# Patient Record
Sex: Female | Born: 1950 | Race: White | Hispanic: No | Marital: Married | Smoking: Never smoker
Health system: Southern US, Community
[De-identification: ages and names within clinical notes are randomized; demographics above are authoritative.]

## PROBLEM LIST (undated history)

## (undated) DIAGNOSIS — M1990 Unspecified osteoarthritis, unspecified site: Secondary | ICD-10-CM

## (undated) DIAGNOSIS — K219 Gastro-esophageal reflux disease without esophagitis: Secondary | ICD-10-CM

## (undated) DIAGNOSIS — I2510 Atherosclerotic heart disease of native coronary artery without angina pectoris: Secondary | ICD-10-CM

## (undated) DIAGNOSIS — E039 Hypothyroidism, unspecified: Secondary | ICD-10-CM

## (undated) DIAGNOSIS — T7840XA Allergy, unspecified, initial encounter: Secondary | ICD-10-CM

## (undated) DIAGNOSIS — Z923 Personal history of irradiation: Secondary | ICD-10-CM

## (undated) DIAGNOSIS — I219 Acute myocardial infarction, unspecified: Secondary | ICD-10-CM

## (undated) DIAGNOSIS — C50919 Malignant neoplasm of unspecified site of unspecified female breast: Secondary | ICD-10-CM

## (undated) DIAGNOSIS — E785 Hyperlipidemia, unspecified: Secondary | ICD-10-CM

## (undated) DIAGNOSIS — I10 Essential (primary) hypertension: Secondary | ICD-10-CM

## (undated) HISTORY — DX: Gastro-esophageal reflux disease without esophagitis: K21.9

## (undated) HISTORY — PX: CARDIAC CATHETERIZATION: SHX172

## (undated) HISTORY — DX: Acute myocardial infarction, unspecified: I21.9

## (undated) HISTORY — DX: Hyperlipidemia, unspecified: E78.5

## (undated) HISTORY — DX: Morbid (severe) obesity due to excess calories: E66.01

## (undated) HISTORY — DX: Atherosclerotic heart disease of native coronary artery without angina pectoris: I25.10

## (undated) HISTORY — PX: HAND SURGERY: SHX662

## (undated) HISTORY — DX: Allergy, unspecified, initial encounter: T78.40XA

## (undated) HISTORY — DX: Essential (primary) hypertension: I10

---

## 1998-07-11 DIAGNOSIS — I219 Acute myocardial infarction, unspecified: Secondary | ICD-10-CM

## 1998-07-11 HISTORY — DX: Acute myocardial infarction, unspecified: I21.9

## 2004-06-10 HISTORY — PX: COLONOSCOPY: SHX174

## 2004-08-31 ENCOUNTER — Ambulatory Visit: Payer: Self-pay | Admitting: Internal Medicine

## 2005-06-11 ENCOUNTER — Other Ambulatory Visit: Payer: Self-pay

## 2005-06-13 ENCOUNTER — Ambulatory Visit: Payer: Self-pay | Admitting: Specialist

## 2006-10-24 ENCOUNTER — Ambulatory Visit: Payer: Self-pay | Admitting: Gastroenterology

## 2008-06-15 ENCOUNTER — Ambulatory Visit: Payer: Self-pay | Admitting: Specialist

## 2008-07-18 ENCOUNTER — Encounter: Payer: Self-pay | Admitting: Specialist

## 2010-02-05 ENCOUNTER — Ambulatory Visit: Payer: Self-pay | Admitting: Family Medicine

## 2011-02-19 ENCOUNTER — Ambulatory Visit: Payer: Self-pay | Admitting: Family Medicine

## 2012-02-21 ENCOUNTER — Ambulatory Visit: Payer: Self-pay | Admitting: Family Medicine

## 2012-06-10 HISTORY — PX: BREAST LUMPECTOMY WITH MAMMOSITE CAVITY EVALUATION DEVICE: SHX5596

## 2013-02-24 ENCOUNTER — Ambulatory Visit: Payer: Self-pay | Admitting: Family Medicine

## 2013-03-16 ENCOUNTER — Ambulatory Visit: Payer: Self-pay | Admitting: Family Medicine

## 2013-03-22 ENCOUNTER — Ambulatory Visit: Payer: Self-pay | Admitting: Family Medicine

## 2013-03-22 HISTORY — PX: BREAST BIOPSY: SHX20

## 2013-03-29 ENCOUNTER — Ambulatory Visit (INDEPENDENT_AMBULATORY_CARE_PROVIDER_SITE_OTHER): Payer: 59 | Admitting: General Surgery

## 2013-03-29 ENCOUNTER — Other Ambulatory Visit: Payer: 59

## 2013-03-29 ENCOUNTER — Encounter: Payer: Self-pay | Admitting: General Surgery

## 2013-03-29 VITALS — BP 132/78 | HR 72 | Resp 14 | Ht 62.0 in | Wt 228.0 lb

## 2013-03-29 DIAGNOSIS — D0512 Intraductal carcinoma in situ of left breast: Secondary | ICD-10-CM

## 2013-03-29 DIAGNOSIS — D0590 Unspecified type of carcinoma in situ of unspecified breast: Secondary | ICD-10-CM

## 2013-03-29 NOTE — Progress Notes (Signed)
Patient ID: Annette Porter, female   DOB: May 22, 1951, 62 y.o.   MRN: [id number]  Chief Complaint  Patient presents with  . Follow-up    mammogram    HPI Annette Porter is a 62 y.o. female who presents for a breast evaluation. The most recent mammogram was done on 02/20/13. 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 Patient does perform regular self breast checks and gets regular mammograms done. Patient reports no prior breast problems. She denies feeling any lumps or tenderness. No family history of breast cancer. The patient had been notified of the pathology results by the radiologist.  The patient is accompanied today by her sister, Holly Bodily who was present for the interview and exam.  HPI   Past Medical History  Diagnosis Date  . Myocardial infarction 07/1998    Past Surgical History  Procedure Laterality Date  . Breast biopsy Left 03/22/13     stereo    No family history on file.  Social History History  Substance Use Topics  . Smoking status: Never Smoker   . Smokeless tobacco: Never Used  . Alcohol Use: No    No Known Allergies  Current Outpatient Prescriptions  Medication Sig Dispense Refill  . aspirin 81 MG tablet Take 81 mg by mouth daily.      . Cholecalciferol (VITAMIN D3) 1000 UNITS CAPS Take 1 capsule by mouth daily.      Marland Kitchen docusate sodium (COLACE) 100 MG capsule Take 100 mg by mouth daily.      Marland Kitchen ibuprofen (ADVIL,MOTRIN) 800 MG tablet Take 800 mg by mouth every 8 (eight) hours as needed for pain.      . isosorbide mononitrate (IMDUR) 60 MG 24 hr tablet Take 1 tablet by mouth daily.      . metoprolol succinate (TOPROL-XL) 50 MG 24 hr tablet Take 1 tablet by mouth daily.      . niacin (NIASPAN) 1000 MG CR tablet Take 1,000 mg by mouth at bedtime.      . nitroGLYCERIN (NITROSTAT) 0.4 MG SL tablet Place 0.4 mg under the tongue every 5 (five) minutes as needed for chest pain.      . ranitidine (ZANTAC) 75 MG tablet  Take 75 mg by mouth at bedtime.      . senna-docusate (STOOL SOFTENER & LAXATIVE) 8.6-50 MG per tablet Take 1 tablet by mouth daily.      Marland Kitchen SYNTHROID 125 MCG tablet Take 1 tablet by mouth daily.      . vitamin B-12 (CYANOCOBALAMIN) 250 MCG tablet Take 250 mcg by mouth daily.      Marland Kitchen ZETIA 10 MG tablet Take 1 tablet by mouth daily.      . CRESTOR 40 MG tablet Take 1 tablet by mouth daily.       No current facility-administered medications for this visit.    Review of Systems Review of Systems  Constitutional: Negative.   Respiratory: Negative.   Cardiovascular: Negative.     Blood pressure 132/78, pulse 72, resp. rate 14, height 5\' 2"  (1.575 m), weight 228 lb (103.42 kg).  Physical Exam Physical Exam  Constitutional: She is oriented to person, place, and time. She appears well-developed and well-nourished.  Neck: Neck supple.  Cardiovascular: Normal rate, regular rhythm and normal heart sounds.   Pulmonary/Chest: Effort normal and breath sounds normal. Right breast exhibits no inverted nipple, no mass, no nipple discharge, no skin change and no tenderness. Left breast exhibits no inverted nipple, no mass,  no nipple discharge, no skin change and no tenderness.  Left breast larger than right. Biopsy site at 3o'clock left breast, and a tape burn at 12o'clock left breast.   Lymphadenopathy:    She has no cervical adenopathy.  Neurological: She is alert and oriented to person, place, and time.    Data Reviewed Screening mammogram dated 02/24/2013 showed a small round nodule as well as a new area of calcifications in the left breast which additional views were requested.  Focal spot compression views dated 03/16/2013 confirmed suspicious microcalcifications. BI-RAD-4. Ultrasound identified a small 8 mm simple cyst in the 10:00 position of the breast.  Stereotactic biopsy was completed 03/22/2013 confirming DCIS. Postbiopsy mammograms were reported by the radiologist are not available for  review at this time.  Pathology showed high-grade DCIS with comedonecrosis as well as fibrocystic changes. No evidence of invasive carcinoma.  ER: Less than 1%. PR: 0%.  Ultrasound examination of the left breast was completed to determine if preoperative needle localization will be required. A 1.0 x 2.4 x 4.0 biopsy cavities are evident in the 3:00 position of the left breast 7 cm from the nipple. This is approximately 2.3 cm depth.  Assessment    High-grade DCIS of the left breast.    Plan    The indications for wide local excision followed by radiation therapy were reviewed. No indication for antiestrogen therapy.  The patient may be a candidate for partial breast radiation based on the final pathology.    Patient's surgery has been scheduled for 04-29-13 at [facility]. It is okay for patient to continue 81 mg aspirin.   [name] 03/30/2013, 9:59 AM

## 2013-03-29 NOTE — Patient Instructions (Signed)
Patient's surgery has been scheduled for 04-29-13 at [facility]. It is okay for patient to continue 81 mg aspirin.

## 2013-03-30 ENCOUNTER — Telehealth: Payer: Self-pay | Admitting: *Deleted

## 2013-03-30 ENCOUNTER — Other Ambulatory Visit: Payer: Self-pay | Admitting: General Surgery

## 2013-03-30 ENCOUNTER — Encounter: Payer: Self-pay | Admitting: General Surgery

## 2013-03-30 DIAGNOSIS — D0512 Intraductal carcinoma in situ of left breast: Secondary | ICD-10-CM

## 2013-03-30 DIAGNOSIS — D0510 Intraductal carcinoma in situ of unspecified breast: Secondary | ICD-10-CM | POA: Insufficient documentation

## 2013-03-30 NOTE — Telephone Encounter (Signed)
Message copied by Nicholes Mango on Tue Mar 30, 2013  3:49 PM ------      Message from: [place], Merrily Pew      Created: Tue Mar 30, 2013 10:12 AM       Patient will need brief OV for u/s (no charge) prior to Nov 20 to confirm biopsy cavity is still visible.  ------

## 2013-03-30 NOTE — Telephone Encounter (Signed)
Patient notified as instructed. She will come in for a pre-op visit on 04-26-13 at 9:15 am.

## 2013-04-21 ENCOUNTER — Ambulatory Visit: Payer: Self-pay | Admitting: General Surgery

## 2013-04-21 LAB — CBC WITH DIFFERENTIAL/PLATELET
Basophil #: 0 10*3/uL (ref 0.0–0.1)
Eosinophil #: 0.2 10*3/uL (ref 0.0–0.7)
Eosinophil %: 1.8 %
MCH: 30.1 pg (ref 26.0–34.0)
MCV: 89 fL (ref 80–100)
Monocyte #: 0.8 x10 3/mm (ref 0.2–0.9)
Monocyte %: 9.4 %
Neutrophil %: 67.5 %
Platelet: 208 10*3/uL (ref 150–440)

## 2013-04-21 LAB — BASIC METABOLIC PANEL
Anion Gap: 3 — ABNORMAL LOW (ref 7–16)
BUN: 13 mg/dL (ref 7–18)
Calcium, Total: 8.9 mg/dL (ref 8.5–10.1)
Creatinine: 0.81 mg/dL (ref 0.60–1.30)
EGFR (African American): >60
Glucose: 105 mg/dL — ABNORMAL HIGH (ref 65–99)
Potassium: 3.7 mmol/L (ref 3.5–5.1)
Sodium: 141 mmol/L (ref 136–145)

## 2013-04-26 ENCOUNTER — Ambulatory Visit (INDEPENDENT_AMBULATORY_CARE_PROVIDER_SITE_OTHER): Payer: 59 | Admitting: General Surgery

## 2013-04-26 ENCOUNTER — Ambulatory Visit: Payer: Self-pay

## 2013-04-26 ENCOUNTER — Encounter: Payer: Self-pay | Admitting: General Surgery

## 2013-04-26 VITALS — BP 178/76 | HR 70 | Resp 16 | Ht 62.0 in | Wt 226.0 lb

## 2013-04-26 DIAGNOSIS — D0512 Intraductal carcinoma in situ of left breast: Secondary | ICD-10-CM

## 2013-04-26 DIAGNOSIS — D0590 Unspecified type of carcinoma in situ of unspecified breast: Secondary | ICD-10-CM

## 2013-04-26 NOTE — Progress Notes (Signed)
Patient ID: Annette Porter, female   DOB: 1951-06-10, 62 y.o.   MRN: [id number]  Chief Complaint  Patient presents with  . Other    breast ultrasound left breast wide excision sheduled for 04/29/13.    HPI Annette Porter is a 62 y.o. female who presents for a left breast ultrasound. The patient has a left breast wide excision scheduled for 04/29/13. She denies any new complaints at this time.   Marland KitchenHPI  Past Medical History  Diagnosis Date  . Myocardial infarction 07/1998    Past Surgical History  Procedure Laterality Date  . Breast biopsy Left 03/22/13     stereo    History reviewed. No pertinent family history.  Social History History  Substance Use Topics  . Smoking status: Never Smoker   . Smokeless tobacco: Never Used  . Alcohol Use: No    No Known Allergies  Current Outpatient Prescriptions  Medication Sig Dispense Refill  . aspirin 81 MG tablet Take 81 mg by mouth daily.      Marland Kitchen atorvastatin (LIPITOR) 40 MG tablet Take 1 tablet by mouth daily.      . Cholecalciferol (VITAMIN D3) 1000 UNITS CAPS Take 1 capsule by mouth daily.      . CRESTOR 40 MG tablet Take 1 tablet by mouth daily.      Marland Kitchen docusate sodium (COLACE) 100 MG capsule Take 100 mg by mouth daily.      . isosorbide mononitrate (IMDUR) 60 MG 24 hr tablet Take 1 tablet by mouth daily.      . metoprolol succinate (TOPROL-XL) 50 MG 24 hr tablet Take 1 tablet by mouth daily.      . nitroGLYCERIN (NITROSTAT) 0.4 MG SL tablet Place 0.4 mg under the tongue every 5 (five) minutes as needed for chest pain.      . ranitidine (ZANTAC) 75 MG tablet Take 75 mg by mouth at bedtime.      . senna-docusate (STOOL SOFTENER & LAXATIVE) 8.6-50 MG per tablet Take 1 tablet by mouth daily.      Marland Kitchen SYNTHROID 125 MCG tablet Take 1 tablet by mouth daily.      . vitamin B-12 (CYANOCOBALAMIN) 250 MCG tablet Take 250 mcg by mouth daily.      Marland Kitchen ZETIA 10 MG tablet Take 1 tablet by mouth daily.       No current facility-administered  medications for this visit.    Review of Systems Review of Systems  Constitutional: Negative.   Respiratory: Negative.   Cardiovascular: Negative.     Blood pressure 178/76, pulse 70, resp. rate 16, height 5\' 2"  (1.575 m), weight 226 lb (102.513 kg).  Physical Exam Physical Exam  Constitutional: She is oriented to person, place, and time. She appears well-developed and well-nourished.  Neck: Neck supple. No thyromegaly present.  Cardiovascular: Normal rate, regular rhythm and normal heart sounds.   No murmur heard. Pulmonary/Chest: Effort normal and breath sounds normal.  Lymphadenopathy:    She has no cervical adenopathy.  Neurological: She is alert and oriented to person, place, and time.  Skin: Skin is warm and dry.    Data Reviewed  Ultrasound completed this morning (no images, no charge) show the previously noted cavity to be completely resolved. No clear ultrasound evidence of the previous biopsy site.  Assessment    Left breast DCIS.    Plan    Arrangements will made for needle localization prior to the procedure. The needle localization procedure was reviewed with the patient. We'll plan for  wide excision, possible mastoplasty. This be completed as an outpatient.       [name] 04/26/2013, 9:25 AM

## 2013-04-26 NOTE — Addendum Note (Signed)
Addended by: [name] on: 04/26/2013 09:36 AM   Modules accepted: Orders

## 2013-04-26 NOTE — Patient Instructions (Signed)
Patient has surgery scheduled for 04/29/13 for left breast.

## 2013-04-29 ENCOUNTER — Ambulatory Visit: Payer: Self-pay | Admitting: General Surgery

## 2013-04-29 DIAGNOSIS — C50419 Malignant neoplasm of upper-outer quadrant of unspecified female breast: Secondary | ICD-10-CM

## 2013-04-29 HISTORY — PX: BREAST SURGERY: SHX581

## 2013-04-29 HISTORY — PX: BREAST LUMPECTOMY: SHX2

## 2013-04-30 ENCOUNTER — Encounter: Payer: Self-pay | Admitting: General Surgery

## 2013-04-30 LAB — PATHOLOGY REPORT

## 2013-05-03 ENCOUNTER — Encounter: Payer: Self-pay | Admitting: General Surgery

## 2013-05-05 ENCOUNTER — Ambulatory Visit (INDEPENDENT_AMBULATORY_CARE_PROVIDER_SITE_OTHER): Payer: 59 | Admitting: *Deleted

## 2013-05-05 DIAGNOSIS — D0590 Unspecified type of carcinoma in situ of unspecified breast: Secondary | ICD-10-CM

## 2013-05-05 DIAGNOSIS — D0512 Intraductal carcinoma in situ of left breast: Secondary | ICD-10-CM

## 2013-05-05 NOTE — Progress Notes (Addendum)
Patient here today for follow up post left breast wide excision of Left breast lesion with wire localization on 04-29-13.   Steristrip in place and aware it may come off in one week.  Minimal bruising noted.  Still a little sore.  Follow up as scheduled.

## 2013-05-05 NOTE — Patient Instructions (Signed)
The patient is aware that a heating pad may be used for comfort as needed. 

## 2013-05-12 ENCOUNTER — Ambulatory Visit (INDEPENDENT_AMBULATORY_CARE_PROVIDER_SITE_OTHER): Payer: 59 | Admitting: General Surgery

## 2013-05-12 ENCOUNTER — Other Ambulatory Visit: Payer: 59

## 2013-05-12 ENCOUNTER — Encounter: Payer: Self-pay | Admitting: General Surgery

## 2013-05-12 VITALS — BP 140/78 | HR 78 | Resp 16 | Ht 61.0 in | Wt 229.0 lb

## 2013-05-12 DIAGNOSIS — N63 Unspecified lump in unspecified breast: Secondary | ICD-10-CM

## 2013-05-12 NOTE — Patient Instructions (Addendum)
Follow up appointment to be announced.  Patient has been scheduled for an appointment to see Dr. Carmina Miller at the [facility] for tomorrow, 05-13-13 at 3 pm. She is aware of date, time, and instructions.

## 2013-05-12 NOTE — Progress Notes (Signed)
Patient ID: Annette Porter, female   DOB: 1950/10/04, 62 y.o.   MRN: [id number]  Chief Complaint  Patient presents with  . Routine Post Op    left breast wide excision    HPI Annette Porter is a 62 y.o. female here today for her post op left breast wide excision done on 04/29/13./ patient states she is doing well with no problems. HPI  Past Medical History  Diagnosis Date  . Myocardial infarction 07/1998  . 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 Negative margins on resection.    Past Surgical History  Procedure Laterality Date  . Breast biopsy Left 03/22/13     stereo  . Breast surgery Left 04/29/13    wide excision    No family history on file.  Social History History  Substance Use Topics  . Smoking status: Never Smoker   . Smokeless tobacco: Never Used  . Alcohol Use: No    Allergies  Allergen Reactions  . Hydrocodone Itching    Current Outpatient Prescriptions  Medication Sig Dispense Refill  . aspirin 81 MG tablet Take 81 mg by mouth daily.      Marland Kitchen atorvastatin (LIPITOR) 40 MG tablet Take 1 tablet by mouth daily.      . Cholecalciferol (VITAMIN D3) 1000 UNITS CAPS Take 1 capsule by mouth daily.      . CRESTOR 40 MG tablet Take 1 tablet by mouth daily.      Marland Kitchen docusate sodium (COLACE) 100 MG capsule Take 100 mg by mouth daily.      . isosorbide mononitrate (IMDUR) 60 MG 24 hr tablet Take 1 tablet by mouth daily.      . metoprolol succinate (TOPROL-XL) 50 MG 24 hr tablet Take 1 tablet by mouth daily.      . nitroGLYCERIN (NITROSTAT) 0.4 MG SL tablet Place 0.4 mg under the tongue every 5 (five) minutes as needed for chest pain.      . ranitidine (ZANTAC) 75 MG tablet Take 75 mg by mouth at bedtime.      . senna-docusate (STOOL SOFTENER & LAXATIVE) 8.6-50 MG per tablet Take 1 tablet by mouth daily.      Marland Kitchen SYNTHROID 125 MCG tablet Take 1 tablet by  mouth daily.      . vitamin B-12 (CYANOCOBALAMIN) 250 MCG tablet Take 250 mcg by mouth daily.      Marland Kitchen ZETIA 10 MG tablet Take 1 tablet by mouth daily.       No current facility-administered medications for this visit.    Review of Systems Review of Systems  Constitutional: Negative.   Respiratory: Negative.   Cardiovascular: Negative.     Blood pressure 140/78, pulse 78, resp. rate 16, height 5\' 1"  (1.549 m), weight 229 lb (103.874 kg).  Physical Exam Physical Exam  Constitutional: She is oriented to person, place, and time. She appears well-developed and well-nourished.  Eyes: No scleral icterus.  Lymphadenopathy:    She has no cervical adenopathy.  Neurological: She is alert and oriented to person, place, and time.  Skin: Skin is warm and dry.  Examination of the breasts shows good healing well. No evidence of induration or thickening.  Data Reviewed Pathology showed negative margins, ER/p.r.n. Negative tumor.  Ultrasound examination shows a small cavity measuring less than 2 cm approximately 2 cm below the skin in the upper or quadrant of the left breast. She  may well be a MammoSite candidate.  Assessment    Doing well status post wide excision of DCIS.     Plan    Radiation oncology assessment will be obtained in the near future.       Patient has been scheduled for an appointment to see Dr. Carmina Miller at the [facility] for tomorrow, 05-13-13 at 3 pm. She is aware of date, time, and instructions.   Annette Porter 05/14/2013, 8:30 PM

## 2013-05-13 ENCOUNTER — Ambulatory Visit: Payer: Self-pay | Admitting: Radiation Oncology

## 2013-05-14 ENCOUNTER — Encounter: Payer: Self-pay | Admitting: General Surgery

## 2013-05-18 ENCOUNTER — Telehealth: Payer: Self-pay | Admitting: *Deleted

## 2013-05-18 MED ORDER — CEFADROXIL 500 MG PO CAPS: 500.0000 mg | ORAL_CAPSULE | Freq: Two times a day (BID) | ORAL | Status: DC

## 2013-05-18 NOTE — Telephone Encounter (Signed)
Mammosite schedule reviewed with the patient Placement   05-20-13 at 830am  at [facility] Scan 05-21-13 Treat 05-24-13 to 05-28-13  Aware Toniann Fail will be calling her for more details Aware of ATB and directions reviewed. Aware no showers and to wear her bra while mammosite in place. Pt agrees.

## 2013-05-20 ENCOUNTER — Other Ambulatory Visit: Payer: 59

## 2013-05-20 ENCOUNTER — Ambulatory Visit (INDEPENDENT_AMBULATORY_CARE_PROVIDER_SITE_OTHER): Payer: 59 | Admitting: General Surgery

## 2013-05-20 VITALS — BP 166/74 | HR 70 | Resp 16 | Ht 61.0 in | Wt 231.0 lb

## 2013-05-20 DIAGNOSIS — D0592 Unspecified type of carcinoma in situ of left breast: Secondary | ICD-10-CM

## 2013-05-20 DIAGNOSIS — D0590 Unspecified type of carcinoma in situ of unspecified breast: Secondary | ICD-10-CM

## 2013-05-20 HISTORY — PX: BREAST MAMMOSITE: SHX5264

## 2013-05-20 NOTE — Patient Instructions (Signed)
Patient care kit given to patient.  Instructed no showers, sponge bath while mammosite in place, take antibiotic. [facility] with [facility] as arranged. Discussed wearing your bra for support at all times. 

## 2013-05-20 NOTE — Progress Notes (Signed)
This is a 62 year old female here today for her left breast mammosite placement.  The patient has been evaluated by radiation oncology and felt to be a candidate for partial breast radiation.  The procedure was reviewed with the patient. The risks and benefits were discussed.  She did make use of Duricef 500 mg prior to the procedure as requested and we'll continue this until the balloon was removed.  Ultrasound examination of the 3:00 position of left breast in the far lateral position was completed. A small cavity was identified approximately 2 cm deep to the skin. 10 cc of 0.5% Xylocaine with 0.25% Marcaine was instilled. Chlor prep was applied to the skin. Under ultrasound guidance an 8-gauge trocar was advanced in the area. A test balloon was inflated to 40 cm with an adequate skin buffer of 2.16 cm. The trial balloon was removed and placed with the 4-5 cm spherical balloon inflated to 50 cm with a mixture of saline and Omnipaque. The balloon was symmetrical on inflation and the skin distance was measured at 2.3 cm. Bacitracin was applied to the balloon exit site followed by a gauze dressing. Instructions were provided regarding dressing care.  The patient is scheduled for stimulation tomorrow. We'll plan for followup examination her in 2 weeks.

## 2013-06-01 ENCOUNTER — Ambulatory Visit (INDEPENDENT_AMBULATORY_CARE_PROVIDER_SITE_OTHER): Payer: 59 | Admitting: General Surgery

## 2013-06-01 ENCOUNTER — Encounter: Payer: Self-pay | Admitting: General Surgery

## 2013-06-01 VITALS — BP 140/80 | HR 72 | Resp 14 | Ht 61.0 in | Wt 221.0 lb

## 2013-06-01 DIAGNOSIS — D0590 Unspecified type of carcinoma in situ of unspecified breast: Secondary | ICD-10-CM

## 2013-06-01 DIAGNOSIS — D0592 Unspecified type of carcinoma in situ of left breast: Secondary | ICD-10-CM

## 2013-06-01 NOTE — Patient Instructions (Signed)
Patient to return in one month. 

## 2013-06-01 NOTE — Progress Notes (Signed)
Patient ID: Annette Porter, female   DOB: [birth year], 62 y.o.   MRN: [id number]  Chief Complaint  Patient presents with  . Follow-up    mammosite    HPI Annette Porter is a 62 y.o. female here today following up from her mammosite placement which was done on 05/20/13. Patient had the mammosite removed on 05/28/13. She states she is doing well. Minimal pain with balloon removal. HPI  Past Medical History  Diagnosis Date  . Myocardial infarction 07/1998  . 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 Negative margins on resection.    Past Surgical History  Procedure Laterality Date  . Breast biopsy Left 03/22/13     stereo  . Breast surgery Left 04/29/13    wide excision  . Breast mammosite  05/20/13    No family history on file.  Social History History  Substance Use Topics  . Smoking status: Never Smoker   . Smokeless tobacco: Never Used  . Alcohol Use: No    Allergies  Allergen Reactions  . Hydrocodone Itching    Current Outpatient Prescriptions  Medication Sig Dispense Refill  . aspirin 81 MG tablet Take 81 mg by mouth daily.      Marland Kitchen atorvastatin (LIPITOR) 40 MG tablet Take 1 tablet by mouth daily.      . cefadroxil (DURICEF) 500 MG capsule Take 1 capsule (500 mg total) by mouth 2 (two) times daily.  20 capsule  0  . Cholecalciferol (VITAMIN D3) 1000 UNITS CAPS Take 1 capsule by mouth daily.      . CRESTOR 40 MG tablet Take 1 tablet by mouth daily.      Marland Kitchen docusate sodium (COLACE) 100 MG capsule Take 100 mg by mouth daily.      Marland Kitchen ibuprofen (ADVIL,MOTRIN) 800 MG tablet Take 1 tablet by mouth as needed.      . isosorbide mononitrate (IMDUR) 60 MG 24 hr tablet Take 1 tablet by mouth daily.      . metoprolol succinate (TOPROL-XL) 50 MG 24 hr tablet Take 1 tablet by mouth daily.      . niacin (NIASPAN) 1000 MG CR tablet Take 1 tablet by mouth daily.      .  nitroGLYCERIN (NITROSTAT) 0.4 MG SL tablet Place 0.4 mg under the tongue every 5 (five) minutes as needed for chest pain.      . ranitidine (ZANTAC) 75 MG tablet Take 75 mg by mouth at bedtime.      . senna-docusate (STOOL SOFTENER & LAXATIVE) 8.6-50 MG per tablet Take 1 tablet by mouth daily.      Marland Kitchen SYNTHROID 125 MCG tablet Take 1 tablet by mouth daily.      . vitamin B-12 (CYANOCOBALAMIN) 250 MCG tablet Take 250 mcg by mouth daily.      Marland Kitchen ZETIA 10 MG tablet Take 1 tablet by mouth daily.       No current facility-administered medications for this visit.    Review of Systems Review of Systems  Blood pressure 140/80, pulse 72, resp. rate 14, height 5\' 1"  (1.549 m), weight 221 lb (100.245 kg).  Physical Exam Physical Exam  Constitutional: She is oriented to person, place, and time. She appears well-developed and well-nourished.  Eyes: No scleral icterus.  Pulmonary/Chest:  Left breast wide incision look clean and healing well.   Lymphadenopathy:    She has no cervical adenopathy.  Neurological: She  is alert and oriented to person, place, and time.  Skin: Skin is warm.    Data Reviewed DCIS, ER/PR negative.  Assessment    Doing well status post MammoSite radiation treatment.     Plan    We'll plan for a followup examination one month. Adjuvant tamoxifen is not indicated based on her ER status.        Annette Porter 06/01/2013, 9:01 PM

## 2013-06-10 ENCOUNTER — Ambulatory Visit: Payer: Self-pay | Admitting: Radiation Oncology

## 2013-06-10 HISTORY — PX: BREAST LUMPECTOMY WITH MAMMOSITE CAVITY EVALUATION DEVICE: SHX5596

## 2013-06-30 ENCOUNTER — Encounter: Payer: Self-pay | Admitting: General Surgery

## 2013-06-30 ENCOUNTER — Ambulatory Visit (INDEPENDENT_AMBULATORY_CARE_PROVIDER_SITE_OTHER): Payer: 59 | Admitting: General Surgery

## 2013-06-30 VITALS — BP 138/78 | HR 76 | Resp 12 | Ht 61.0 in | Wt 228.0 lb

## 2013-06-30 DIAGNOSIS — C50919 Malignant neoplasm of unspecified site of unspecified female breast: Secondary | ICD-10-CM

## 2013-06-30 NOTE — Progress Notes (Signed)
Patient ID: Annette Porter, female   DOB: [birth year], 63 y.o.   MRN: [id number]  Chief Complaint  Patient presents with  . Follow-up    breast cancer    HPI Annette Porter is a 63 y.o. female here today following up for her one month breast cancer. Patient states she is doing well. No new problems at this time. HPI  Past Medical History  Diagnosis Date  . Myocardial infarction 07/1998  . 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 Negative margins on resection.    Past Surgical History  Procedure Laterality Date  . Breast biopsy Left 03/22/13     stereo  . Breast surgery Left 04/29/13    wide excision  . Breast mammosite  05/20/13    No family history on file.  Social History History  Substance Use Topics  . Smoking status: Never Smoker   . Smokeless tobacco: Never Used  . Alcohol Use: No    Allergies  Allergen Reactions  . Hydrocodone Itching    Current Outpatient Prescriptions  Medication Sig Dispense Refill  . aspirin 81 MG tablet Take 81 mg by mouth daily.      Marland Kitchen atorvastatin (LIPITOR) 40 MG tablet Take 1 tablet by mouth daily.      . cefadroxil (DURICEF) 500 MG capsule Take 1 capsule (500 mg total) by mouth 2 (two) times daily.  20 capsule  0  . Cholecalciferol (VITAMIN D3) 1000 UNITS CAPS Take 1 capsule by mouth daily.      . CRESTOR 40 MG tablet Take 1 tablet by mouth daily.      Marland Kitchen docusate sodium (COLACE) 100 MG capsule Take 100 mg by mouth daily.      Marland Kitchen ibuprofen (ADVIL,MOTRIN) 800 MG tablet Take 1 tablet by mouth as needed.      . isosorbide mononitrate (IMDUR) 60 MG 24 hr tablet Take 1 tablet by mouth daily.      . metoprolol succinate (TOPROL-XL) 50 MG 24 hr tablet Take 1 tablet by mouth daily.      . niacin (NIASPAN) 1000 MG CR tablet Take 1 tablet by mouth daily.      . nitroGLYCERIN (NITROSTAT) 0.4 MG SL tablet Place 0.4 mg under the  tongue every 5 (five) minutes as needed for chest pain.      . ranitidine (ZANTAC) 75 MG tablet Take 75 mg by mouth at bedtime.      . senna-docusate (STOOL SOFTENER & LAXATIVE) 8.6-50 MG per tablet Take 1 tablet by mouth daily.      Marland Kitchen SYNTHROID 125 MCG tablet Take 1 tablet by mouth daily.      . vitamin B-12 (CYANOCOBALAMIN) 250 MCG tablet Take 250 mcg by mouth daily.      Marland Kitchen ZETIA 10 MG tablet Take 1 tablet by mouth daily.       No current facility-administered medications for this visit.    Review of Systems Review of Systems  Constitutional: Negative.   Respiratory: Negative.   Cardiovascular: Negative.     Blood pressure 138/78, pulse 76, resp. rate 12, height 5\' 1"  (1.549 m), weight 228 lb (103.42 kg).  Physical Exam Physical Exam  Constitutional: She is oriented to person, place, and time. She appears well-developed and well-nourished.  Eyes: Conjunctivae are normal.  Neck: Neck supple.  Cardiovascular: Normal rate, regular rhythm and normal heart sounds.   Pulmonary/Chest: Breath sounds normal.  Well healed wounds at 3 o'clock.  Lymphadenopathy:    She has no cervical adenopathy.    She has no axillary adenopathy.  Neurological: She is alert and oriented to person, place, and time.  Skin: Skin is warm and dry.      Assessment    Doing well status post wide excision and partial breast radiation for DCIS of the right breast.    Plan    We'll plan for a followup examination in the right breast diagnostic mammogram in 4 months. The patient's tumor was ER/PR negative, therefore antiestrogen therapy is not indicated.       Robert Bellow 07/01/2013, 4:24 PM

## 2013-06-30 NOTE — Patient Instructions (Signed)
Patient to return in May 2015 Left breast diagnotic mammogram.

## 2013-07-01 ENCOUNTER — Encounter: Payer: Self-pay | Admitting: General Surgery

## 2013-10-13 ENCOUNTER — Ambulatory Visit: Payer: Self-pay | Admitting: Radiation Oncology

## 2013-10-14 ENCOUNTER — Encounter: Payer: Self-pay | Admitting: General Surgery

## 2013-10-15 ENCOUNTER — Telehealth: Payer: Self-pay | Admitting: *Deleted

## 2013-10-15 NOTE — Telephone Encounter (Signed)
Message copied by Chrystie Nose on Fri Oct 15, 2013  8:11 AM ------      Message from: [place], [place] W      Created: Thu Oct 14, 2013  9:17 PM       Please notify the patient I reviewed the radiology report. I suspect these are changes related to the surgery and radiation. No cause for alarm. We will check and ultrasound of the breast at her planned follow up early next next month. If she is anxious, see if we can arrange an earlier evaluation. Thanks.       ----- Message -----         From: Darrin Nipper, CMA         Sent: 10/14/2013   3:12 PM           To: Robert Bellow, MD                   ------

## 2013-10-15 NOTE — Telephone Encounter (Signed)
Notified patient as instructed, patient pleased. Discussed follow-up appointments, patient agrees  

## 2013-11-03 ENCOUNTER — Ambulatory Visit: Payer: 59 | Admitting: General Surgery

## 2013-11-09 ENCOUNTER — Encounter: Payer: Self-pay | Admitting: General Surgery

## 2013-11-09 ENCOUNTER — Ambulatory Visit (INDEPENDENT_AMBULATORY_CARE_PROVIDER_SITE_OTHER): Payer: 59 | Admitting: General Surgery

## 2013-11-09 ENCOUNTER — Other Ambulatory Visit: Payer: 59

## 2013-11-09 VITALS — BP 176/86 | HR 74 | Resp 16 | Ht 63.0 in | Wt 231.0 lb

## 2013-11-09 DIAGNOSIS — Z853 Personal history of malignant neoplasm of breast: Secondary | ICD-10-CM | POA: Insufficient documentation

## 2013-11-09 DIAGNOSIS — C50919 Malignant neoplasm of unspecified site of unspecified female breast: Secondary | ICD-10-CM

## 2013-11-09 DIAGNOSIS — N63 Unspecified lump in unspecified breast: Secondary | ICD-10-CM

## 2013-11-09 NOTE — Patient Instructions (Signed)
Continue self breast exams. Call office for any new breast issues or concerns. 

## 2013-11-09 NOTE — Progress Notes (Addendum)
Patient ID: Annette Porter, female   DOB: [birth year], 63 y.o.   MRN: [id number]  Chief Complaint  Patient presents with  . Follow-up    mammogram    HPI Annette Porter is a 62 y.o. female.  who presents for her follow up mammogram and breast evaluation. The most recent mammogram was done on 10-14-13.  Patient does perform regular self breast checks and gets regular mammograms done.   Denies any new breast issues. Follow up with Dr Baruch Gouty was May 2015.   HPI  Past Medical History  Diagnosis Date  . Myocardial infarction 07/1998  . GERD (gastroesophageal reflux disease)   . 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 Negative margins on resection.    Past Surgical History  Procedure Laterality Date  . Breast biopsy Left 03/22/13     stereo  . Breast surgery Left 04/29/13    wide excision  . Breast mammosite  05/20/13    No family history on file.  Social History History  Substance Use Topics  . Smoking status: Never Smoker   . Smokeless tobacco: Never Used  . Alcohol Use: No    Allergies  Allergen Reactions  . Hydrocodone Itching    Current Outpatient Prescriptions  Medication Sig Dispense Refill  . aspirin 81 MG tablet Take 81 mg by mouth daily.      Marland Kitchen atorvastatin (LIPITOR) 40 MG tablet Take 1 tablet by mouth daily.      . Cholecalciferol (VITAMIN D3) 1000 UNITS CAPS Take 1 capsule by mouth daily.      . CRESTOR 40 MG tablet Take 1 tablet by mouth daily.      Marland Kitchen docusate sodium (COLACE) 100 MG capsule Take 100 mg by mouth daily.      Marland Kitchen ibuprofen (ADVIL,MOTRIN) 800 MG tablet Take 1 tablet by mouth as needed.      . isosorbide mononitrate (IMDUR) 60 MG 24 hr tablet Take 1 tablet by mouth daily.      . metoprolol succinate (TOPROL-XL) 50 MG 24 hr tablet Take 1 tablet by mouth daily.      . niacin (NIASPAN) 1000 MG CR tablet Take 1 tablet by mouth daily.       . nitroGLYCERIN (NITROSTAT) 0.4 MG SL tablet Place 0.4 mg under the tongue every 5 (five) minutes as needed for chest pain.      . ranitidine (ZANTAC) 75 MG tablet Take 75 mg by mouth at bedtime.      . senna-docusate (STOOL SOFTENER & LAXATIVE) 8.6-50 MG per tablet Take 1 tablet by mouth daily.      Marland Kitchen SYNTHROID 125 MCG tablet Take 1 tablet by mouth daily.      . vitamin B-12 (CYANOCOBALAMIN) 250 MCG tablet Take 250 mcg by mouth daily.      Marland Kitchen ZETIA 10 MG tablet Take 1 tablet by mouth daily.       No current facility-administered medications for this visit.    Review of Systems Review of Systems  Constitutional: Negative.   Respiratory: Negative.   Cardiovascular: Negative.     Blood pressure 176/86, pulse 74, resp. rate 16, height 5\' 3"  (1.6 m), weight 231 lb (104.781 kg).  Physical Exam Physical Exam  Constitutional: She is oriented to person, place, and time. She appears well-developed and well-nourished.  Neck: Neck supple.  Cardiovascular: Normal rate, regular rhythm and normal heart sounds.  Pulmonary/Chest: Effort normal and breath sounds normal. Right breast exhibits no inverted nipple, no mass, no nipple discharge, no skin change and no tenderness. Left breast exhibits no inverted nipple, no mass, no nipple discharge, no skin change and no tenderness.  Well healed incision at 3 o'clock left breast  Lymphadenopathy:    She has no cervical adenopathy.    She has no axillary adenopathy.  Neurological: She is alert and oriented to person, place, and time.  Skin: Skin is warm and dry.    Data Reviewed Bilateral mammograms completed at [facility] dated Oct 14, 2013 were reviewed. An 8 mm partially circumscribed mass in the lower inner quadrant left breast was identified. Changes in the upper outer quadrant of the breast are consistent with previous surgery and radiation. BI-RAD-0. This is the patient's first posttreatment exam.  Ultrasound examination the left breast in  the 8 o'clock position 10 cm from the nipple showed a well-circumscribed oval mass measuring 0.34 x 0.6 x 0.75 cm corresponding to the mammographic abnormality. Faint acoustic enhancement was noted. The patient was amenable to aspiration. 1 cc of 1% plain Xylocaine was utilized and a 22-gauge needle was passed through the lesion. There was modest decreased in volume but not complete resolution. Slides x2 were prepared for cytology. BI-RAD-3.  Assessment    Benign breast exam. Complex cystic nodule in the lower quadrant left breast.     Plan    The patient will be contacted when cytologies available.         Follow up in 6 months, pending cytology report.   PCP: Golden Pop Dr Brent Bulla Dawsyn Ramsaran 11/09/2013, 8:04 PM

## 2013-11-10 LAB — FINE-NEEDLE ASPIRATION

## 2013-11-11 ENCOUNTER — Other Ambulatory Visit: Payer: 59

## 2013-11-11 ENCOUNTER — Other Ambulatory Visit: Payer: Self-pay | Admitting: General Surgery

## 2013-11-11 ENCOUNTER — Encounter: Payer: Self-pay | Admitting: General Surgery

## 2013-11-11 ENCOUNTER — Telehealth: Payer: Self-pay | Admitting: General Surgery

## 2013-11-11 ENCOUNTER — Ambulatory Visit (INDEPENDENT_AMBULATORY_CARE_PROVIDER_SITE_OTHER): Payer: 59 | Admitting: General Surgery

## 2013-11-11 VITALS — BP 186/86 | HR 82 | Resp 14 | Ht 63.0 in | Wt 230.0 lb

## 2013-11-11 DIAGNOSIS — R928 Other abnormal and inconclusive findings on diagnostic imaging of breast: Secondary | ICD-10-CM

## 2013-11-11 DIAGNOSIS — N63 Unspecified lump in unspecified breast: Secondary | ICD-10-CM

## 2013-11-11 HISTORY — PX: BREAST BIOPSY: SHX20

## 2013-11-11 NOTE — Progress Notes (Signed)
Patient ID: Annette Porter, female   DOB: 06-23-1950, 63 y.o.   MRN: [id number]  Chief Complaint  Patient presents with  . Procedure    left breast biopsy    HPI Annette Porter is a 63 y.o. female.  Here today for left breast biopsy. HPI  Past Medical History  Diagnosis Date  . Myocardial infarction 07/1998  . GERD (gastroesophageal reflux disease)   . 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 Negative margins on resection.    Past Surgical History  Procedure Laterality Date  . Breast biopsy Left 03/22/13     stereo  . Breast surgery Left 04/29/13    wide excision  . Breast mammosite  05/20/13    No family history on file.  Social History History  Substance Use Topics  . Smoking status: Never Smoker   . Smokeless tobacco: Never Used  . Alcohol Use: No    Allergies  Allergen Reactions  . Hydrocodone Itching    Current Outpatient Prescriptions  Medication Sig Dispense Refill  . aspirin 81 MG tablet Take 81 mg by mouth daily.      Marland Kitchen atorvastatin (LIPITOR) 40 MG tablet Take 1 tablet by mouth daily.      . Cholecalciferol (VITAMIN D3) 1000 UNITS CAPS Take 1 capsule by mouth daily.      . CRESTOR 40 MG tablet Take 1 tablet by mouth daily.      Marland Kitchen docusate sodium (COLACE) 100 MG capsule Take 100 mg by mouth daily.      Marland Kitchen ibuprofen (ADVIL,MOTRIN) 800 MG tablet Take 1 tablet by mouth as needed.      . isosorbide mononitrate (IMDUR) 60 MG 24 hr tablet Take 1 tablet by mouth daily.      . metoprolol succinate (TOPROL-XL) 50 MG 24 hr tablet Take 1 tablet by mouth daily.      . niacin (NIASPAN) 1000 MG CR tablet Take 1 tablet by mouth daily.      . nitroGLYCERIN (NITROSTAT) 0.4 MG SL tablet Place 0.4 mg under the tongue every 5 (five) minutes as needed for chest pain.      . ranitidine (ZANTAC) 75 MG tablet Take 75 mg by mouth at bedtime.      . senna-docusate  (STOOL SOFTENER & LAXATIVE) 8.6-50 MG per tablet Take 1 tablet by mouth daily.      Marland Kitchen SYNTHROID 125 MCG tablet Take 1 tablet by mouth daily.      . vitamin B-12 (CYANOCOBALAMIN) 250 MCG tablet Take 250 mcg by mouth daily.      Marland Kitchen ZETIA 10 MG tablet Take 1 tablet by mouth daily.       No current facility-administered medications for this visit.    Review of Systems Review of Systems  Constitutional: Negative.   Respiratory: Negative.   Cardiovascular: Negative.     Blood pressure 186/86, pulse 82, resp. rate 14, height 5\' 3"  (1.6 m), weight 230 lb (104.327 kg).  Physical Exam Physical Exam  Data Reviewed Left breast mammogram dated Oct 14, 2013 completed at [facility] described an 8 mm partially circumscribed area in the lower inner quadrant of the left breast which was felt to be new compared to prior examinations of 2013. Additional imaging was suggested. Postlumpectomy changes were identified in the upper outer quadrant. BI-RAD-0.  Ultrasound examination of the left breast in the lower inner quadrant was completed. A well-circumscribed  slightly lobulated hypoechoic/anechoic area with a detrusor shadowing measuring 0.3 x 0.64 x 0.67 cm was identified at the 8 o'clock position 10 cm from the nipple. The patient was amenable to biopsy. BI-RAD-4.  10 cc of 0.5% Xylocaine with 0.25% Marcaine with 1-200,000 units of epinephrine was utilized to well-tolerated. Lower prep was applied to the skin. A 10-gauge Encore device was passed through the lesion and multiple core samples were obtained removing the area in its entirety. Scant bleeding was noted. A postbiopsy clip was placed. The skin defect was closed with benzoin and Steri-Strips followed by Telfa and Tegaderm dressing.  Written instructions were provided for postbiopsy wound care.  Assessment    New nodular density of the lower inner quadrant of the left breast. Normal post lumpectomy/radiation therapy changes in the upper outer  quadrant of the left breast.     Plan    The patient will be contacted when her pathology results are available.      PCP: Seth Bake Byrnett 11/12/2013, 9:18 PM

## 2013-11-11 NOTE — Telephone Encounter (Signed)
Notified results showed atypical cells, will come in at noon for vacuum biopsy.

## 2013-11-11 NOTE — Patient Instructions (Signed)

## 2013-11-12 ENCOUNTER — Telehealth: Payer: Self-pay | Admitting: General Surgery

## 2013-11-12 DIAGNOSIS — R928 Other abnormal and inconclusive findings on diagnostic imaging of breast: Secondary | ICD-10-CM | POA: Insufficient documentation

## 2013-11-12 NOTE — Telephone Encounter (Signed)
The patient was notified that the biopsy of the lower inner quadrant of the left breast completed yesterday did show invasive mammary carcinoma. She tolerated the biopsy procedure well. Arrangements are in place to get together the morning of 11/15/2013 and 8:15 to review options for management.

## 2013-11-15 ENCOUNTER — Ambulatory Visit (INDEPENDENT_AMBULATORY_CARE_PROVIDER_SITE_OTHER): Payer: 59 | Admitting: General Surgery

## 2013-11-15 ENCOUNTER — Encounter: Payer: Self-pay | Admitting: General Surgery

## 2013-11-15 VITALS — BP 120/90 | HR 76 | Resp 12 | Ht 63.0 in | Wt 219.0 lb

## 2013-11-15 DIAGNOSIS — C50312 Malignant neoplasm of lower-inner quadrant of left female breast: Secondary | ICD-10-CM | POA: Insufficient documentation

## 2013-11-15 DIAGNOSIS — C50319 Malignant neoplasm of lower-inner quadrant of unspecified female breast: Secondary | ICD-10-CM

## 2013-11-15 NOTE — Patient Instructions (Addendum)
Patient is scheduled for surgery at [facility] on 12/16/13, she is to arrive by 7:30 am and report to Radiology. She will pre admit at the hospital on 12/09/13 at 8:15 am. Patient is aware of dates, times, and instructions.

## 2013-11-15 NOTE — Progress Notes (Signed)
Patient ID: Annette Porter, female   DOB: September 17, 1950, 63 y.o.   MRN: [id number]  Chief Complaint  Patient presents with  . Other    Breast Discussion    HPI Annette Porter is a 63 y.o. female who presents for a breast discussion.   Patient here today for follow up post breast biopsy.  Dressing removed, steristrip in place and aware it may come off in one week.  Significant bruising noted.  The patient is aware that a heating pad may be used for comfort as needed.  Aware of pathology. Follow up as scheduled.   HPI  Past Medical History  Diagnosis Date  . Myocardial infarction 07/1998  . GERD (gastroesophageal reflux disease)   . 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 Negative margins on resection.    Past Surgical History  Procedure Laterality Date  . Breast biopsy Left 03/22/13     stereo  . Breast surgery Left 04/29/13    wide excision  . Breast mammosite  05/20/13  . Breast biopsy Left 11/11/13    History reviewed. No pertinent family history.  Social History History  Substance Use Topics  . Smoking status: Never Smoker   . Smokeless tobacco: Never Used  . Alcohol Use: No    Allergies  Allergen Reactions  . Hydrocodone Itching    Current Outpatient Prescriptions  Medication Sig Dispense Refill  . aspirin 81 MG tablet Take 81 mg by mouth daily.      Marland Kitchen atorvastatin (LIPITOR) 40 MG tablet Take 1 tablet by mouth daily.      . Cholecalciferol (VITAMIN D3) 1000 UNITS CAPS Take 1 capsule by mouth daily.      . CRESTOR 40 MG tablet Take 1 tablet by mouth daily.      Marland Kitchen docusate sodium (COLACE) 100 MG capsule Take 100 mg by mouth daily.      Marland Kitchen ibuprofen (ADVIL,MOTRIN) 800 MG tablet Take 1 tablet by mouth as needed.      . isosorbide mononitrate (IMDUR) 60 MG 24 hr tablet Take 1 tablet by mouth daily.      . metoprolol succinate (TOPROL-XL) 50 MG 24 hr  tablet Take 1 tablet by mouth daily.      . niacin (NIASPAN) 1000 MG CR tablet Take 1 tablet by mouth daily.      . nitroGLYCERIN (NITROSTAT) 0.4 MG SL tablet Place 0.4 mg under the tongue every 5 (five) minutes as needed for chest pain.      . ranitidine (ZANTAC) 75 MG tablet Take 75 mg by mouth at bedtime.      . senna-docusate (STOOL SOFTENER & LAXATIVE) 8.6-50 MG per tablet Take 1 tablet by mouth daily.      Marland Kitchen SYNTHROID 125 MCG tablet Take 1 tablet by mouth daily.      . vitamin B-12 (CYANOCOBALAMIN) 250 MCG tablet Take 250 mcg by mouth daily.      Marland Kitchen ZETIA 10 MG tablet Take 1 tablet by mouth daily.       No current facility-administered medications for this visit.    Review of Systems Review of Systems  Constitutional: Negative.   Respiratory: Negative.   Cardiovascular: Negative.     Blood pressure 120/90, pulse 76, resp. rate 12, height 5\' 3"  (1.6 m), weight 219 lb (99.338 kg).  Physical Exam Physical Exam  Constitutional: She appears well-developed and well-nourished.  HENT:  Head: Normocephalic.  Neck: Normal range of motion.  Cardiovascular: Normal rate and regular rhythm.   Pulmonary/Chest: Effort normal.    Lymphadenopathy:    She has no cervical adenopathy.    Data Reviewed Diagnosis: LEFT BREAST MASS AT 8:00 ENCORE BIOPSY *STAT*: - INVASIVE MAMMARY CARCINOMA, NO SPECIAL TYPE. Comment: Invasive carcinoma measures 0.8 cm in this material. Although the final grade should be assigned on the excision specimen, the findings correspond to a Nottingham grade of 2 (tubule formation score 3, nuclear pleomorphism score 2, mitotic count score 1, total score 6/9). ER / PR / HER 2 will be assessed by immunohistochemistry and reported in an addendum. Intradepartmental consultation was obtained.   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    Plan    Pathology is entirely different from the high-grade DCIS resected from the upper outer  quadrant of the left breast in 2014. As she received partial breast radiation options for management include 1) mastectomy versus 2) wide excision and postoperative radiation therapy. He will be up to the radiation oncologist to determine whether a second MammoSite treatment versus whole breast radiation would be appropriate. This will obviously be dependent the surgical anatomy postoperatively.  Indication for several node biopsy was discussed. Considering the previous dissection radiation the upper outer quadrant left breast there may be poor movement of the radiotracer and blue dye. Considering the low-grade, 8 mm tumor identified (resected size corresponds with ultrasound size) I would not proceed to axillary dissection if a sentinel node cannot be identified.  The opportunity for second surgical opinion, medical oncology radiation oncology assessment prior surgical intervention was discussed and at this time declined.     Patient is scheduled for surgery at [facility] on 12/16/13, she is to arrive by 7:30 am and report to Radiology. She will pre admit at the hospital on 12/09/13 at 8:15 am. Patient is aware of dates, times, and instructions.  PCP: Seth Bake Loa Idler 11/15/2013, 9:26 PM

## 2013-11-16 ENCOUNTER — Other Ambulatory Visit: Payer: Self-pay | Admitting: General Surgery

## 2013-11-16 DIAGNOSIS — C50919 Malignant neoplasm of unspecified site of unspecified female breast: Secondary | ICD-10-CM

## 2013-11-18 ENCOUNTER — Ambulatory Visit: Payer: 59

## 2013-11-19 LAB — PATHOLOGY

## 2013-12-09 ENCOUNTER — Ambulatory Visit: Payer: Self-pay | Admitting: General Surgery

## 2013-12-09 LAB — IMMUNOHISTOCHEMICAL STAIN(S)

## 2013-12-09 LAB — HER-2 / NEU, FISH
AVG NUM HER-2 SIGNALS/NUCLEUS: 2.1
Avg Num CEP17 probes/nucleus:: 1.7
HER-2/CEP17 Ratio: 1.22
NUMBER OF OBSERVERS: 2
Number of Tumor Cells Counted:: 40

## 2013-12-16 ENCOUNTER — Encounter: Payer: Self-pay | Admitting: General Surgery

## 2013-12-16 ENCOUNTER — Ambulatory Visit: Payer: Self-pay | Admitting: General Surgery

## 2013-12-16 DIAGNOSIS — C50319 Malignant neoplasm of lower-inner quadrant of unspecified female breast: Secondary | ICD-10-CM

## 2013-12-16 HISTORY — PX: BREAST LUMPECTOMY: SHX2

## 2013-12-16 HISTORY — PX: BREAST SURGERY: SHX581

## 2013-12-17 ENCOUNTER — Encounter: Payer: Self-pay | Admitting: General Surgery

## 2013-12-17 LAB — PATHOLOGY REPORT

## 2013-12-20 ENCOUNTER — Telehealth: Payer: Self-pay | Admitting: General Surgery

## 2013-12-20 NOTE — Telephone Encounter (Signed)
Notified path results good.  Will review w/ patient at time of f/u on July 15th.

## 2013-12-22 ENCOUNTER — Ambulatory Visit: Payer: 59 | Admitting: General Surgery

## 2013-12-22 ENCOUNTER — Other Ambulatory Visit (INDEPENDENT_AMBULATORY_CARE_PROVIDER_SITE_OTHER): Payer: 59

## 2013-12-22 ENCOUNTER — Encounter: Payer: Self-pay | Admitting: General Surgery

## 2013-12-22 VITALS — BP 140/76 | Ht 63.0 in | Wt 229.0 lb

## 2013-12-22 DIAGNOSIS — C50412 Malignant neoplasm of upper-outer quadrant of left female breast: Secondary | ICD-10-CM

## 2013-12-22 DIAGNOSIS — C50312 Malignant neoplasm of lower-inner quadrant of left female breast: Secondary | ICD-10-CM

## 2013-12-22 DIAGNOSIS — C50319 Malignant neoplasm of lower-inner quadrant of unspecified female breast: Secondary | ICD-10-CM

## 2013-12-22 NOTE — Patient Instructions (Signed)
Continue self breast exams. Call office for any new breast issues or concerns. 

## 2013-12-22 NOTE — Progress Notes (Signed)
Patient ID: Annette Porter, female   DOB: 10/10/1950, 63 y.o.   MRN: [id number]  Chief Complaint  Patient presents with  . Routine Post Op    left breast wide excision    HPI Annette Porter is a 63 y.o. female here today for her post op left breast wide excision and SN biopsy done on 12/16/13. Patient states she is doing well with minimal pain.   HPI  Past Medical History  Diagnosis Date  . Myocardial infarction 07/1998  . GERD (gastroesophageal reflux disease)   . 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 Negative margins on resection.  . 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      Past Surgical History  Procedure Laterality Date  . Breast mammosite  05/20/13  . Breast biopsy Left 03/22/13     stereo  . Breast surgery Left 04/29/13    wide excision  . Breast biopsy Left 11/11/13    INVASIVE MAMMARY CARCINOMA  . Breast surgery Left 12-16-13    Wide excision with SN biopsy, INVASIVE MAMMARY CARCINOMA    No family history on file.  Social History History  Substance Use Topics  . Smoking status: Never Smoker   . Smokeless tobacco: Never Used  . Alcohol Use: No    Allergies  Allergen Reactions  . Hydrocodone Itching    Current Outpatient Prescriptions  Medication Sig Dispense Refill  . aspirin 81 MG tablet Take 81 mg by mouth daily.      . atorvastatin (LIPITOR) 40 MG tablet Take 1 tablet by mouth daily.      . Cholecalciferol (VITAMIN D3) 1000 UNITS CAPS Take 1 capsule by mouth daily.      . CRESTOR 40 MG tablet Take 1 tablet by mouth daily.      . docusate sodium (COLACE) 100 MG capsule Take 100 mg by mouth daily.      . HYDROcodone-acetaminophen (NORCO/VICODIN) 5-325 MG per tablet       . ibuprofen (ADVIL,MOTRIN) 800 MG tablet Take 1  tablet by mouth as needed.      . isosorbide mononitrate (IMDUR) 60 MG 24 hr tablet Take 1 tablet by mouth daily.      . metoprolol succinate (TOPROL-XL) 50 MG 24 hr tablet Take 1 tablet by mouth daily.      . niacin (NIASPAN) 1000 MG CR tablet Take 1 tablet by mouth daily.      . nitroGLYCERIN (NITROSTAT) 0.4 MG SL tablet Place 0.4 mg under the tongue every 5 (five) minutes as needed for chest pain.      . ranitidine (ZANTAC) 75 MG tablet Take 75 mg by mouth at bedtime.      . senna-docusate (STOOL SOFTENER & LAXATIVE) 8.6-50 MG per tablet Take 1 tablet by mouth daily.      . SYNTHROID 125 MCG tablet Take 1 tablet by mouth daily.      . vitamin B-12 (CYANOCOBALAMIN) 250 MCG tablet Take 250 mcg by mouth daily.      . ZETIA 10 MG tablet Take 1 tablet by mouth daily.       No current facility-administered medications for this visit.    Review of Systems Review of Systems  Constitutional: Negative.   Respiratory: Negative.   Cardiovascular: Negative.       Blood pressure 140/76, height 5' 3" (1.6 m), weight 229 lb (103.874 kg).  Physical Exam Physical Exam  Constitutional: She is oriented to person, place, and time. She appears well-developed and well-nourished.  Neck: Neck supple.  Cardiovascular: Normal rate, regular rhythm and normal heart sounds.   Pulmonary/Chest: Effort normal and breath sounds normal.    Incisions healing well left breast  Lymphadenopathy:    She has no cervical adenopathy.  Neurological: She is alert and oriented to person, place, and time.  Skin: Skin is warm and dry.    Data Reviewed Pathology showed a T1 B. N0 carcinoma, ER/PR positive.  Ultrasound examination of the left breast was completed to allow consideration of partial breast radiation. The medial aspect of the wide excision site in the lower inner quadrant of the left breast shows a minimal skin distance of 2.3 cm. There is a small seroma cavity measuring up to 1.3 cm at this location. At the  area near the edge of the areola where the original excision was completed the minimal skin buffered 1.8 cm and a seroma cavity measuring up to 3.4 cm is noted. In total length the combined seroma cavity between the medial and lateral aspect of the wide excision measures approximate 7 cm in diameter.  Assessment    Doing well status post wide excision of the left breast.    Plan    Will review options for radiation management with Dr Baruch Gouty and arrange for assessment with his office in the near future.     PCP: Kirkland Hun 12/23/2013, 9:32 AM

## 2013-12-23 ENCOUNTER — Telehealth: Payer: Self-pay | Admitting: *Deleted

## 2013-12-23 ENCOUNTER — Encounter: Payer: Self-pay | Admitting: General Surgery

## 2013-12-23 DIAGNOSIS — C50412 Malignant neoplasm of upper-outer quadrant of left female breast: Secondary | ICD-10-CM | POA: Insufficient documentation

## 2013-12-23 MED ORDER — CEFADROXIL 500 MG PO CAPS: 500.0000 mg | ORAL_CAPSULE | Freq: Two times a day (BID) | ORAL | Status: DC

## 2013-12-23 NOTE — Telephone Encounter (Signed)
Mammosite schedule reviewed with the patient Placement  01-12-14 9:15   at [facility] Scan 01-14-14 Treat 8-10 thru [facility] will be calling her for more details Aware of ATB and directions reviewed. Aware no showers and to wear her bra while mammosite in place. Pt agrees.

## 2014-01-11 ENCOUNTER — Ambulatory Visit: Payer: Self-pay | Admitting: Radiation Oncology

## 2014-01-12 ENCOUNTER — Ambulatory Visit: Payer: 59

## 2014-01-12 ENCOUNTER — Encounter: Payer: Self-pay | Admitting: General Surgery

## 2014-01-12 ENCOUNTER — Ambulatory Visit (INDEPENDENT_AMBULATORY_CARE_PROVIDER_SITE_OTHER): Payer: 59 | Admitting: General Surgery

## 2014-01-12 VITALS — BP 172/84 | HR 68 | Resp 12 | Ht 63.0 in | Wt 247.0 lb

## 2014-01-12 DIAGNOSIS — C50919 Malignant neoplasm of unspecified site of unspecified female breast: Secondary | ICD-10-CM

## 2014-01-12 DIAGNOSIS — D0592 Unspecified type of carcinoma in situ of left breast: Secondary | ICD-10-CM

## 2014-01-12 DIAGNOSIS — D0590 Unspecified type of carcinoma in situ of unspecified breast: Secondary | ICD-10-CM

## 2014-01-12 DIAGNOSIS — C50912 Malignant neoplasm of unspecified site of left female breast: Secondary | ICD-10-CM

## 2014-01-12 NOTE — Patient Instructions (Signed)
Patient care kit given to patient.  Instructed no showers, sponge bath while mammosite in place, take antibiotic. [facility] with [facility] as arranged. Discussed wearing your bra for support at all times. 

## 2014-01-12 NOTE — Progress Notes (Signed)
Patient ID: Annette Porter, female   DOB: 12-25-1950, 63 y.o.   MRN: [id number]  Chief Complaint  Patient presents with  . Procedure    left mammosite placement    HPI Annette Porter is a 63 y.o. female.  Here today for left breast mammosite placement. No new complaints.   HPI  Past Medical History  Diagnosis Date  . Myocardial infarction 07/1998  . GERD (gastroesophageal reflux disease)   . 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 Negative margins on resection.  . 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      Past Surgical History  Procedure Laterality Date  . Breast mammosite  05/20/13  . Breast biopsy Left 03/22/13     stereo  . Breast surgery Left 04/29/13    wide excision  . Breast biopsy Left 11/11/13    INVASIVE MAMMARY CARCINOMA  . Breast surgery Left 12-16-13    Wide excision with SN biopsy, INVASIVE MAMMARY CARCINOMA    No family history on file.  Social History History  Substance Use Topics  . Smoking status: Never Smoker   . Smokeless tobacco: Never Used  . Alcohol Use: No    Allergies  Allergen Reactions  . Hydrocodone Itching    Current Outpatient Prescriptions  Medication Sig Dispense Refill  . aspirin 81 MG tablet Take 81 mg by mouth daily.      Marland Kitchen atorvastatin (LIPITOR) 40 MG tablet Take 1 tablet by mouth daily.      . cefadroxil (DURICEF) 500 MG capsule Take 1 capsule (500 mg total) by mouth 2 (two) times daily. Start one hour before office procedure on 01-12-14  20 capsule  0  . Cholecalciferol (VITAMIN D3) 1000 UNITS CAPS Take 1 capsule by mouth daily.      . CRESTOR 40 MG tablet Take 1 tablet by mouth daily.      Marland Kitchen docusate sodium (COLACE) 100 MG capsule Take 100 mg by mouth daily.      Marland Kitchen ibuprofen (ADVIL,MOTRIN) 800  MG tablet Take 1 tablet by mouth as needed.      . isosorbide mononitrate (IMDUR) 60 MG 24 hr tablet Take 1 tablet by mouth daily.      . metoprolol succinate (TOPROL-XL) 50 MG 24 hr tablet Take 1 tablet by mouth daily.      . niacin (NIASPAN) 1000 MG CR tablet Take 1 tablet by mouth daily.      . nitroGLYCERIN (NITROSTAT) 0.4 MG SL tablet Place 0.4 mg under the tongue every 5 (five) minutes as needed for chest pain.      . ranitidine (ZANTAC) 75 MG tablet Take 75 mg by mouth at bedtime.      . senna-docusate (STOOL SOFTENER & LAXATIVE) 8.6-50 MG per tablet Take 1 tablet by mouth daily.      Marland Kitchen SYNTHROID 125 MCG tablet Take 1 tablet by mouth daily.      . vitamin B-12 (CYANOCOBALAMIN) 250 MCG tablet Take 250 mcg by mouth daily.      Marland Kitchen ZETIA 10 MG tablet Take 1 tablet by mouth daily.       No current facility-administered medications for this visit.    Review of Systems Review of Systems  Constitutional: Negative.   Respiratory: Negative.   Cardiovascular: Negative.  Blood pressure 172/84, pulse 68, resp. rate 12, height _0  (1.6 m), weight 247 lb (112.038 kg).  Physical Exam Physical Exam  Pulmonary/Chest:      Data Reviewed Ultrasound examination confirmed a suitable cavity in the lower inner quadrant of the left breast. Prior to seroma drainage the spacing was 1.8 cm to the skin.  The area was prepped with ChloraPrep and 10 cc of 0.5% Xylocaine with 0.25% Marcaine with 1-200,000 of epinephrine was utilized well tolerated. ChloraPrep was again applied to the skin and the area draped. A 1 cm incision was made at the 6:00 position and an 8 mm trocar placed into the seroma cavity. Odorless fluid, 60-90 cc in volume was drained. Culture was obtained. The cavity evaluation device was then advanced and inflated to 50 cc. This showed a minimal spacing of 0.95 cm. There was a small amount of residual fluid laterally, well away from the primary tumor site which was at the periphery of  the breast. The cavity evaluation device was removed and the treatment room placed. This was inflated to 40 cc with a minimal skin spacing of 0.9 cm. Due to the size of the cavity (extra volume removed laterally at the time of wide excision) the catheter could slide laterally and for that reason was anchored into position with multiple Steri-Strips after the application of benzoin. The catheter was inserted to the 11-12 cm mark. Gauze and tape was applied. The procedure was well tolerated.  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    Plan    The patient will continue the [place] started prior to the procedure. She is scheduled for CT simulation on Friday, 01/14/2014. The catheter will be removed by the radiation oncologist at the end of treatment.  The plan for followup examination here in 2 weeks.        Robert Bellow 01/12/2014, 10:09 AM

## 2014-01-17 LAB — ANAEROBIC AND AEROBIC CULTURE

## 2014-01-25 ENCOUNTER — Encounter: Payer: Self-pay | Admitting: General Surgery

## 2014-01-25 ENCOUNTER — Ambulatory Visit (INDEPENDENT_AMBULATORY_CARE_PROVIDER_SITE_OTHER): Payer: Self-pay | Admitting: General Surgery

## 2014-01-25 VITALS — BP 168/64 | HR 74 | Resp 12 | Ht 63.0 in | Wt 247.0 lb

## 2014-01-25 DIAGNOSIS — C50919 Malignant neoplasm of unspecified site of unspecified female breast: Secondary | ICD-10-CM

## 2014-01-25 DIAGNOSIS — D0590 Unspecified type of carcinoma in situ of unspecified breast: Secondary | ICD-10-CM

## 2014-01-25 DIAGNOSIS — Z170 Estrogen receptor positive status [ER+]: Secondary | ICD-10-CM

## 2014-01-25 DIAGNOSIS — D0592 Unspecified type of carcinoma in situ of left breast: Secondary | ICD-10-CM

## 2014-01-25 DIAGNOSIS — C50911 Malignant neoplasm of unspecified site of right female breast: Secondary | ICD-10-CM

## 2014-01-25 NOTE — Progress Notes (Signed)
Patient ID: Annette Porter, female   DOB: 12/19/1950, 63 y.o.   MRN: [id number]  Chief Complaint  Patient presents with  . Follow-up    mammoosite     HPI Annette Porter is a 63 y.o. female here today follwing up from her mammositeMammosite schedule reviewed with the patient placement 01-12-14 9:15 at [facility]. Scan 01-14-14,Treat 8-10 and mammosite removed 01/21/14. Patient states she is doing well. a  HPI  Past Medical History  Diagnosis Date  . Myocardial infarction 07/1998  . GERD (gastroesophageal reflux disease)   . 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 Negative margins on resection.  . 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      Past Surgical History  Procedure Laterality Date  . Breast mammosite  05/20/13  . Breast biopsy Left 03/22/13     stereo  . Breast surgery Left 04/29/13    wide excision  . Breast biopsy Left 11/11/13    INVASIVE MAMMARY CARCINOMA  . Breast surgery Left 12-16-13    Wide excision with SN biopsy, INVASIVE MAMMARY CARCINOMA    No family history on file.  Social History History  Substance Use Topics  . Smoking status: Never Smoker   . Smokeless tobacco: Never Used  . Alcohol Use: No    Allergies  Allergen Reactions  . Hydrocodone Itching    Current Outpatient Prescriptions  Medication Sig Dispense Refill  . aspirin 81 MG tablet Take 81 mg by mouth daily.      Marland Kitchen atorvastatin (LIPITOR) 40 MG tablet Take 1 tablet by mouth daily.      . Cholecalciferol (VITAMIN D3) 1000 UNITS CAPS Take 1 capsule by mouth daily.      . CRESTOR 40 MG tablet Take 1 tablet by mouth daily.      Marland Kitchen docusate sodium (COLACE) 100 MG capsule Take 100 mg by mouth daily.      Marland Kitchen ibuprofen (ADVIL,MOTRIN) 800 MG tablet Take 1 tablet by mouth as  needed.      . isosorbide mononitrate (IMDUR) 60 MG 24 hr tablet Take 1 tablet by mouth daily.      . metoprolol succinate (TOPROL-XL) 50 MG 24 hr tablet Take 1 tablet by mouth daily.      . niacin (NIASPAN) 1000 MG CR tablet Take 1 tablet by mouth daily.      . nitroGLYCERIN (NITROSTAT) 0.4 MG SL tablet Place 0.4 mg under the tongue every 5 (five) minutes as needed for chest pain.      . ranitidine (ZANTAC) 75 MG tablet Take 75 mg by mouth at bedtime.      . senna-docusate (STOOL SOFTENER & LAXATIVE) 8.6-50 MG per tablet Take 1 tablet by mouth daily.      Marland Kitchen SYNTHROID 125 MCG tablet Take 1 tablet by mouth daily.      . vitamin B-12 (CYANOCOBALAMIN) 250 MCG tablet Take 250 mcg by mouth daily.      Marland Kitchen ZETIA 10 MG tablet Take 1 tablet by mouth daily.       No current facility-administered medications for this visit.    Review of Systems Review of Systems  Constitutional: Negative.   Respiratory: Negative.   Cardiovascular: Negative.     Blood pressure 168/64, pulse 74, resp. rate 12, height '5\' 3"'  (1.6  m), weight 247 lb (112.038 kg).  Physical Exam Physical Exam  Constitutional: She is oriented to person, place, and time. She appears well-developed and well-nourished.  Pulmonary/Chest:  Mammosite incision is clean and well healed.   Neurological: She is alert and oriented to person, place, and time.  Skin: Skin is warm and dry.    Data Reviewed T1c, N0, M0 carcinoma left breast, lower border. GR/PR positive. HER-2/neu data were expressed.  Assessment    Doing well status post wide excision, sentinel node biopsy and partial breast radiation.      Plan    The patient's regional DCIS was hormone receptor negative. She now has a stage I carcinoma in the lower inner quadrant. She likely requires nothing outside of antiestrogen therapy, but Oncotype DX testing can help her sort out if additional treatment would be of benefit. Will arrange for medical oncology opinion after additional  testing is completed.     PCP: Kirkland Hun 01/26/2014, 6:12 AM

## 2014-01-25 NOTE — Patient Instructions (Addendum)
Patient to talk with the oncologist after path comes back.Patient to return in one month. Right breast diagnotic mammogram in October 2015.

## 2014-01-26 ENCOUNTER — Encounter: Payer: Self-pay | Admitting: General Surgery

## 2014-02-08 ENCOUNTER — Ambulatory Visit: Payer: Self-pay | Admitting: Radiation Oncology

## 2014-02-08 ENCOUNTER — Telehealth: Payer: Self-pay | Admitting: *Deleted

## 2014-02-08 NOTE — Telephone Encounter (Signed)
I talked with the patient, pt pleased. She will be going out of town, Appointment with Dr. Oliva Bustard Wednesday 03-09-14 at 10:00. Pt agrees. Her appt with Dr Baruch Gouty is for the 23 ([place] out of town). Notes faxed.

## 2014-02-08 NOTE — Telephone Encounter (Signed)
Message copied by Carson Myrtle on Tue Feb 08, 2014  8:37 AM ------      Message from: Robert Bellow      Created: Tue Feb 08, 2014  8:28 AM       Notify the patient that the special testing suggests hormone treatment alone is adequate.  Would like her to consider getting formal opinion from medical oncology ([facility] or [facility]).   ------

## 2014-02-10 ENCOUNTER — Encounter: Payer: Self-pay | Admitting: General Surgery

## 2014-03-02 ENCOUNTER — Encounter: Payer: Self-pay | Admitting: General Surgery

## 2014-03-02 ENCOUNTER — Ambulatory Visit (INDEPENDENT_AMBULATORY_CARE_PROVIDER_SITE_OTHER): Payer: Self-pay | Admitting: General Surgery

## 2014-03-02 VITALS — BP 160/100 | HR 86 | Resp 12 | Ht 63.0 in | Wt 228.0 lb

## 2014-03-02 DIAGNOSIS — D0590 Unspecified type of carcinoma in situ of unspecified breast: Secondary | ICD-10-CM

## 2014-03-02 DIAGNOSIS — D0592 Unspecified type of carcinoma in situ of left breast: Secondary | ICD-10-CM

## 2014-03-02 MED ORDER — LETROZOLE 2.5 MG PO TABS: 2.5000 mg | ORAL_TABLET | Freq: Every day | ORAL | Status: DC

## 2014-03-02 NOTE — Patient Instructions (Signed)
Patient to return in one month after mammogram.

## 2014-03-02 NOTE — Progress Notes (Signed)
Patient ID: Annette Porter, female   DOB: Jun 06, 1951, 63 y.o.   MRN: [id number]  Chief Complaint  Patient presents with  . Follow-up    mammosite    HPI Annette Porter is a 63 y.o. female here today following up for her mammosite  placement 01-12-14 9:15 at [facility]. Scan 01-14-14,Treat 8-10 and mammosite removed 01/21/14. Patient is scheduled for a mammogram on 03/10/14 at [facility] of the right breast.   HPI  Past Medical History  Diagnosis Date  . Myocardial infarction 07/1998  . GERD (gastroesophageal reflux disease)   . 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 Negative margins on resection. MammoSite  . 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 Oncotype DX recurrent score: 7% with antiestrogen alone. MammoSite.     Past Surgical History  Procedure Laterality Date  . Breast mammosite  05/20/13  . Breast biopsy Left 03/22/13     stereo  . Breast surgery Left 04/29/13    wide excision  . Breast biopsy Left 11/11/13    INVASIVE MAMMARY CARCINOMA  . Breast surgery Left 12-16-13    Wide excision with SN biopsy, INVASIVE MAMMARY CARCINOMA    No family history on file.  Social History History  Substance Use Topics  . Smoking status: Never Smoker   . Smokeless tobacco: Never Used  . Alcohol Use: No    Allergies  Allergen Reactions  . Hydrocodone Itching    Current Outpatient Prescriptions  Medication Sig Dispense Refill  . aspirin 81 MG tablet Take 81 mg by mouth daily.      Marland Kitchen atorvastatin (LIPITOR) 40 MG tablet Take 1 tablet by mouth daily.      . Cholecalciferol (VITAMIN D3) 1000 UNITS CAPS Take 1 capsule by mouth daily.      . CRESTOR 40 MG tablet Take 1 tablet by mouth daily.      Marland Kitchen docusate sodium (COLACE) 100 MG capsule Take 100 mg by mouth daily.      Marland Kitchen  ibuprofen (ADVIL,MOTRIN) 800 MG tablet Take 1 tablet by mouth as needed.      . isosorbide mononitrate (IMDUR) 60 MG 24 hr tablet Take 1 tablet by mouth daily.      . metoprolol succinate (TOPROL-XL) 50 MG 24 hr tablet Take 1 tablet by mouth daily.      . niacin (NIASPAN) 1000 MG CR tablet Take 1 tablet by mouth daily.      . nitroGLYCERIN (NITROSTAT) 0.4 MG SL tablet Place 0.4 mg under the tongue every 5 (five) minutes as needed for chest pain.      . ranitidine (ZANTAC) 75 MG tablet Take 75 mg by mouth at bedtime.      . senna-docusate (STOOL SOFTENER & LAXATIVE) 8.6-50 MG per tablet Take 1 tablet by mouth daily.      Marland Kitchen SYNTHROID 125 MCG tablet Take 1 tablet by mouth daily.      . vitamin B-12 (CYANOCOBALAMIN) 250 MCG tablet Take 250 mcg by mouth daily.      Marland Kitchen ZETIA 10 MG tablet Take 1 tablet by mouth daily.      Marland Kitchen letrozole (FEMARA) 2.5 MG tablet Take 1 tablet (2.5 mg total) by mouth daily.  30 tablet  12   No current facility-administered medications for this visit.  Review of Systems Review of Systems  Constitutional: Negative.   Respiratory: Negative.   Cardiovascular: Negative.     Blood pressure 160/100, pulse 86, resp. rate 12, height '5\' 3"'  (1.6 m), weight 228 lb (103.42 kg).  Physical Exam Physical Exam  Constitutional: She is oriented to person, place, and time. She appears well-developed and well-nourished.  Pulmonary/Chest: Left breast exhibits no inverted nipple, no mass, no nipple discharge, no skin change and no tenderness.    Left breast mild skin thickening   Lymphadenopathy:    She has no axillary adenopathy.  Neurological: She is alert and oriented to person, place, and time.  Skin: Skin is warm and dry.     Assessment    Doing well status post second MammoSite treatment.    Plan    Arrangements have been made for a formal medical oncology assessment. She has a very low recurrent score on Oncotype DX testing and had a highly sensitive question  positive tumor. I suspect she will do well with letrozole, 2.5 mg per day. A prescription for the same as been sent per pharmacy, but she's been instructed not to initiate this until after her upcoming appointment with Dr. Oliva Bustard.     PCP: Kirkland Hun 03/03/2014, 4:05 PM

## 2014-03-03 ENCOUNTER — Encounter: Payer: Self-pay | Admitting: General Surgery

## 2014-03-03 DIAGNOSIS — D0590 Unspecified type of carcinoma in situ of unspecified breast: Secondary | ICD-10-CM | POA: Insufficient documentation

## 2014-03-10 ENCOUNTER — Encounter: Payer: Self-pay | Admitting: General Surgery

## 2014-03-23 DIAGNOSIS — I2510 Atherosclerotic heart disease of native coronary artery without angina pectoris: Secondary | ICD-10-CM | POA: Insufficient documentation

## 2014-03-23 DIAGNOSIS — E785 Hyperlipidemia, unspecified: Secondary | ICD-10-CM | POA: Insufficient documentation

## 2014-04-04 ENCOUNTER — Ambulatory Visit (INDEPENDENT_AMBULATORY_CARE_PROVIDER_SITE_OTHER): Payer: Self-pay | Admitting: General Surgery

## 2014-04-04 ENCOUNTER — Encounter: Payer: Self-pay | Admitting: General Surgery

## 2014-04-04 VITALS — BP 150/82 | HR 80 | Resp 14 | Ht 63.0 in | Wt 227.0 lb

## 2014-04-04 DIAGNOSIS — D0592 Unspecified type of carcinoma in situ of left breast: Secondary | ICD-10-CM

## 2014-04-04 DIAGNOSIS — C50919 Malignant neoplasm of unspecified site of unspecified female breast: Secondary | ICD-10-CM | POA: Insufficient documentation

## 2014-04-04 DIAGNOSIS — C50911 Malignant neoplasm of unspecified site of right female breast: Secondary | ICD-10-CM

## 2014-04-04 DIAGNOSIS — Z170 Estrogen receptor positive status [ER+]: Secondary | ICD-10-CM

## 2014-04-04 NOTE — Progress Notes (Signed)
Patient ID: Annette Porter, female   DOB: Sep 22, 1950, 63 y.o.   MRN: [id number]  Chief Complaint  Patient presents with  . Follow-up    mammogram    HPI Annette Porter is a 63 y.o. female who presents for a breast evaluation. The most recent mammogram was done on 03/10/14 . Patient does perform regular self breast checks and gets regular mammograms done.  The patient denies any new problems with her breasts at this time.   Since her last visit she did meet with Blain Pais, M.D. for medical oncology. Written report from that meeting his yet to be received, but the patient reports that he agrees that antiestrogen therapy alone as needed. He did counsel her to be sure that bone density studies are obtained.  HPI  Past Medical History  Diagnosis Date  . Myocardial infarction 07/1998  . GERD (gastroesophageal reflux disease)   . 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 Negative margins on resection. MammoSite  . 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 Oncotype DX recurrent score: 7% with antiestrogen alone. MammoSite.     Past Surgical History  Procedure Laterality Date  . Breast mammosite  05/20/13  . Breast biopsy Left 03/22/13     stereo  . Breast surgery Left 04/29/13    wide excision  . Breast biopsy Left 11/11/13    INVASIVE MAMMARY CARCINOMA  . Breast surgery Left 12-16-13    Wide excision with SN biopsy, INVASIVE MAMMARY CARCINOMA  . Colonoscopy  2006    History reviewed. No pertinent family history.  Social History History  Substance Use Topics  . Smoking status: Never Smoker   . Smokeless tobacco: Never Used  . Alcohol Use: No    Allergies  Allergen Reactions  . Hydrocodone Itching    Current Outpatient Prescriptions  Medication Sig  Dispense Refill  . aspirin 81 MG tablet Take 81 mg by mouth daily.      Marland Kitchen atorvastatin (LIPITOR) 40 MG tablet Take 1 tablet by mouth daily.      . Calcium Carbonate (CALCIUM 600 PO) Take 1 tablet by mouth 2 (two) times daily.      . Cholecalciferol (VITAMIN D3) 1000 UNITS CAPS Take 1 capsule by mouth daily.      . CRESTOR 40 MG tablet Take 1 tablet by mouth daily.      Marland Kitchen docusate sodium (COLACE) 100 MG capsule Take 100 mg by mouth daily.      Marland Kitchen ibuprofen (ADVIL,MOTRIN) 800 MG tablet Take 1 tablet by mouth as needed.      . isosorbide mononitrate (IMDUR) 60 MG 24 hr tablet Take 1 tablet by mouth daily.      Marland Kitchen letrozole (FEMARA) 2.5 MG tablet Take 1 tablet (2.5 mg total) by mouth daily.  30 tablet  12  . metoprolol succinate (TOPROL-XL) 50 MG 24 hr tablet Take 1 tablet by mouth daily.      . niacin (NIASPAN) 1000 MG CR tablet Take 1 tablet by mouth daily.      . nitroGLYCERIN (NITROSTAT) 0.4 MG SL tablet Place 0.4 mg under the tongue every 5 (five) minutes as needed for chest pain.      . ranitidine (ZANTAC) 75 MG tablet Take 75 mg by mouth at bedtime.      Marland Kitchen  senna-docusate (STOOL SOFTENER & LAXATIVE) 8.6-50 MG per tablet Take 1 tablet by mouth daily.      Marland Kitchen SYNTHROID 125 MCG tablet Take 1 tablet by mouth daily.      . vitamin B-12 (CYANOCOBALAMIN) 250 MCG tablet Take 250 mcg by mouth daily.      Marland Kitchen ZETIA 10 MG tablet Take 1 tablet by mouth daily.       No current facility-administered medications for this visit.    Review of Systems Review of Systems  Constitutional: Negative.   Respiratory: Negative.   Cardiovascular: Negative.     Blood pressure 150/82, pulse 80, resp. rate 14, height '5\' 3"'  (1.6 m), weight 227 lb (102.967 kg).  Physical Exam Physical Exam  Constitutional: She is oriented to person, place, and time. She appears well-developed and well-nourished.  Neck: Neck supple. No thyromegaly present.  Cardiovascular: Normal rate, regular rhythm and normal heart sounds.   No  murmur heard. Pulmonary/Chest: Effort normal and breath sounds normal. Right breast exhibits no inverted nipple, no mass, no nipple discharge, no skin change and no tenderness. Left breast exhibits no inverted nipple, no mass, no nipple discharge, no skin change and no tenderness.    Lower inner quadrant of left breast incision well healed. As well a 3 o'clock left breast incision that is well healed.    Lymphadenopathy:    She has no cervical adenopathy.    She has no axillary adenopathy.  Neurological: She is alert and oriented to person, place, and time.  Skin: Skin is warm and dry.    Data Reviewed No new studies.  Assessment    Doing well status post wide excision and partial breast radiation for a stage I carcinoma of the lower inner quadrant of the left breast.    Plan    We'll plan for a left breast diagnostic mammogram in April 2016 with an office visit to follow.  The patient will continue on Femara 2.5 mg daily.  She will discuss with Dr. Jeananne Rama at her upcoming appointment regarding her last or first bone density test.  Last colonoscopy was in 2006 with Dr. Nicolasa Ducking. A repeat study would be appropriate in 2016. She'll discuss with Dr. Janae Bridgeman if he would like that to be completed through this office or with another physician.    PCP/Ref MD: Kirkland Hun 04/04/2014, 1:40 PM

## 2014-04-04 NOTE — Patient Instructions (Addendum)
Patient to return in April 2016 with left breast mammogram. Continue self breast exams. Call office for any new breast issues or concerns.

## 2014-04-19 ENCOUNTER — Ambulatory Visit: Payer: Self-pay | Admitting: Radiation Oncology

## 2014-06-08 ENCOUNTER — Ambulatory Visit: Payer: Self-pay | Admitting: Family Medicine

## 2014-08-02 ENCOUNTER — Ambulatory Visit: Payer: Self-pay | Admitting: Radiation Oncology

## 2014-09-19 ENCOUNTER — Encounter: Payer: Self-pay | Admitting: General Surgery

## 2014-09-21 ENCOUNTER — Encounter: Payer: Self-pay | Admitting: General Surgery

## 2014-09-21 ENCOUNTER — Ambulatory Visit (INDEPENDENT_AMBULATORY_CARE_PROVIDER_SITE_OTHER): Payer: 59 | Admitting: General Surgery

## 2014-09-21 VITALS — BP 156/84 | HR 62 | Resp 14 | Ht 63.0 in | Wt 232.0 lb

## 2014-09-21 DIAGNOSIS — C50912 Malignant neoplasm of unspecified site of left female breast: Secondary | ICD-10-CM | POA: Diagnosis not present

## 2014-09-21 DIAGNOSIS — D0592 Unspecified type of carcinoma in situ of left breast: Secondary | ICD-10-CM | POA: Diagnosis not present

## 2014-09-21 DIAGNOSIS — C50919 Malignant neoplasm of unspecified site of unspecified female breast: Secondary | ICD-10-CM | POA: Insufficient documentation

## 2014-09-21 DIAGNOSIS — R21 Rash and other nonspecific skin eruption: Secondary | ICD-10-CM

## 2014-09-21 MED ORDER — CLINDAMYCIN PHOSPHATE 1 % EX SOLN: CUTANEOUS | Status: DC

## 2014-09-21 NOTE — Progress Notes (Signed)
Patient ID: Annette Porter, female   DOB: 13-Mar-1951, 64 y.o.   MRN: [id number]  Chief Complaint  Patient presents with  . Follow-up    mammogram    HPI Annette Porter is a 64 y.o. female.  who presents for follow up left mammogram and breast evaluation. The most recent mammogram was done on 09-16-14.  Patient does perform regular self breast checks and gets regular mammograms done.  She has noticed a rash on her left breast for at least a couple of months with no itching. She is tolerating the letrozole. Her bone density was in December 2015 and was normal.  HPI  Past Medical History  Diagnosis Date  . Myocardial infarction 07/1998  . GERD (gastroesophageal reflux disease)   . 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 Negative margins on resection. MammoSite  . 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 Oncotype DX recurrent score: 7% with antiestrogen alone. MammoSite.     Past Surgical History  Procedure Laterality Date  . Breast mammosite  05/20/13  . Breast biopsy Left 03/22/13     stereo  . Breast surgery Left 04/29/13    wide excision  . Breast biopsy Left 11/11/13    INVASIVE MAMMARY CARCINOMA  . Breast surgery Left 12-16-13    Wide excision with SN biopsy, INVASIVE MAMMARY CARCINOMA  . Colonoscopy  2006    No family history on file.  Social History History  Substance Use Topics  . Smoking status: Never Smoker   . Smokeless tobacco: Never Used  . Alcohol Use: No    Allergies  Allergen Reactions  . Hydrocodone Itching  . Tape Other (See Comments)    irratation    Current Outpatient Prescriptions  Medication Sig Dispense Refill  . aspirin 81 MG tablet Take 81 mg by mouth daily.    Marland Kitchen atorvastatin (LIPITOR) 40 MG tablet Take 1  tablet by mouth daily.    . Calcium Carbonate (CALCIUM 600 PO) Take 1 tablet by mouth 2 (two) times daily.    . Cholecalciferol (VITAMIN D3) 1000 UNITS CAPS Take 1 capsule by mouth daily.    . CRESTOR 40 MG tablet Take 1 tablet by mouth daily.    Marland Kitchen docusate sodium (COLACE) 100 MG capsule Take 100 mg by mouth daily.    Marland Kitchen ibuprofen (ADVIL,MOTRIN) 800 MG tablet Take 1 tablet by mouth as needed.    . isosorbide mononitrate (IMDUR) 60 MG 24 hr tablet Take 1 tablet by mouth daily.    Marland Kitchen letrozole (FEMARA) 2.5 MG tablet Take 1 tablet (2.5 mg total) by mouth daily. 30 tablet 12  . metoprolol succinate (TOPROL-XL) 50 MG 24 hr tablet Take 1 tablet by mouth daily.    . niacin (NIASPAN) 1000 MG CR tablet Take 1 tablet by mouth daily.    . nitroGLYCERIN (NITROSTAT) 0.4 MG SL tablet Place 0.4 mg under the tongue every 5 (five) minutes as needed for chest pain.    . ranitidine (ZANTAC) 75 MG tablet Take 75 mg by mouth at bedtime.    . senna-docusate (STOOL SOFTENER & LAXATIVE) 8.6-50 MG per tablet Take 1 tablet by mouth daily.    Marland Kitchen SYNTHROID 125 MCG tablet Take 1 tablet by mouth daily.    . vitamin B-12 (CYANOCOBALAMIN) 250 MCG tablet Take  250 mcg by mouth daily.    Marland Kitchen ZETIA 10 MG tablet Take 1 tablet by mouth daily.    . clindamycin (CLEOCIN-T) 1 % external solution Apply to affected area 2 times daily 60 mL 0   No current facility-administered medications for this visit.    Review of Systems Review of Systems  Constitutional: Negative.   Respiratory: Negative.   Cardiovascular: Negative.     Blood pressure 156/84, pulse 62, resp. rate 14, height '5\' 3"'  (1.6 m), weight 232 lb (105.235 kg).  Physical Exam Physical Exam  Constitutional: She is oriented to person, place, and time. She appears well-developed and well-nourished.  Neck: Neck supple.  Cardiovascular: Normal rate, regular rhythm and normal heart sounds.   Pulmonary/Chest: Effort normal and breath sounds normal. Right breast exhibits no  inverted nipple, no mass, no nipple discharge, no skin change and no tenderness. Left breast exhibits no inverted nipple, no mass, no nipple discharge, no skin change and no tenderness.    Left breast < right breast. Well healed incision at 7 o'clock upper outer quadrant there is a 3 cm area of fullness. Pustula rash 6 cm area upper inner quadrant eft breast.  Lymphadenopathy:    She has no cervical adenopathy.    She has no axillary adenopathy.  Neurological: She is alert and oriented to person, place, and time.  Skin: Skin is warm and dry.    Data Reviewed Left breast diagnostic mammogram dated 09/16/2014 was reviewed. Architectural distortion consistent with previous surgery is noted. BI-RADS-2.  Assessment    Doing well status post breast resection and partial breast radiation 2. Cutaneous rash, slightly papular/pustule.    Plan    The patient will make use of Cleocin lotion twice a day for 2 weeks and give a progress report at that time. If there is not complete resolution dermatology consultation will be obtained.  She reported some discomfort in the left knee, I long-standing site of difficulty over the years. This is unlikely related to the recently initiated Femara therapy. She was encouraged to follow-up with her orthopedist, Christophe Louis, M.D.  She'll review indications for follow-up colonoscopy with her primary care physician at the time of her yearly exam in July.     Follow up in 6 months with bilateral mammogram and office visit. Call with up status report of rash after using the cream.   PCP:  Kennieth Francois 09/21/2014, 9:40 AM

## 2014-09-21 NOTE — Patient Instructions (Addendum)
Continue self breast exams. Call office for any new breast issues or concerns. Follow up in 6 months with bilateral mammogram and office visit.

## 2014-09-30 NOTE — Op Note (Signed)
PATIENT NAME:  Annette Porter, Annette Porter A MR#:  [id number] DATE OF BIRTH:  04-25-1951  DATE OF PROCEDURE:  04/29/2013  PREOPERATIVE DIAGNOSIS:  High-grade ductal carcinoma in situ of the left breast.  POSTOPERATIVE DIAGNOSIS:  High-grade ductal carcinoma in situ of the left breast.  OPERATIVE PROCEDURE:  Wide local excision with needle localization.  SURGEON:  Robert Bellow, MD  ANESTHESIA:  General by LMA, Marcaine 0.5% with 1:200,000 units of epinephrine.  ESTIMATED BLOOD LOSS:  Minimal.  CLINICAL NOTE:  This 64 year old woman underwent a stereotactic biopsy with finding of high-grade DCIS. No invasive component. She desired breast conservation. Preoperative ultrasound failed to show a biopsy cavity and for that reason, she underwent needle localization the morning of the procedure.  OPERATIVE NOTE:  With the patient under adequate general anesthesia, the breast was carefully prepped after distracting it medially to provide better exposure of the lateral needle entry site. Marcaine was infiltrated for postoperative analgesia. The skin was incised sharply, and the localizing needle tracked down to the area of the previous biopsy. A 4 x 4 x 5 cm block of tissue was extracted and oriented. Specimen radiograph confirmed the previously placed biopsy marker, intact wire, and the few remaining calcifications. The biopsy cavity showed good hemostasis. The breast tissue was approximated in layers with interrupted 2-0 Vicryl figure-of-eight sutures. The skin was closed with a running 4-0 Vicryl subcuticular suture. Benzoin, Steri-Strips, Telfa and Tegaderm dressing was then applied.   The patient tolerated the procedure well and was taken to the recovery room in stable condition.   ____________________________ Robert Bellow, MD jwb:jcm D: 04/30/2013 14:34:27 ET T: 04/30/2013 15:17:24 ET JOB#: [id number]  cc: Robert Bellow, MD, <Dictator> Guadalupe Maple, MD Keeana Pieratt Amedeo Kinsman MD ELECTRONICALLY  SIGNED 05/12/2013 8:15

## 2014-09-30 NOTE — Consult Note (Signed)
Reason for Visit: This 64 year old Female patient presents to the clinic for initial evaluation of  breast cancer .   Referred by Dr. Hervey Ard.  Diagnosis:  Chief Complaint/Diagnosis   64 year old female status post wide local excision for grade 3 ductal carcinoma in situ ER/PR negative margins clear but close at less than 0.5 mm for possible accelerated partial breast irradiation. Pathologic stage0 (Tis N0 M0)  Pathology Report pathology report reviewed   Imaging Report mammograms reviewed   Referral Report clinical notes reviewed   HPI   ppatient is a 64 year old female who presented with an abnormal mammogram of her left breast showing suspicious calcifications in the upper-outer quadrant of the left breast. She underwent needlelocalization showing ER/PR negative grade 3 ductal carcinoma in situ with comedonecrosis. She underwent a wide local excision for 2.5 cm lesion again grade 3. Margins were clear but close at less than 0.5 mm. Comedonecrosis was present. She's tolerated her surgery well. She specifically denies breast tenderness cough or bone pain. He is occasionally having some shooting pains in her incision site consistent with scar healing.her past medical history significant for prior MI.  Past, Family and Social History:  Past Medical History positive   Cardiovascular hyperlipidemia; myocardial infarction   Past Medical History Comments obesity   Family History noncontributory   Social History noncontributory   Additional Past Medical and Surgical History seen by herself today   Allergies:   Dilaudid: GI Distress, N/V/Diarrhea  Tape: Other  Home Meds:  Home Medications: Medication Instructions Status  acetaminophen-HYDROcodone 325 mg-5 mg oral tablet 1 tab(s) orally every 4 to 6 hours, As needed, pain Active  Vitamin D3 1000 intl units oral tablet 1 tab(s) orally once a day (in the morning) Active  docusate sodium sodium 100 mg oral capsule 2 cap(s)  orally once a day (in the morning) Active  Synthroid 125 mcg (0.125 mg) oral tablet 1 tab(s) orally once a day (in the morning) Active  Metoprolol Succinate ER 50 mg oral tablet, extended release 1 tab(s) orally once a day (in the morning) Active  nitroglycerin 0.4 mg sublingual tablet 1 tab(s) sublingual every 5 minutes X 3 then call EMT Active  isosorbide mononitrate 30 mg oral tablet, extended release 1 tab(s) orally once a day (in the morning). Takes with a 60 mg tab to total 90mg  Active  isosorbide mononitrate 60 mg oral tablet, extended release 1 tab(s) orally once a day (in the morning), takes with 30mg  tab to total 90mg  Active  ibuprofen 800 mg oral tablet 1 tab(s) orally 3 times a day, As Needed Active  atorvastatin 40 mg oral tablet 1 tab(s) orally once a day (at bedtime) Active  niacin extended release 1000 mg oral tablet, extended release 1 tab(s) orally once a day (at bedtime), As Needed Active  Zetia 10 mg oral tablet 1 tab(s) orally once a day (at bedtime) Active  ranitidine 150 mg oral tablet 1 tab(s) orally once a day (at bedtime) Active  aspirin 81 mg oral tablet 1 tab(s) orally once a day (in the morning) Active  Tylenol 325 mg oral tablet 2 tab(s) orally every 4 hours, As Needed - for Pain Active   Review of Systems:  General negative   Performance Status (ECOG) 0   Skin negative   Breast see HPI   Ophthalmologic negative   ENMT negative   Respiratory and Thorax negative   Cardiovascular see HPI   Gastrointestinal negative   Genitourinary negative   Musculoskeletal negative  Neurological negative   Psychiatric negative   Hematology/Lymphatics negative   Endocrine negative   Allergic/Immunologic negative   Review of Systems   review of systems obtainedfrom nurses notes  Physical Exam:  General/Skin/HEENT:  General normal   Skin normal   Eyes normal   ENMT normal   Head and Neck normal   Additional PE well-developed obese female in  NAD. She status post wide local excision of left breast which is healing well. No dominant mass or nodularity is noted in either breast into position examined. Lungs are clear to A&P cardiac examination shows regular rate and rhythm. No cervical or supraclavicular adenopathy is appreciated.her breasts are large.   Breasts/Resp/CV/GI/GU:  Respiratory and Thorax normal   Cardiovascular normal   Gastrointestinal normal   Genitourinary normal   MS/Neuro/Psych/Lymph:  Musculoskeletal normal   Neurological normal   Lymphatics normal   Other Results:  Radiology Results: LabUnknown:    07-Oct-14 11:18, Digital Additional Views Lt Breast [facility])  PACS Image   [facility]:  Digital Additional Views Lt Breast (SCR)   REASON FOR EXAM:    av lt mas and calcs  COMMENTS:       PROCEDURE: MAM - MAM DGTL ADD VW LT  SCR  - Mar 16 2013 11:18AM     *RADIOLOGY REPORT*    Clinical Data:  The patient returns for evaluation of a possible  mass in the medial aspect of the leftbreast and of calcifications  in the outer portion of the left breast.    DIGITAL DIAGNOSTIC LEFT LIMITED MAMMOGRAM  AND LEFT BREAST  ULTRASOUND:    Comparison:  02/21/2012, 02/19/2011, 02/05/2010, 11/14/2008  Findings:    ACR Breast Density Categorya:  The breast tissue is almost  entirely fatty.    Additional views demonstrate an oval group of pleomorphic  microcalcifications in the two to three o'clock position of the  left breast posteriorly.  The appearance is suspicious and biopsy  is suggested.    Additional views of the medial aspect of the left breast  demonstrate a low density oval nodule medially and posteriorly.    Mammographic images were processed with CAD.  On physical exam, no mass is palpated in the left breast.    Ultrasound is performed, showing a 6 mm cyst at 8 o'clock 10 cm  from the left nipple.     IMPRESSION:    1.  Suspicious calcifications in the two to three o'clock position  of  the left breast posteriorly.  2.  Left breast cyst.    RECOMMENDATION:  Stereotactic core needle biopsy of the calcifications in the outer  portion of the left breast is suggested.  Report was telephoned to  Dr. Rance Muir office.  This has been scheduled for 03/22/2012.  I have discussed the findings and recommendations with the patient.  Results were also provided in writing at the conclusion of the  visit.  If applicable, a reminder letter will be sent to the  patient regarding the next appointment.    BI-RADS CATEGORY 4:  Suspicious abnormality - biopsy should be  considered.      Original Report Authenticated By: Ulyess Blossom, M.D.         Verified By: Chaney Born, M.D.,   Relevent Results:   Relevant Scans and Labs mammograms ultrasound reviewed   Assessment and Plan: Impression:   stage 0 ductal carcinoma in situ ER/PR negative status post wide local excision in 64 year old female Plan:   at this time  I have made a requisitions as far as whole breast radiation  in the adjuvant setting for ductal carcinoma in situ. Believe based on her large breasts she also be a good candidate for accelerated partial breast irradiation to deliver 3400 cGy in 10 fractions at 340 cGy twice a day through the MammoSite balloon catheter. Patient is interested in the accelerated treatment regimen and I will her to Dr. Dr. Hervey Ard for catheter replacement and then followup with BrachyVision treatment planning and high-dose rate remote afterloading. Risks and benefits of that procedure including possible skin reaction, possible fibrosis of the lumpectomy site, fatigue or were explained in detail to the patient. Should her catheter not be able to placed for close proximity chest wall or skin or other technical reasons would go ahead with whole breast radiation. We'll coordinate catheter placement with Dr. Tollie Pizza office.patient also was ER/PR negative will not be a candidate for  tamoxifen therapy after completion of treatment. I would like to take this opportunity to thank you for allowing me to continue to participate in this patient's care.  CC Referral:  cc: Dr. Hervey Ard, Dr. Jeananne Rama   Electronic Signatures: Baruch Gouty, Roda Shutters (MD)  (Signed 04-Dec-14 14:52)  Authored: HPI, Diagnosis, PFSH, Allergies, Home Meds, ROS, Physical Exam, Other Results, Relevent Results, Encounter Assessment and Plan, CC Referring Physician   Last Updated: 04-Dec-14 14:52 by Armstead Peaks (MD)

## 2014-10-01 NOTE — Op Note (Signed)
PATIENT NAME:  Annette Porter, Annette Porter MR#:  [id number] DATE OF BIRTH:  [birth year]  DATE OF PROCEDURE:  12/16/2013  PREOPERATIVE DIAGNOSIS: Invasive mammary carcinoma of the left breast.   POSTOPERATIVE DIAGNOSIS: Invasive mammary carcinoma of the left breast.   OPERATIVE PROCEDURE: Wide local excision with a sentinel node biopsy.   OPERATING SURGEON: Hervey Ard, MD   ANESTHESIA: General by LMA under Dr. Marcello Moores, Marcaine 0.5% with 1:200,000 units epinephrine, 30 mL local infiltration.   ESTIMATED BLOOD LOSS: Minimal.  CLINICAL NOTE: This 64 year old woman recently had a mammogram showing a new nodular density in the inferior medial aspect of the left breast. Core biopsy showed evidence of 8 mm invasive mammary carcinoma. She was felt to be a candidate for breast conservation. The patient was injected with technetium sulfur colloid prior to the procedure.   OPERATIVE NOTE: With the patient under adequate general anesthesia, 4 mL of a dilution of 1:2 methylene blue to normal saline was injected in the subareolar plexus. The breast was then prepped with ChloraPrep and draped. The area of the eight o'clock position of the breast, where the area had been previously identified, was scanned with ultrasound without a clear identification of the previously placed clip. A radial incision was made and carried out through the skin and subcutaneous tissue and a block of tissue excised down to the fascia resected. Specimen radiograph did not confirm the previous clip. Re-examination with ultrasound showed the lesion of the periphery of the breast near the costochondral junction and this was excised, orientated, and specimen radiograph confirmed the previously placed clip. The breast tissue was mobilized off the underlying pectoralis fascia and the defect was closed with interrupted 2-0 Vicryl figure-of-eight sutures in multiple layers. The skin was closed with a running 4-0 Vicryl subcuticular suture.   While the  biopsy results are pending margin assessment, attention was turned to the axilla. The Gamma finder showed increased uptake in the lower aspect of the axilla and a transverse incision was made and carried down through the skin and generous layer of adipose tissue. A single hot blue node was identified and sent for frozen section. This was reported negative for macro metastatic disease. Scanning through the axilla showed no additional lesions. The wound was closed in layers with 2-0 Vicryl suture and then a 4-0 Vicryl running subcuticular suture for the skin. Benzoin and Steri-Strips followed by Telfa pad, fluff gauze, Kerlix and an ace wrap were applied to both wounds.   The patient tolerated the procedure well and was taken to the recovery room in stable condition.    ____________________________ Robert Bellow, MD jwb:dd D: 12/16/2013 21:07:24 ET T: 12/17/2013 02:40:53 ET JOB#: [id number]  cc: Robert Bellow, MD, <Dictator> Guadalupe Maple, MD Korde Jeppsen Amedeo Kinsman MD ELECTRONICALLY SIGNED 12/17/2013 13:06

## 2014-10-29 IMAGING — MG MAM BREAST SPECIMEN
1 series · 1 of 1 positions shown · non-contrast
Comparison: none

[L CC]
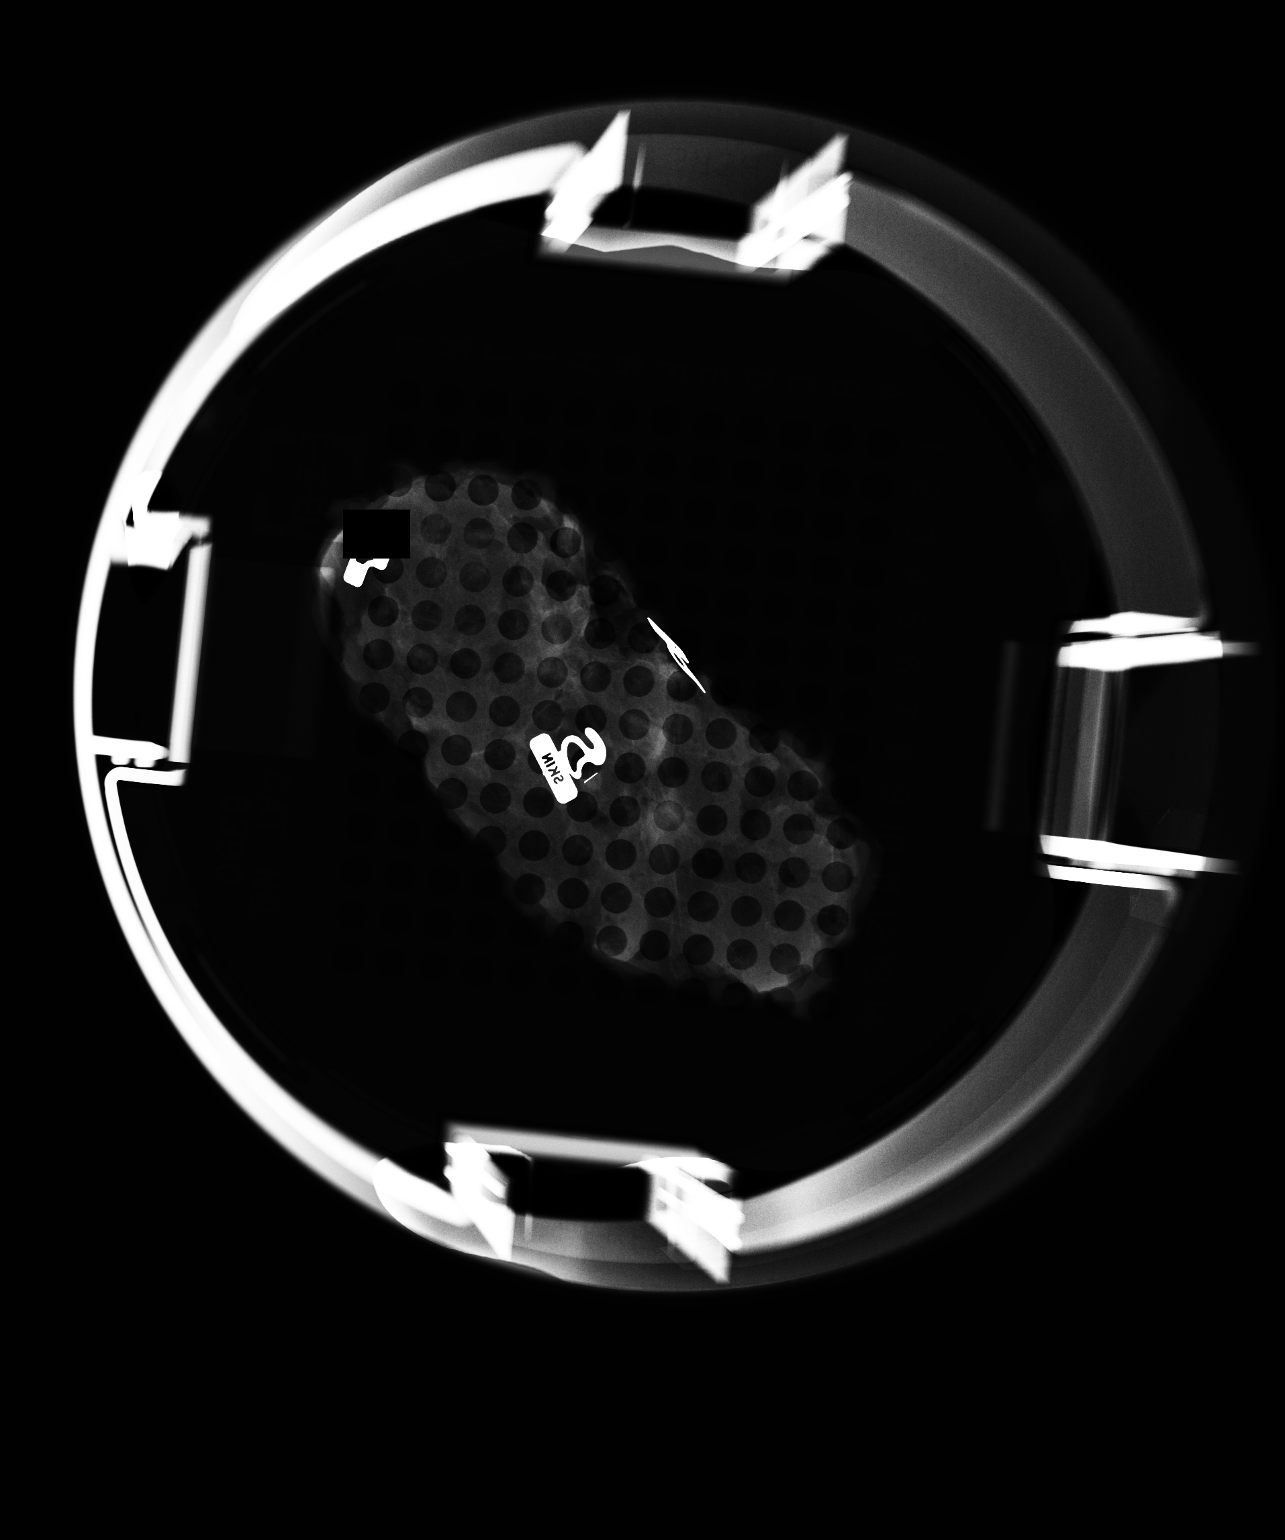

[1 of 1 positions shown; findings below may reference images not displayed]

Canned report from images found in remote index.

Refer to host system for actual result text.

## 2014-11-18 DIAGNOSIS — M1712 Unilateral primary osteoarthritis, left knee: Secondary | ICD-10-CM | POA: Diagnosis not present

## 2014-11-20 ENCOUNTER — Emergency Department: Payer: Medicare Other

## 2014-11-20 ENCOUNTER — Emergency Department
Admission: EM | Admit: 2014-11-20 | Discharge: 2014-11-20 | Disposition: A | Discharge status: Home / Self Care | Payer: Medicare Other | Attending: Emergency Medicine | Admitting: Emergency Medicine

## 2014-11-20 ENCOUNTER — Encounter: Payer: Self-pay | Admitting: General Practice

## 2014-11-20 DIAGNOSIS — Y9389 Activity, other specified: Secondary | ICD-10-CM | POA: Insufficient documentation

## 2014-11-20 DIAGNOSIS — Z792 Long term (current) use of antibiotics: Secondary | ICD-10-CM | POA: Insufficient documentation

## 2014-11-20 DIAGNOSIS — Y9289 Other specified places as the place of occurrence of the external cause: Secondary | ICD-10-CM | POA: Insufficient documentation

## 2014-11-20 DIAGNOSIS — S8992XA Unspecified injury of left lower leg, initial encounter: Secondary | ICD-10-CM | POA: Insufficient documentation

## 2014-11-20 DIAGNOSIS — Z79899 Other long term (current) drug therapy: Secondary | ICD-10-CM | POA: Insufficient documentation

## 2014-11-20 DIAGNOSIS — X58XXXA Exposure to other specified factors, initial encounter: Secondary | ICD-10-CM | POA: Diagnosis not present

## 2014-11-20 DIAGNOSIS — M25562 Pain in left knee: Secondary | ICD-10-CM | POA: Diagnosis not present

## 2014-11-20 DIAGNOSIS — Z7982 Long term (current) use of aspirin: Secondary | ICD-10-CM | POA: Insufficient documentation

## 2014-11-20 DIAGNOSIS — Y998 Other external cause status: Secondary | ICD-10-CM | POA: Diagnosis not present

## 2014-11-20 HISTORY — DX: Unspecified osteoarthritis, unspecified site: M19.90

## 2014-11-20 MED ORDER — OXYCODONE-ACETAMINOPHEN 5-325 MG PO TABS
2.0000 | ORAL_TABLET | Freq: Once | ORAL | Status: AC
  Administered 2014-11-20: 2 via ORAL

## 2014-11-20 MED ORDER — OXYCODONE-ACETAMINOPHEN 5-325 MG PO TABS
ORAL_TABLET | ORAL | Status: AC
  Filled 2014-11-20: qty 2

## 2014-11-20 NOTE — ED Notes (Addendum)
NAD noted at time of D/C. Pt denies questions or concerns. Pt taken to the lobby at this time via wheelchair.   

## 2014-11-20 NOTE — ED Notes (Signed)
Pt. Arrived to ed from home with reports of left knee pain. Pt reprots that she was seen on Friday by Orthopedic and was diagnosis with "mild arthritis" . Pt reports she was attempting to get in her truck this AM and heard a "pop" and has experienced increase pain to left knee since. Alert and oriented. Reports increase pain when attempting to ambulate.

## 2014-11-20 NOTE — Discharge Instructions (Signed)
Take home medication as prescribed. Apply ice and elevate. Use knee immobilizer and walker at home for stability and support as long as pain continues.   Follow up with your orthopedic this week for continued pain as discussed.   Return to ER for new or worsening concerns.   Knee Pain The knee is the complex joint between your thigh and your lower leg. It is made up of bones, tendons, ligaments, and cartilage. The bones that make up the knee are:  The femur in the thigh.  The tibia and fibula in the lower leg.  The patella or kneecap riding in the groove on the lower femur. CAUSES  Knee pain is a common complaint with many causes. A few of these causes are:  Injury, such as:  A ruptured ligament or tendon injury.  Torn cartilage.  Medical conditions, such as:  Gout  Arthritis  Infections  Overuse, over training, or overdoing a physical activity. Knee pain can be minor or severe. Knee pain can accompany debilitating injury. Minor knee problems often respond well to self-care measures or get well on their own. More serious injuries may need medical intervention or even surgery. SYMPTOMS The knee is complex. Symptoms of knee problems can vary widely. Some of the problems are:  Pain with movement and weight bearing.  Swelling and tenderness.  Buckling of the knee.  Inability to straighten or extend your knee.  Your knee locks and you cannot straighten it.  Warmth and redness with pain and fever.  Deformity or dislocation of the kneecap. DIAGNOSIS  Determining what is wrong may be very straight forward such as when there is an injury. It can also be challenging because of the complexity of the knee. Tests to make a diagnosis may include:  Your caregiver taking a history and doing a physical exam.  Routine X-rays can be used to rule out other problems. X-rays will not reveal a cartilage tear. Some injuries of the knee can be diagnosed by:  Arthroscopy a surgical  technique by which a small video camera is inserted through tiny incisions on the sides of the knee. This procedure is used to examine and repair internal knee joint problems. Tiny instruments can be used during arthroscopy to repair the torn knee cartilage (meniscus).  Arthrography is a radiology technique. A contrast liquid is directly injected into the knee joint. Internal structures of the knee joint then become visible on X-ray film.  An MRI scan is a non X-ray radiology procedure in which magnetic fields and a computer produce two- or three-dimensional images of the inside of the knee. Cartilage tears are often visible using an MRI scanner. MRI scans have largely replaced arthrography in diagnosing cartilage tears of the knee.  Blood work.  Examination of the fluid that helps to lubricate the knee joint (synovial fluid). This is done by taking a sample out using a needle and a syringe. TREATMENT The treatment of knee problems depends on the cause. Some of these treatments are:  Depending on the injury, proper casting, splinting, surgery, or physical therapy care will be needed.  Give yourself adequate recovery time. Do not overuse your joints. If you begin to get sore during workout routines, back off. Slow down or do fewer repetitions.  For repetitive activities such as cycling or running, maintain your strength and nutrition.  Alternate muscle groups. For example, if you are a weight lifter, work the upper body on one day and the lower body the next.  Either tight  or weak muscles do not give the proper support for your knee. Tight or weak muscles do not absorb the stress placed on the knee joint. Keep the muscles surrounding the knee strong.  Take care of mechanical problems.  If you have flat feet, orthotics or special shoes may help. See your caregiver if you need help.  Arch supports, sometimes with wedges on the inner or outer aspect of the heel, can help. These can shift  pressure away from the side of the knee most bothered by osteoarthritis.  A brace called an "unloader" brace also may be used to help ease the pressure on the most arthritic side of the knee.  If your caregiver has prescribed crutches, braces, wraps or ice, use as directed. The acronym for this is PRICE. This means protection, rest, ice, compression, and elevation.  Nonsteroidal anti-inflammatory drugs (NSAIDs), can help relieve pain. But if taken immediately after an injury, they may actually increase swelling. Take NSAIDs with food in your stomach. Stop them if you develop stomach problems. Do not take these if you have a history of ulcers, stomach pain, or bleeding from the bowel. Do not take without your caregiver's approval if you have problems with fluid retention, heart failure, or kidney problems.  For ongoing knee problems, physical therapy may be helpful.  Glucosamine and chondroitin are over-the-counter dietary supplements. Both may help relieve the pain of osteoarthritis in the knee. These medicines are different from the usual anti-inflammatory drugs. Glucosamine may decrease the rate of cartilage destruction.  Injections of a corticosteroid drug into your knee joint may help reduce the symptoms of an arthritis flare-up. They may provide pain relief that lasts a few months. You may have to wait a few months between injections. The injections do have a small increased risk of infection, water retention, and elevated blood sugar levels.  Hyaluronic acid injected into damaged joints may ease pain and provide lubrication. These injections may work by reducing inflammation. A series of shots may give relief for as long as 6 months.  Topical painkillers. Applying certain ointments to your skin may help relieve the pain and stiffness of osteoarthritis. Ask your pharmacist for suggestions. Many over the-counter products are approved for temporary relief of arthritis pain.  In some countries,  doctors often prescribe topical NSAIDs for relief of chronic conditions such as arthritis and tendinitis. A review of treatment with NSAID creams found that they worked as well as oral medications but without the serious side effects. PREVENTION  Maintain a healthy weight. Extra pounds put more strain on your joints.  Get strong, stay limber. Weak muscles are a common cause of knee injuries. Stretching is important. Include flexibility exercises in your workouts.  Be smart about exercise. If you have osteoarthritis, chronic knee pain or recurring injuries, you may need to change the way you exercise. This does not mean you have to stop being active. If your knees ache after jogging or playing basketball, consider switching to swimming, water aerobics, or other low-impact activities, at least for a few days a week. Sometimes limiting high-impact activities will provide relief.  Make sure your shoes fit well. Choose footwear that is right for your sport.  Protect your knees. Use the proper gear for knee-sensitive activities. Use kneepads when playing volleyball or laying carpet. Buckle your seat belt every time you drive. Most shattered kneecaps occur in car accidents.  Rest when you are tired. SEEK MEDICAL CARE IF:  You have knee pain that is continual and  does not seem to be getting better.  SEEK IMMEDIATE MEDICAL CARE IF:  Your knee joint feels hot to the touch and you have a high fever. MAKE SURE YOU:   Understand these instructions.  Will watch your condition.  Will get help right away if you are not doing well or get worse. Document Released: 03/24/2007 Document Revised: 08/19/2011 Document Reviewed: 03/24/2007 [facility] Patient Information 2015 [place], [place]. This information is not intended to replace advice given to you by your health care provider. Make sure you discuss any questions you have with your health care provider.

## 2014-11-20 NOTE — ED Provider Notes (Signed)
[facility] Emergency Department Provider Note  ____________________________________________  Time seen: Approximately 2:39 PM  I have reviewed the triage vital signs and the nursing notes.   HISTORY  Chief Complaint Knee Pain   HPI Annette Hartner is a 64 y.o. female presents to ER with spouse at bedside for the complaint of left lateral knee pain. Patient states that she has been having some issues with her left knee and been followed by Dr. Tamala Julian orthopedist. However states that this morning she was going to get in the truck and she lifted and twisted her left leg to get in the truck and heard a pop and had sudden increase in pain. Patient states that she has had persistent pain since. Patient states that she did continue to go ahead to church however she still had pain afterwards which what prompted her to the ER to be seen.  Patient denies fall or other injury. States pain is 7 out of 10 to the left lateral knee. Denies pain radiation.States pain is only with movement.  Denies calf pain. Denies lower leg swelling. Denies chest pain, shortness breath, abdominal pain, fever or weakness. States felt like her knee is going to give out if she puts weight on it.  States that she was seen by Dr. Margaretmary Eddy orthopedic this past Friday and told that she had arthritis in her left knee. Patient states that she is currently taking 15 mg daily mode take for left knee pain as well as she was given a prescription for as needed Norco.    Past Medical History  Diagnosis Date  . Myocardial infarction 07/1998  . GERD (gastroesophageal reflux disease)   . 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 Negative margins on resection. MammoSite  . 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 Oncotype DX recurrent score: 7% with antiestrogen alone. MammoSite.   . Arthritis     Patient Active Problem List   Diagnosis Date Noted  . 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 Breast cancer, stage 1, estrogen receptor positive 04/04/2014  . Carcinoma in situ of breast 03/03/2014  . Breast cancer of upper-outer quadrant of left female breast 12/23/2013  . Cancer of lower-inner quadrant of left female breast 11/15/2013  . DCIS (ductal carcinoma in situ) 03/30/2013    Past Surgical History  Procedure Laterality Date  . Breast mammosite  05/20/13  . Breast biopsy Left 03/22/13     stereo  . Breast surgery Left 04/29/13    wide excision  . Breast biopsy Left 11/11/13    INVASIVE MAMMARY CARCINOMA  . Breast surgery Left 12-16-13    Wide excision with SN biopsy, INVASIVE MAMMARY CARCINOMA  . Colonoscopy  2006  . Hand surgery      Current Outpatient Rx  Name  Route  Sig  Dispense  Refill  . aspirin 81 MG tablet   Oral   Take 81 mg by mouth daily.         Marland Kitchen atorvastatin (LIPITOR) 40 MG tablet   Oral   Take 1 tablet by mouth daily.         . Calcium Carbonate (CALCIUM 600 PO)   Oral   Take 1 tablet by mouth 2 (two) times daily.         Marland Kitchen  Cholecalciferol (VITAMIN D3) 1000 UNITS CAPS   Oral   Take 1 capsule by mouth daily.         . clindamycin (CLEOCIN-T) 1 % external solution      Apply to affected area 2 times daily   60 mL   0   . CRESTOR 40 MG tablet   Oral   Take 1 tablet by mouth daily.         Marland Kitchen docusate sodium (COLACE) 100 MG capsule   Oral   Take 100 mg by mouth daily.         Marland Kitchen ibuprofen (ADVIL,MOTRIN) 800 MG tablet   Oral   Take 1 tablet by mouth as needed.         . isosorbide mononitrate (IMDUR) 60 MG 24 hr tablet   Oral   Take 1 tablet by mouth daily.         Marland Kitchen letrozole (FEMARA) 2.5 MG tablet   Oral   Take 1 tablet (2.5 mg total) by mouth daily.   30 tablet   12   .  metoprolol succinate (TOPROL-XL) 50 MG 24 hr tablet   Oral   Take 1 tablet by mouth daily.         . niacin (NIASPAN) 1000 MG CR tablet   Oral   Take 1 tablet by mouth daily.         . nitroGLYCERIN (NITROSTAT) 0.4 MG SL tablet   Sublingual   Place 0.4 mg under the tongue every 5 (five) minutes as needed for chest pain.         . ranitidine (ZANTAC) 75 MG tablet   Oral   Take 75 mg by mouth at bedtime.         . senna-docusate (STOOL SOFTENER & LAXATIVE) 8.6-50 MG per tablet   Oral   Take 1 tablet by mouth daily.         Marland Kitchen SYNTHROID 125 MCG tablet   Oral   Take 1 tablet by mouth daily.         . vitamin B-12 (CYANOCOBALAMIN) 250 MCG tablet   Oral   Take 250 mcg by mouth daily.         Marland Kitchen ZETIA 10 MG tablet   Oral   Take 1 tablet by mouth daily.           Allergies Dilaudid and Tape  No family history on file.  Social History History  Substance Use Topics  . Smoking status: Never Smoker   . Smokeless tobacco: Never Used  . Alcohol Use: Yes     Comment: causal    Review of Systems Constitutional: No fever/chills Eyes: No visual changes. ENT: No sore throat. Cardiovascular: Denies chest pain. Respiratory: Denies shortness of breath. Gastrointestinal: No abdominal pain.  No nausea, no vomiting.  No diarrhea.  No constipation. Genitourinary: Negative for dysuria. Musculoskeletal: Negative for back pain. Left knee pain. States knee feels like it'll give out with weight. Skin: Negative for rash. Neurological: Negative for headaches, focal weakness or numbness.  10-point ROS otherwise negative.  ____________________________________________   PHYSICAL EXAM:  VITAL SIGNS: ED Triage Vitals  Enc Vitals Group     BP 11/20/14 1249 140/97 mmHg     Pulse Rate 11/20/14 1249 66     Resp 11/20/14 1249 18     Temp 11/20/14 1249 98 F (36.7 C)     Temp src --      SpO2 11/20/14 1249 97 %  Weight 11/20/14 1249 225 lb (102.059 kg)     Height  11/20/14 1249 '5\' 3"'  (1.6 m)     Head Cir --      Peak Flow --      Pain Score 11/20/14 1250 10     Pain Loc --      Pain Edu? --      Excl. in [place]? --     Constitutional: Alert and oriented. Well appearing and in no acute distress. Eyes: Conjunctivae are normal. PERRL. EOMI. Head: Atraumatic. Nose: No congestion/rhinnorhea. Mouth/Throat: Mucous membranes are moist.  Oropharynx non-erythematous. Neck: No stridor.  No cervical spine tenderness to palpation. Hematological/Lymphatic/Immunilogical: No cervical lymphadenopathy. Cardiovascular: Normal rate, regular rhythm. Grossly normal heart sounds.  Good peripheral circulation. Respiratory: Normal respiratory effort.  No retractions. Lungs CTAB. Gastrointestinal: Soft and nontender. No distention.  Musculoskeletal: No lower extremity tenderness nor edema.   Except: left lateral and anterior knee mod TTP, with minimal swelling. Pain with flexion. No pain with anterior or posterior drawer test. Mod pain with lateral and medial stress test. No calf tenderness. No tenderness below or above knee. Pedal pulses equal bilateral and easily found.  Neurologic:  Normal speech and language. No gross focal neurologic deficits are appreciated. Speech is normal. No gait instability. Skin:  Skin is warm, dry and intact. No rash noted. Psychiatric: Mood and affect are normal. Speech and behavior are normal.  ____________________________________________  RADIOLOGY  LEFT KNEE - COMPLETE 4+ VIEW  COMPARISON: Radiographs 04/26/2013.  FINDINGS: The mineralization and alignment are normal. There is no evidence of acute fracture or dislocation. There is stable mild medial compartment joint space loss. No joint effusion or focal soft tissue abnormality identified. Calcifications medially in the distal thigh are probably vascular.  IMPRESSION: No acute osseous findings. Stable mild medial compartment joint space loss.   Electronically Signed By:  Richardean Sale M.D. On: 11/20/2014 15:17 ____________________________________________   PROCEDURES  Procedure(s) performed: SPLINT APPLICATION Date/Time: 1:63 PM Authorized by: Marylene Land Consent: Verbal consent obtained. Risks and benefits: risks, benefits and alternatives were discussed Consent given by: patient Splint applied by: ed technician Location details: left knee immobilizer Post-procedure: The splinted body part was neurovascularly unchanged following the procedure. Patient tolerance: Patient tolerated the procedure well with no immediate complications.  Reports has pronged walker at home.   ____________________________________________   INITIAL IMPRESSION / ASSESSMENT AND PLAN / ED COURSE  Pertinent labs & imaging results that were available during my care of the patient were reviewed by me and considered in my medical decision making (see chart for details).  No acute distress. Very well-appearing patient. Patient spouse presents to the ER for complaints of left lateral and anterior knee pain. Patient states acute onset of pain while lifting left knee to get into truck and had to lift and twist. States felt a pop and has had increased pain since. Denies fall or other injury. Patient with palpable left lateral and anterior knee pain. Concern for internal injury. X-ray negative for acute changes. Discussed will immobilize as well as use walker for stabilization. Follow-up with primary care physician or orthopedic this week. Pt agreed to plan. ____________________________________________   FINAL CLINICAL IMPRESSION(S) / ED DIAGNOSES  Final diagnoses:  Knee pain, acute, left      Marylene Land, NP 11/20/14 1627  Lavonia Drafts, MD 11/21/14 1744

## 2014-11-25 ENCOUNTER — Other Ambulatory Visit: Payer: Self-pay | Admitting: Specialist

## 2014-11-25 DIAGNOSIS — M25562 Pain in left knee: Secondary | ICD-10-CM

## 2014-12-01 ENCOUNTER — Ambulatory Visit
Admission: RE | Admit: 2014-12-01 | Discharge: 2014-12-01 | Disposition: A | Discharge status: Home / Self Care | Payer: Medicare Other | Source: Ambulatory Visit | Attending: Specialist | Admitting: Specialist

## 2014-12-01 DIAGNOSIS — S83232A Complex tear of medial meniscus, current injury, left knee, initial encounter: Secondary | ICD-10-CM | POA: Insufficient documentation

## 2014-12-01 DIAGNOSIS — M25562 Pain in left knee: Secondary | ICD-10-CM

## 2014-12-01 DIAGNOSIS — S83242A Other tear of medial meniscus, current injury, left knee, initial encounter: Secondary | ICD-10-CM | POA: Diagnosis not present

## 2014-12-01 DIAGNOSIS — W19XXXA Unspecified fall, initial encounter: Secondary | ICD-10-CM | POA: Insufficient documentation

## 2014-12-04 ENCOUNTER — Other Ambulatory Visit: Payer: Self-pay | Admitting: Family Medicine

## 2014-12-06 DIAGNOSIS — S83232D Complex tear of medial meniscus, current injury, left knee, subsequent encounter: Secondary | ICD-10-CM | POA: Diagnosis not present

## 2014-12-08 ENCOUNTER — Telehealth: Payer: Self-pay

## 2014-12-08 MED ORDER — LEVOTHYROXINE SODIUM 125 MCG PO TABS: 125.0000 ug | ORAL_TABLET | Freq: Every day | ORAL | Status: DC

## 2014-12-08 NOTE — Telephone Encounter (Signed)
Patient need Rx for 1 month of Synthroid sent to her local pharmacy, [facility] in [place] until her rescheduled appt. With Dr. Jeananne Rama.

## 2014-12-08 NOTE — Telephone Encounter (Signed)
Patient has new insurance, not set up yet. She needs 30 day Rx for Synthroid 11mcg sent to [facility]

## 2014-12-21 ENCOUNTER — Other Ambulatory Visit: Payer: Self-pay | Admitting: Family Medicine

## 2014-12-21 ENCOUNTER — Ambulatory Visit (INDEPENDENT_AMBULATORY_CARE_PROVIDER_SITE_OTHER): Payer: Medicare Other | Admitting: Family Medicine

## 2014-12-21 ENCOUNTER — Encounter: Payer: Self-pay | Admitting: Family Medicine

## 2014-12-21 VITALS — BP 134/84 | HR 73 | Temp 97.9°F | Ht 60.7 in | Wt 232.0 lb

## 2014-12-21 DIAGNOSIS — E034 Atrophy of thyroid (acquired): Secondary | ICD-10-CM | POA: Diagnosis not present

## 2014-12-21 DIAGNOSIS — N952 Postmenopausal atrophic vaginitis: Secondary | ICD-10-CM | POA: Insufficient documentation

## 2014-12-21 DIAGNOSIS — Z23 Encounter for immunization: Secondary | ICD-10-CM | POA: Diagnosis not present

## 2014-12-21 DIAGNOSIS — E785 Hyperlipidemia, unspecified: Secondary | ICD-10-CM

## 2014-12-21 DIAGNOSIS — I2583 Coronary atherosclerosis due to lipid rich plaque: Secondary | ICD-10-CM

## 2014-12-21 DIAGNOSIS — Z0000 Encounter for general adult medical examination without abnormal findings: Secondary | ICD-10-CM | POA: Diagnosis not present

## 2014-12-21 DIAGNOSIS — E038 Other specified hypothyroidism: Secondary | ICD-10-CM | POA: Diagnosis not present

## 2014-12-21 DIAGNOSIS — K219 Gastro-esophageal reflux disease without esophagitis: Secondary | ICD-10-CM | POA: Diagnosis not present

## 2014-12-21 DIAGNOSIS — M1712 Unilateral primary osteoarthritis, left knee: Secondary | ICD-10-CM | POA: Insufficient documentation

## 2014-12-21 DIAGNOSIS — I2510 Atherosclerotic heart disease of native coronary artery without angina pectoris: Secondary | ICD-10-CM

## 2014-12-21 DIAGNOSIS — I10 Essential (primary) hypertension: Secondary | ICD-10-CM | POA: Diagnosis not present

## 2014-12-21 DIAGNOSIS — M129 Arthropathy, unspecified: Secondary | ICD-10-CM

## 2014-12-21 DIAGNOSIS — R319 Hematuria, unspecified: Secondary | ICD-10-CM

## 2014-12-21 DIAGNOSIS — I776 Arteritis, unspecified: Secondary | ICD-10-CM

## 2014-12-21 LAB — MICROSCOPIC EXAMINATION

## 2014-12-21 LAB — URINALYSIS, ROUTINE W REFLEX MICROSCOPIC
Bilirubin, UA: NEGATIVE
GLUCOSE, UA: NEGATIVE
Ketones, UA: NEGATIVE
NITRITE UA: NEGATIVE
PROTEIN UA: NEGATIVE
RBC UA: NEGATIVE
SPEC GRAV UA: 1.01 (ref 1.005–1.030)
Urobilinogen, Ur: 0.2 mg/dL (ref 0.2–1.0)
pH, UA: 7 (ref 5.0–7.5)

## 2014-12-21 MED ORDER — IBUPROFEN 800 MG PO TABS: 800.0000 mg | ORAL_TABLET | ORAL | Status: DC | PRN

## 2014-12-21 MED ORDER — PNEUMOCOCCAL 13-VAL CONJ VACC IM SUSP
0.5000 mL | INTRAMUSCULAR | Status: AC
  Administered 2014-12-21: 0.5 mL via INTRAMUSCULAR

## 2014-12-21 MED ORDER — EZETIMIBE 10 MG PO TABS: 10.0000 mg | ORAL_TABLET | Freq: Every day | ORAL | Status: DC

## 2014-12-21 MED ORDER — LEVOTHYROXINE SODIUM 125 MCG PO TABS: 125.0000 ug | ORAL_TABLET | Freq: Every day | ORAL | Status: DC

## 2014-12-21 MED ORDER — TETANUS-DIPHTHERIA TOXOIDS TD 5-2 LFU IM INJ
0.5000 mL | INJECTION | Freq: Once | INTRAMUSCULAR | Status: AC
  Administered 2014-12-21: 0.5 mL via INTRAMUSCULAR

## 2014-12-21 MED ORDER — ATORVASTATIN CALCIUM 40 MG PO TABS: 40.0000 mg | ORAL_TABLET | Freq: Every day | ORAL | Status: DC

## 2014-12-21 MED ORDER — NIACIN ER (ANTIHYPERLIPIDEMIC) 1000 MG PO TBCR: 1000.0000 mg | EXTENDED_RELEASE_TABLET | Freq: Every day | ORAL | Status: DC

## 2014-12-21 NOTE — Assessment & Plan Note (Signed)
The current medical regimen is effective;  continue present plan and medications.  

## 2014-12-21 NOTE — Assessment & Plan Note (Signed)
Working with Dr Tamala Julian doing better

## 2014-12-21 NOTE — Progress Notes (Signed)
BP 134/84 mmHg  Pulse 73  Temp(Src) 97.9 F (36.6 C)  Ht 5' 0.7" (1.542 m)  Wt 232 lb (105.235 kg)  BMI 44.26 kg/m2  SpO2 98%   Subjective:    Patient ID: Annette Porter, female    DOB: January 13, 1951, 64 y.o.   MRN: [id number]  HPI: Annette Porter is a 64 y.o. female  Chief Complaint  Patient presents with  . Annual Exam  AWV done with falls depression etc [facility] has not entered computer forms yet Welcome to [facility] visit done    HAS SOME SLIGHT BLADDER LEAKAGE Esp with sneeze or cough  Doing well with all medicine. Takes everyday with no side effects. Stable from last visit. Medical problems reviewed and stable  Relevant past medical, surgical, family and social history reviewed and updated as indicated. Interim medical history since our last visit reviewed. Allergies and medications reviewed and updated.  Review of Systems  Constitutional: Negative.   HENT: Negative.   Eyes: Negative.   Respiratory: Negative.   Cardiovascular: Negative.   Gastrointestinal: Negative.   Endocrine: Negative.   Genitourinary: Negative.   Musculoskeletal: Negative.        Knee pain see ortho notes  Skin: Positive for wound.       Wound on leg needs dT  Allergic/Immunologic: Negative.   Neurological: Negative.   Hematological: Negative.   Psychiatric/Behavioral: Negative.     Per HPI unless specifically indicated above     Objective:    BP 134/84 mmHg  Pulse 73  Temp(Src) 97.9 F (36.6 C)  Ht 5' 0.7" (1.542 m)  Wt 232 lb (105.235 kg)  BMI 44.26 kg/m2  SpO2 98%  Wt Readings from Last 3 Encounters:  12/21/14 232 lb (105.235 kg)  05/18/14 222 lb (100.699 kg)  12/01/14 225 lb (102.059 kg)    Physical Exam  Constitutional: She is oriented to person, place, and time. She appears well-developed and well-nourished.  HENT:  Head: Normocephalic and atraumatic.  Right Ear: External ear normal.  Left Ear: External ear normal.  Nose: Nose normal.  Mouth/Throat:  Oropharynx is clear and moist.  Eyes: Conjunctivae and EOM are normal. Pupils are equal, round, and reactive to light.  Neck: Normal range of motion. Neck supple. Carotid bruit is not present.  Cardiovascular: Normal rate, regular rhythm and normal heart sounds.   No murmur heard. Pulmonary/Chest: Effort normal and breath sounds normal. Right breast exhibits no mass and no tenderness. Left breast exhibits no mass and no tenderness. Breasts are symmetrical.  Abdominal: Soft. Bowel sounds are normal. There is no hepatosplenomegaly.  Genitourinary:  Atrophic vaginitis changes  Musculoskeletal: Normal range of motion.  Neurological: She is alert and oriented to person, place, and time.  Skin: No rash noted.  Psychiatric: She has a normal mood and affect. Her behavior is normal. Judgment and thought content normal.        Assessment & Plan:   Problem List Items Addressed This Visit      Cardiovascular and Mediastinum   Hypertension    The current medical regimen is effective;  continue present plan and medications.       Relevant Medications   isosorbide mononitrate (IMDUR) 30 MG 24 hr tablet   niacin (NIASPAN) 1000 MG CR tablet   ezetimibe (ZETIA) 10 MG tablet   atorvastatin (LIPITOR) 40 MG tablet   Other Relevant Orders   CBC with Differential/Platelet   Comprehensive metabolic panel   Lipid panel   TSH   Urinalysis,  Routine w reflex microscopic (not at [facility])   CAD (coronary artery disease)    The current medical regimen is effective;  continue present plan and medications.       Relevant Medications   isosorbide mononitrate (IMDUR) 30 MG 24 hr tablet   niacin (NIASPAN) 1000 MG CR tablet   ezetimibe (ZETIA) 10 MG tablet   atorvastatin (LIPITOR) 40 MG tablet   Other Relevant Orders   CBC with Differential/Platelet   Comprehensive metabolic panel   Lipid panel   TSH   Urinalysis, Routine w reflex microscopic (not at [facility])     Musculoskeletal and Integument    Arthritis of left knee    Working with Dr Tamala Julian doing better      Relevant Orders   CBC with Differential/Platelet   Comprehensive metabolic panel   Lipid panel   TSH   Urinalysis, Routine w reflex microscopic (not at [facility])     Genitourinary   Atrophic vaginitis     Other   Hyperlipidemia - Primary    The current medical regimen is effective;  continue present plan and medications.       Relevant Medications   isosorbide mononitrate (IMDUR) 30 MG 24 hr tablet   niacin (NIASPAN) 1000 MG CR tablet   ezetimibe (ZETIA) 10 MG tablet   atorvastatin (LIPITOR) 40 MG tablet   Other Relevant Orders   CBC with Differential/Platelet   Comprehensive metabolic panel   Lipid panel   TSH   Urinalysis, Routine w reflex microscopic (not at [facility])    Other Visit Diagnoses    PE (physical exam), annual        Relevant Orders    HM COLONOSCOPY (Completed)    [place] Screening AAA    CBC with Differential/Platelet    Comprehensive metabolic panel    Lipid panel    TSH    Urinalysis, Routine w reflex microscopic (not at [facility])    Welcome to [facility] preventive visit        Relevant Orders    EKG 12-Lead (Completed)    CBC with Differential/Platelet    Comprehensive metabolic panel    Lipid panel    TSH    Urinalysis, Routine w reflex microscopic (not at [facility])    Immunization due        Relevant Medications    tetanus & diphtheria toxoids (adult) (TENIVAC) injection 0.5 mL    pneumococcal 13-valent conjugate vaccine (PREVNAR 13) injection 0.5 mL (Start on 12/22/2014 10:00 AM)      discussed living will etc   Follow up plan: Return in about 6 months (around 06/23/2015), or if symptoms worsen or fail to improve, for BMP, lipids, alt, ast.

## 2014-12-22 LAB — COMPREHENSIVE METABOLIC PANEL
ALT: 26 IU/L (ref 0–32)
AST: 22 IU/L (ref 0–40)
Albumin/Globulin Ratio: 1.8 (ref 1.1–2.5)
Albumin: 4.2 g/dL (ref 3.6–4.8)
Alkaline Phosphatase: 68 IU/L (ref 39–117)
BUN / CREAT RATIO: 16 (ref 11–26)
BUN: 11 mg/dL (ref 8–27)
Bilirubin Total: 0.3 mg/dL (ref 0.0–1.2)
CO2: 21 mmol/L (ref 18–29)
Calcium: 9.4 mg/dL (ref 8.7–10.3)
Chloride: 100 mmol/L (ref 97–108)
Creatinine, Ser: 0.69 mg/dL (ref 0.57–1.00)
GFR calc non Af Amer: 92 mL/min/{1.73_m2} (ref >59)
GFR, EST AFRICAN AMERICAN: 106 mL/min/{1.73_m2} (ref >59)
GLUCOSE: 90 mg/dL (ref 65–99)
Globulin, Total: 2.3 g/dL (ref 1.5–4.5)
Potassium: 4.5 mmol/L (ref 3.5–5.2)
Sodium: 140 mmol/L (ref 134–144)
Total Protein: 6.5 g/dL (ref 6.0–8.5)

## 2014-12-22 LAB — CBC WITH DIFFERENTIAL/PLATELET
Basophils Absolute: 0 10*3/uL (ref 0.0–0.2)
Basos: 1 %
EOS (ABSOLUTE): 0.3 10*3/uL (ref 0.0–0.4)
EOS: 4 %
Hematocrit: 39 % (ref 34.0–46.6)
Hemoglobin: 12.9 g/dL (ref 11.1–15.9)
IMMATURE GRANULOCYTES: 0 %
Immature Grans (Abs): 0 10*3/uL (ref 0.0–0.1)
LYMPHS ABS: 2.2 10*3/uL (ref 0.7–3.1)
LYMPHS: 28 %
MCH: 30.4 pg (ref 26.6–33.0)
MCHC: 33.1 g/dL (ref 31.5–35.7)
MCV: 92 fL (ref 79–97)
MONOCYTES: 7 %
MONOS ABS: 0.6 10*3/uL (ref 0.1–0.9)
NEUTROS ABS: 4.7 10*3/uL (ref 1.4–7.0)
NEUTROS PCT: 60 %
Platelets: 249 10*3/uL (ref 150–379)
RBC: 4.24 x10E6/uL (ref 3.77–5.28)
RDW: 14.1 % (ref 12.3–15.4)
WBC: 7.7 10*3/uL (ref 3.4–10.8)

## 2014-12-22 LAB — LIPID PANEL
Chol/HDL Ratio: 2.4 ratio units (ref 0.0–4.4)
Cholesterol, Total: 144 mg/dL (ref 100–199)
HDL: 60 mg/dL (ref >39)
LDL Calculated: 61 mg/dL (ref 0–99)
Triglycerides: 114 mg/dL (ref 0–149)
VLDL Cholesterol Cal: 23 mg/dL (ref 5–40)

## 2014-12-22 LAB — TSH: TSH: 3.52 u[IU]/mL (ref 0.450–4.500)

## 2014-12-27 ENCOUNTER — Encounter: Payer: Self-pay | Admitting: Family Medicine

## 2014-12-27 ENCOUNTER — Ambulatory Visit
Admission: RE | Admit: 2014-12-27 | Discharge: 2014-12-27 | Disposition: A | Discharge status: Home / Self Care | Payer: Medicare Other | Source: Ambulatory Visit | Attending: Family Medicine | Admitting: Family Medicine

## 2014-12-27 DIAGNOSIS — I77811 Abdominal aortic ectasia: Secondary | ICD-10-CM | POA: Diagnosis not present

## 2014-12-27 DIAGNOSIS — Z136 Encounter for screening for cardiovascular disorders: Secondary | ICD-10-CM | POA: Diagnosis not present

## 2014-12-27 DIAGNOSIS — Z0000 Encounter for general adult medical examination without abnormal findings: Secondary | ICD-10-CM | POA: Diagnosis not present

## 2014-12-27 NOTE — Progress Notes (Signed)
Phone call with patient about abdominal aortic ultrasound screening requesting repeat in 5 years Sent a reminder to myself and asked the patient to remember also

## 2014-12-28 ENCOUNTER — Telehealth: Payer: Self-pay | Admitting: Family Medicine

## 2014-12-28 NOTE — Telephone Encounter (Signed)
E-fax came through for refill on: Rx: ibuprofen (ADVIL,MOTRIN) 800 MG tablet Copy of Rx in basket

## 2014-12-28 NOTE — Telephone Encounter (Signed)
Form filled out and sent back for directions TID

## 2015-01-30 DIAGNOSIS — S83232D Complex tear of medial meniscus, current injury, left knee, subsequent encounter: Secondary | ICD-10-CM | POA: Diagnosis not present

## 2015-02-08 ENCOUNTER — Encounter: Payer: Self-pay | Admitting: Family Medicine

## 2015-02-08 ENCOUNTER — Encounter
Admission: RE | Admit: 2015-02-08 | Discharge: 2015-02-08 | Disposition: A | Discharge status: Home / Self Care | Payer: Medicare Other | Source: Ambulatory Visit | Attending: Specialist | Admitting: Specialist

## 2015-02-08 ENCOUNTER — Ambulatory Visit (INDEPENDENT_AMBULATORY_CARE_PROVIDER_SITE_OTHER): Payer: Medicare Other | Admitting: Family Medicine

## 2015-02-08 VITALS — BP 134/73 | HR 80 | Temp 98.1°F | Ht 61.5 in | Wt 232.0 lb

## 2015-02-08 DIAGNOSIS — I2510 Atherosclerotic heart disease of native coronary artery without angina pectoris: Secondary | ICD-10-CM | POA: Diagnosis not present

## 2015-02-08 DIAGNOSIS — M1712 Unilateral primary osteoarthritis, left knee: Secondary | ICD-10-CM

## 2015-02-08 DIAGNOSIS — Z01818 Encounter for other preprocedural examination: Secondary | ICD-10-CM | POA: Insufficient documentation

## 2015-02-08 DIAGNOSIS — I2583 Coronary atherosclerosis due to lipid rich plaque: Principal | ICD-10-CM

## 2015-02-08 DIAGNOSIS — M129 Arthropathy, unspecified: Secondary | ICD-10-CM | POA: Diagnosis not present

## 2015-02-08 HISTORY — DX: Hypothyroidism, unspecified: E03.9

## 2015-02-08 NOTE — Assessment & Plan Note (Signed)
Patient cleared for arthroscopic surgery of her left knee. Patient started been discussed holding her medicines today of surgery and restarting as she is able to eat.

## 2015-02-08 NOTE — Progress Notes (Signed)
BP 134/73 mmHg  Pulse 80  Temp(Src) 98.1 F (36.7 C)  Ht 5' 1.5" (1.562 m)  Wt 232 lb (105.235 kg)  BMI 43.13 kg/m2  SpO2 99%   Subjective:    Patient ID: Annette Porter, female    DOB: 1950/07/15, 64 y.o.   MRN: [id number]  HPI: Annette Porter is a 64 y.o. female  Chief Complaint  Patient presents with  . surgical clearance   Patient doing well having arthroscopic surgery of her left knee September 16. Patient with no complaints in particular coronary artery disease having no signs or symptoms of angina and shortness of breath PND distal edema.  Reviewed other medications and doing well. No complaints  Relevant past medical, surgical, family and social history reviewed and updated as indicated. Interim medical history since our last visit reviewed. Allergies and medications reviewed and updated.  Review of Systems  Constitutional: Negative.   HENT: Negative.   Eyes: Negative.   Respiratory: Negative.   Cardiovascular: Negative.   Gastrointestinal: Negative.   Endocrine: Negative.   Genitourinary: Negative.   Musculoskeletal: Negative.   Skin: Negative.   Allergic/Immunologic: Negative.   Neurological: Negative.   Hematological: Negative.   Psychiatric/Behavioral: Negative.     Per HPI unless specifically indicated above     Objective:    BP 134/73 mmHg  Pulse 80  Temp(Src) 98.1 F (36.7 C)  Ht 5' 1.5" (1.562 m)  Wt 232 lb (105.235 kg)  BMI 43.13 kg/m2  SpO2 99%  Wt Readings from Last 3 Encounters:  02/08/15 232 lb (105.235 kg)  02/08/15 230 lb (104.327 kg)  12/21/14 232 lb (105.235 kg)    Physical Exam  Constitutional: She is oriented to person, place, and time. She appears well-developed and well-nourished. No distress.  HENT:  Head: Normocephalic and atraumatic.  Right Ear: Hearing normal.  Left Ear: Hearing normal.  Nose: Nose normal.  Eyes: Conjunctivae and lids are normal. Right eye exhibits no discharge. Left eye exhibits no discharge.  No scleral icterus.  Pulmonary/Chest: Effort normal. No respiratory distress.  Musculoskeletal: Normal range of motion.  Patient's distal pulses intact  Neurological: She is alert and oriented to person, place, and time.  Skin: Skin is intact. No rash noted.  Psychiatric: She has a normal mood and affect. Her speech is normal and behavior is normal. Judgment and thought content normal. Cognition and memory are normal.    Results for orders placed or performed in visit on 12/21/14  Microscopic Examination  Result Value Ref Range   WBC, UA 0-5 0 -  5 /hpf   RBC, UA 0-2 0 -  2 /hpf   Epithelial Cells (non renal) 0-10 0 - 10 /hpf   Bacteria, UA Few None seen/Few  CBC with Differential/Platelet  Result Value Ref Range   WBC 7.7 3.4 - 10.8 x10E3/uL   RBC 4.24 3.77 - 5.28 x10E6/uL   Hemoglobin 12.9 11.1 - 15.9 g/dL   Hematocrit 39.0 34.0 - 46.6 %   MCV 92 79 - 97 fL   MCH 30.4 26.6 - 33.0 pg   MCHC 33.1 31.5 - 35.7 g/dL   RDW 14.1 12.3 - 15.4 %   Platelets 249 150 - 379 x10E3/uL   Neutrophils 60 %   Lymphs 28 %   Monocytes 7 %   Eos 4 %   Basos 1 %   Neutrophils Absolute 4.7 1.4 - 7.0 x10E3/uL   Lymphocytes Absolute 2.2 0.7 - 3.1 x10E3/uL   Monocytes Absolute 0.6  0.1 - 0.9 x10E3/uL   EOS (ABSOLUTE) 0.3 0.0 - 0.4 x10E3/uL   Basophils Absolute 0.0 0.0 - 0.2 x10E3/uL   Immature Granulocytes 0 %   Immature Grans (Abs) 0.0 0.0 - 0.1 x10E3/uL  Comprehensive metabolic panel  Result Value Ref Range   Glucose 90 65 - 99 mg/dL   BUN 11 8 - 27 mg/dL   Creatinine, Ser 0.69 0.57 - 1.00 mg/dL   GFR calc non Af Amer 92 >59 mL/min/1.73   GFR calc Af Amer 106 >59 mL/min/1.73   BUN/Creatinine Ratio 16 11 - 26   Sodium 140 134 - 144 mmol/L   Potassium 4.5 3.5 - 5.2 mmol/L   Chloride 100 97 - 108 mmol/L   CO2 21 18 - 29 mmol/L   Calcium 9.4 8.7 - 10.3 mg/dL   Total Protein 6.5 6.0 - 8.5 g/dL   Albumin 4.2 3.6 - 4.8 g/dL   Globulin, Total 2.3 1.5 - 4.5 g/dL   Albumin/Globulin Ratio 1.8 1.1  - 2.5   Bilirubin Total 0.3 0.0 - 1.2 mg/dL   Alkaline Phosphatase 68 39 - 117 IU/L   AST 22 0 - 40 IU/L   ALT 26 0 - 32 IU/L  Lipid panel  Result Value Ref Range   Cholesterol, Total 144 100 - 199 mg/dL   Triglycerides 114 0 - 149 mg/dL   HDL 60 >39 mg/dL   VLDL Cholesterol Cal 23 5 - 40 mg/dL   LDL Calculated 61 0 - 99 mg/dL   Chol/HDL Ratio 2.4 0.0 - 4.4 ratio units  TSH  Result Value Ref Range   TSH 3.520 0.450 - 4.500 uIU/mL  Urinalysis, Routine w reflex microscopic (not at [facility])  Result Value Ref Range   Specific Gravity, UA 1.010 1.005 - 1.030   pH, UA 7.0 5.0 - 7.5   Color, UA Yellow Yellow   Appearance Ur Clear Clear   Leukocytes, UA 1+ (A) Negative   Protein, UA Negative Negative/Trace   Glucose, UA Negative Negative   Ketones, UA Negative Negative   RBC, UA Negative Negative   Bilirubin, UA Negative Negative   Urobilinogen, Ur 0.2 0.2 - 1.0 mg/dL   Nitrite, UA Negative Negative   Microscopic Examination See below:       Assessment & Plan:  Reviewed EKG and normal Problem List Items Addressed This Visit      Cardiovascular and Mediastinum   CAD (coronary artery disease) - Primary    stable        Musculoskeletal and Integument   Arthritis of left knee    Patient cleared for arthroscopic surgery of her left knee. Patient started been discussed holding her medicines today of surgery and restarting as she is able to eat.           Follow up plan: Return if symptoms worsen or fail to improve, for Regular scheduled visit.

## 2015-02-08 NOTE — Patient Instructions (Addendum)
  Your procedure is scheduled on: Friday 02/24/2015 Report to Day Surgery. 2ND FLOOR MEDICAL MALL ENTRANCE To find out your arrival time please call [phone] between 1PM - 3PM on Thursday 02/23/2015.  Remember: Instructions that are not followed completely may result in serious medical risk, up to and including death, or upon the discretion of your surgeon and anesthesiologist your surgery may need to be rescheduled.    __X__ 1. Do not eat food or drink liquids after midnight. No gum chewing or hard candies.     __X__ 2. No Alcohol for 24 hours before or after surgery.   ____ 3. Bring all medications with you on the day of surgery if instructed.    __X__ 4. Notify your doctor if there is any change in your medical condition     (cold, fever, infections).     Do not wear jewelry, make-up, hairpins, clips or nail polish.  Do not wear lotions, powders, or perfumes.   Do not shave 48 hours prior to surgery. Men may shave face and neck.  Do not bring valuables to the hospital.    [facility] is not responsible for any belongings or valuables.               Contacts, dentures or bridgework may not be worn into surgery.  Leave your suitcase in the car. After surgery it may be brought to your room.  For patients admitted to the hospital, discharge time is determined by your                treatment team.   Patients discharged the day of surgery will not be allowed to drive home.   Please read over the following fact sheets that you were given:   Surgical Site Infection Prevention   __X__ Take these medicines the morning of surgery with A SIP OF WATER:    1. ISOSORBIDE  2. LEVOTHYROXINE  3. METOPROLOL  4. RANITIDINE AT BEDTIME AND MORNING OF SURGERY  5.  6.  ____ Fleet Enema (as directed)   __X__ Use CHG Soap as directed  ____ Use inhalers on the day of surgery  ____ Stop metformin 2 days prior to surgery    ____ Take 1/2 of usual insulin dose the night before surgery and  none on the morning of surgery.   __X__ Stop Coumadin/Plavix/aspirin 5 DAYS PRIOR TO SURGERY  __X__ Stop Anti-inflammatories on 7 DAYS PRIOR TO SURGERY (IBUPROFEN)   __X__ Stop supplements until after surgery.  7 DAYS PRIOR TO SURGERY (VITAMIN B 12)  ____ Bring C-Pap to the hospital.

## 2015-02-08 NOTE — Assessment & Plan Note (Signed)
stable °

## 2015-02-15 ENCOUNTER — Other Ambulatory Visit: Payer: Medicare Other

## 2015-02-24 ENCOUNTER — Encounter: Payer: Self-pay | Admitting: *Deleted

## 2015-02-24 ENCOUNTER — Encounter
Admission: RE | Disposition: A | Discharge status: Home / Self Care | Payer: Self-pay | Source: Ambulatory Visit | Attending: Specialist

## 2015-02-24 ENCOUNTER — Ambulatory Visit: Payer: Medicare Other | Admitting: Anesthesiology

## 2015-02-24 ENCOUNTER — Ambulatory Visit
Admission: RE | Admit: 2015-02-24 | Discharge: 2015-02-24 | Disposition: A | Discharge status: Home / Self Care | Payer: Medicare Other | Source: Ambulatory Visit | Attending: Specialist | Admitting: Specialist

## 2015-02-24 DIAGNOSIS — Z885 Allergy status to narcotic agent status: Secondary | ICD-10-CM | POA: Diagnosis not present

## 2015-02-24 DIAGNOSIS — Z8249 Family history of ischemic heart disease and other diseases of the circulatory system: Secondary | ICD-10-CM | POA: Insufficient documentation

## 2015-02-24 DIAGNOSIS — Z833 Family history of diabetes mellitus: Secondary | ICD-10-CM | POA: Insufficient documentation

## 2015-02-24 DIAGNOSIS — I2510 Atherosclerotic heart disease of native coronary artery without angina pectoris: Secondary | ICD-10-CM | POA: Diagnosis not present

## 2015-02-24 DIAGNOSIS — Z809 Family history of malignant neoplasm, unspecified: Secondary | ICD-10-CM | POA: Diagnosis not present

## 2015-02-24 DIAGNOSIS — Z79891 Long term (current) use of opiate analgesic: Secondary | ICD-10-CM | POA: Insufficient documentation

## 2015-02-24 DIAGNOSIS — Z791 Long term (current) use of non-steroidal anti-inflammatories (NSAID): Secondary | ICD-10-CM | POA: Insufficient documentation

## 2015-02-24 DIAGNOSIS — S83232A Complex tear of medial meniscus, current injury, left knee, initial encounter: Secondary | ICD-10-CM | POA: Diagnosis not present

## 2015-02-24 DIAGNOSIS — Z9889 Other specified postprocedural states: Secondary | ICD-10-CM | POA: Diagnosis not present

## 2015-02-24 DIAGNOSIS — I10 Essential (primary) hypertension: Secondary | ICD-10-CM | POA: Diagnosis not present

## 2015-02-24 DIAGNOSIS — Z79899 Other long term (current) drug therapy: Secondary | ICD-10-CM | POA: Insufficient documentation

## 2015-02-24 DIAGNOSIS — Z91048 Other nonmedicinal substance allergy status: Secondary | ICD-10-CM | POA: Insufficient documentation

## 2015-02-24 DIAGNOSIS — X58XXXA Exposure to other specified factors, initial encounter: Secondary | ICD-10-CM | POA: Insufficient documentation

## 2015-02-24 DIAGNOSIS — S83242A Other tear of medial meniscus, current injury, left knee, initial encounter: Secondary | ICD-10-CM | POA: Diagnosis not present

## 2015-02-24 DIAGNOSIS — Z7982 Long term (current) use of aspirin: Secondary | ICD-10-CM | POA: Diagnosis not present

## 2015-02-24 HISTORY — PX: KNEE ARTHROSCOPY WITH MEDIAL MENISECTOMY: SHX5651

## 2015-02-24 SURGERY — ARTHROSCOPY, KNEE, WITH MEDIAL MENISCECTOMY
Anesthesia: General | Site: Knee | Laterality: Left | Wound class: Clean

## 2015-02-24 MED ORDER — FENTANYL CITRATE (PF) 100 MCG/2ML IJ SOLN
INTRAMUSCULAR | Status: DC | PRN
  Administered 2015-02-24 (×5): 50 ug via INTRAVENOUS

## 2015-02-24 MED ORDER — PROPOFOL 10 MG/ML IV BOLUS
INTRAVENOUS | Status: DC | PRN
  Administered 2015-02-24: 150 mg via INTRAVENOUS
  Administered 2015-02-24: 50 mg via INTRAVENOUS

## 2015-02-24 MED ORDER — HYDROCODONE-ACETAMINOPHEN 5-325 MG PO TABS
1.0000 | ORAL_TABLET | Freq: Four times a day (QID) | ORAL | Status: DC | PRN
  Administered 2015-02-24: 1 via ORAL

## 2015-02-24 MED ORDER — FENTANYL CITRATE (PF) 100 MCG/2ML IJ SOLN
25.0000 ug | INTRAMUSCULAR | Status: DC | PRN
  Administered 2015-02-24 (×2): 25 ug via INTRAVENOUS

## 2015-02-24 MED ORDER — ONDANSETRON HCL 4 MG/2ML IJ SOLN
INTRAMUSCULAR | Status: DC | PRN
  Administered 2015-02-24: 4 mg via INTRAVENOUS

## 2015-02-24 MED ORDER — MIDAZOLAM HCL 2 MG/2ML IJ SOLN
INTRAMUSCULAR | Status: DC | PRN
  Administered 2015-02-24: 1 mg via INTRAVENOUS

## 2015-02-24 MED ORDER — MORPHINE SULFATE (PF) 4 MG/ML IV SOLN
INTRAVENOUS | Status: DC | PRN
  Administered 2015-02-24: 4 mg via INTRAMUSCULAR

## 2015-02-24 MED ORDER — LACTATED RINGERS IV SOLN
INTRAVENOUS | Status: DC
  Administered 2015-02-24 (×2): via INTRAVENOUS

## 2015-02-24 MED ORDER — HYDROCODONE-ACETAMINOPHEN 5-325 MG PO TABS: 1.0000 | ORAL_TABLET | Freq: Four times a day (QID) | ORAL | Status: DC | PRN

## 2015-02-24 MED ORDER — HYDROCODONE-ACETAMINOPHEN 5-325 MG PO TABS
ORAL_TABLET | ORAL | Status: AC
  Filled 2015-02-24: qty 1

## 2015-02-24 MED ORDER — BUPIVACAINE-EPINEPHRINE (PF) 0.5% -1:200000 IJ SOLN
INTRAMUSCULAR | Status: AC
  Filled 2015-02-24: qty 30

## 2015-02-24 MED ORDER — FENTANYL CITRATE (PF) 100 MCG/2ML IJ SOLN
INTRAMUSCULAR | Status: AC
  Administered 2015-02-24: 25 ug via INTRAVENOUS
  Filled 2015-02-24: qty 2

## 2015-02-24 MED ORDER — BUPIVACAINE-EPINEPHRINE (PF) 0.5% -1:200000 IJ SOLN
INTRAMUSCULAR | Status: DC | PRN
  Administered 2015-02-24: 30 mL via PERINEURAL

## 2015-02-24 MED ORDER — EPHEDRINE SULFATE 50 MG/ML IJ SOLN
INTRAMUSCULAR | Status: DC | PRN
  Administered 2015-02-24: 10 mg via INTRAVENOUS

## 2015-02-24 MED ORDER — LIDOCAINE HCL (CARDIAC) 20 MG/ML IV SOLN
INTRAVENOUS | Status: DC | PRN
  Administered 2015-02-24: 50 mg via INTRAVENOUS

## 2015-02-24 MED ORDER — DEXAMETHASONE SODIUM PHOSPHATE 4 MG/ML IJ SOLN
INTRAMUSCULAR | Status: DC | PRN
  Administered 2015-02-24: 5 mg via INTRAVENOUS

## 2015-02-24 MED ORDER — ONDANSETRON HCL 4 MG/2ML IJ SOLN: 4.0000 mg | Freq: Once | INTRAMUSCULAR | Status: DC | PRN

## 2015-02-24 MED ORDER — MORPHINE SULFATE (PF) 4 MG/ML IV SOLN
INTRAVENOUS | Status: AC
  Filled 2015-02-24: qty 1

## 2015-02-24 MED ORDER — GLYCOPYRROLATE 0.2 MG/ML IJ SOLN
INTRAMUSCULAR | Status: DC | PRN
  Administered 2015-02-24: 0.2 mg via INTRAVENOUS

## 2015-02-24 SURGICAL SUPPLY — 19 items
BANDAGE ELASTIC 6 CLIP NS LF (GAUZE/BANDAGES/DRESSINGS) ×3 IMPLANT
BLADE AGGRESSIVE PLUS 4.0 (BLADE) ×3 IMPLANT
BUR RADIUS 3.5 (BURR) ×3 IMPLANT
CHLORAPREP W/TINT 26ML (MISCELLANEOUS) ×3 IMPLANT
DECANTER SPIKE VIAL GLASS SM (MISCELLANEOUS) ×3 IMPLANT
GAUZE SPONGE 4X4 12PLY STRL (GAUZE/BANDAGES/DRESSINGS) ×3 IMPLANT
GLOVE BIO SURGEON STRL SZ7.5 (GLOVE) ×6 IMPLANT
GOWN STRL REUS W/ TWL LRG LVL3 (GOWN DISPOSABLE) ×2 IMPLANT
GOWN STRL REUS W/TWL LRG LVL3 (GOWN DISPOSABLE) ×4
IV LACTATED RINGER IRRG 3000ML (IV SOLUTION) ×10
IV LR IRRIG 3000ML ARTHROMATIC (IV SOLUTION) ×5 IMPLANT
MANIFOLD NEPTUNE II (INSTRUMENTS) ×3 IMPLANT
PACK ARTHROSCOPY KNEE (MISCELLANEOUS) ×3 IMPLANT
SET TUBE SUCT SHAVER OUTFL 24K (TUBING) ×3 IMPLANT
SET TUBE TIP INTRA-ARTICULAR (MISCELLANEOUS) ×3 IMPLANT
STRAP SAFETY BODY (MISCELLANEOUS) IMPLANT
SUT ETHILON 5-0 FS-2 18 BLK (SUTURE) ×3 IMPLANT
TUBING ARTHRO INFLOW-ONLY STRL (TUBING) ×3 IMPLANT
WAND HAND CNTRL MULTIVAC 50 (MISCELLANEOUS) ×3 IMPLANT

## 2015-02-24 NOTE — Brief Op Note (Signed)
02/24/2015  9:04 AM  PATIENT:  Annette Porter  64 y.o. female  PRE-OPERATIVE DIAGNOSIS:  CURRENT TEAR OF MEDIAL CARTILAGE AND OR MENISCUS   POST-OPERATIVE DIAGNOSIS:  TEAR OF MEDIAL MENISCUS  PROCEDURE:  Procedure(s): KNEE ARTHROSCOPY WITH PARTIAL MEDIAL MENISECTOMY (Left)  SURGEON:  Surgeon(s) and Role:    * Christophe Louis, MD - Primary  PHYSICIAN ASSISTANT:   ASSISTANTS: none   ANESTHESIA:   general  EBL:  Total I/O In: 500 [I.V.:500] Out: 25 [Blood:25]  BLOOD ADMINISTERED:none  DRAINS: none   LOCAL MEDICATIONS USED:  MARCAINE     SPECIMEN:  No Specimen  DISPOSITION OF SPECIMEN:  N/A  COUNTS:  YES  TOURNIQUET:    DICTATION: .Other Dictation: Dictation Number [id number]  PLAN OF CARE: Discharge to home after PACU  PATIENT DISPOSITION:  PACU - hemodynamically stable.   Delay start of Pharmacological VTE agent (>24hrs) due to surgical blood loss or risk of bleeding: not applicable

## 2015-02-24 NOTE — Anesthesia Postprocedure Evaluation (Signed)
  Anesthesia Post-op Note  Patient: Annette Porter  Procedure(s) Performed: Procedure(s): KNEE ARTHROSCOPY WITH PARTIAL MEDIAL MENISECTOMY (Left)  Anesthesia type:General  Patient location: PACU  Post pain: Pain level controlled  Post assessment: Post-op Vital signs reviewed, Patient's Cardiovascular Status Stable, Respiratory Function Stable, Patent Airway and No signs of Nausea or vomiting  Post vital signs: Reviewed and stable  Last Vitals:  Filed Vitals:   02/24/15 1048  BP: 133/70  Pulse: 76  Temp:   Resp: 18    Level of consciousness: awake, alert  and patient cooperative  Complications: No apparent anesthesia complications

## 2015-02-24 NOTE — Transfer of Care (Signed)
Immediate Anesthesia Transfer of Care Note  Patient: Annette Porter  Procedure(s) Performed: Procedure(s): KNEE ARTHROSCOPY WITH PARTIAL MEDIAL MENISECTOMY (Left)  Patient Location: PACU  Anesthesia Type:General  Level of Consciousness: awake  Airway & Oxygen Therapy: Patient Spontanous Breathing and Patient connected to face mask oxygen  Post-op Assessment: Report given to RN  Post vital signs: Reviewed  Last Vitals:  Filed Vitals:   02/24/15 0853  BP: 115/88  Pulse: 63  Temp: 36.1 C  Resp: 10    Complications: No apparent anesthesia complications

## 2015-02-24 NOTE — Anesthesia Preprocedure Evaluation (Signed)
Anesthesia Evaluation  Patient identified by MRN, date of birth, ID band Patient awake    Reviewed: Allergy & Precautions, NPO status , Patient's Chart, lab work & pertinent test results, reviewed documented beta blocker date and time   Airway Mallampati: III  TM Distance: >3 FB     Dental  (+) Chipped   Pulmonary           Cardiovascular hypertension, Pt. on medications and Pt. on home beta blockers + CAD and + Past MI       Neuro/Psych    GI/Hepatic GERD  Medicated,  Endo/Other  Hypothyroidism   Renal/GU      Musculoskeletal  (+) Arthritis ,   Abdominal   Peds  Hematology   Anesthesia Other Findings   Reproductive/Obstetrics                             Anesthesia Physical Anesthesia Plan  ASA: III  Anesthesia Plan: General   Post-op Pain Management:    Induction: Intravenous  Airway Management Planned: LMA  Additional Equipment:   Intra-op Plan:   Post-operative Plan:   Informed Consent: I have reviewed the patients History and Physical, chart, labs and discussed the procedure including the risks, benefits and alternatives for the proposed anesthesia with the patient or authorized representative who has indicated his/her understanding and acceptance.     Plan Discussed with: CRNA  Anesthesia Plan Comments:         Anesthesia Quick Evaluation

## 2015-02-24 NOTE — Discharge Instructions (Signed)
Partial weight bearing with walker May remove entire bandage tomorrow, bathe, get wet, etc and cover wounds with Band aids Work at all times on range of motion of kneeAMBULATORY SURGERY  DISCHARGE INSTRUCTIONS   1) The drugs that you were given will stay in your system until tomorrow so for the next 24 hours you should not:  A) Drive an automobile B) Make any legal decisions C) Drink any alcoholic beverage   2) You may resume regular meals tomorrow.  Today it is better to start with liquids and gradually work up to solid foods.  You may eat anything you prefer, but it is better to start with liquids, then soup and crackers, and gradually work up to solid foods.   3) Please notify your doctor immediately if you have any unusual bleeding, trouble breathing, redness and pain at the surgery site, drainage, fever, or pain not relieved by medication.    4) Additional Instructions:        Please contact your physician with any problems or Same Day Surgery at [phone], Monday through Friday 6 am to 4 pm, or [place] at [facility] number at [phone].

## 2015-02-24 NOTE — H&P (Signed)
  64 year old female with meniscus tear left knee.  History and physical exam has been inserted into the medical chart in the form of a paper document.  Heart and lungs clear.  ENT normal.  Plan: arthroscopic partial medial menisectomy left knee.

## 2015-02-24 NOTE — Anesthesia Procedure Notes (Signed)
Procedure Name: LMA Insertion Performed by: LOGAN, BENJAMIN Pre-anesthesia Checklist: Patient identified, Patient being monitored, Timeout performed, Emergency Drugs available and Suction available Patient Re-evaluated:Patient Re-evaluated prior to inductionOxygen Delivery Method: Circle system utilized Preoxygenation: Pre-oxygenation with 100% oxygen Intubation Type: IV induction Ventilation: Mask ventilation without difficulty LMA: LMA inserted LMA Size: 4.0 Tube type: Oral Number of attempts: 1 Placement Confirmation: positive ETCO2 and breath sounds checked- equal and bilateral Tube secured with: Tape Dental Injury: Teeth and Oropharynx as per pre-operative assessment      

## 2015-02-27 NOTE — Op Note (Signed)
NAMEAUNIKA, KIRSTEN NO.:  [id number]  MEDICAL RECORD NO.:  [id number]  LOCATION:  [facility]                         FACILITY:  [facility]  PHYSICIAN:  Margaretmary Eddy, MD        DATE OF BIRTH:  1950/06/20  DATE OF PROCEDURE:  02/24/2015 DATE OF DISCHARGE:  02/24/2015                              OPERATIVE REPORT   PREOPERATIVE DIAGNOSIS:  Medial meniscus tear, left knee.  POSTOPERATIVE DIAGNOSIS:  Medial meniscus tear, left knee.  PROCEDURE PERFORMED:  Arthroscopic partial medial meniscectomy, left knee.  SURGEON:  Margaretmary Eddy, MD  SURGEON:  Margaretmary Eddy, MD  ANESTHESIA:  General.  COMPLICATIONS:  None.  ESTIMATED BLOOD LOSS:  25 mL.  DESCRIPTION OF PROCEDURE:  After adequate induction of general anesthesia, the left lower extremity is secured in the leg holder in the usual manner for arthroscopy.  The left knee and leg are thoroughly prepped with alcohol and ChloraPrep and draped in standard sterile fashion.  The joint was infiltrated with 10 mL of Marcaine with epinephrine.  Diagnostic arthroscopy was performed.  There were mild patellofemoral degenerative change and mild synovitis in the suprapatellar pouch.  The lateral compartment is normal.  The intercondylar notch is normal with a moderate amount of synovitis and a normal anterior cruciate ligament.  The pathology is confined to the medial compartment.  In the lateral gutter, there seemed to be a very small hypertrophic spur forming coming off the medial femoral condyle. The main pathology is a large bucket handle type tear of the posterior horn of the medial meniscus.  This is associated with mild chondromalacia of the femoral and tibial articular surfaces.  Using a combination of the basket forceps, the full radial resector, and the ArthroWand, the torn portion of the meniscus was completely resected back to a stable rim.  The probe was used to ensure that complete resection had been obtained. The joint  was thoroughly irrigated multiple times.  Skin edges were closed with 5-0 nylon.  The joint was infiltrated with 20 mL of Marcaine with epinephrine and 4 mg of morphine.  Soft bulky dressing was applied. Patient was returned to the recovery room in satisfactory condition having tolerated the procedure quite well.          ______________________________ Margaretmary Eddy, MD     CS/MEDQ  D:  02/27/2015  T:  02/27/2015  Job:  [id number]

## 2015-03-11 ENCOUNTER — Other Ambulatory Visit: Payer: Self-pay | Admitting: General Surgery

## 2015-03-15 DIAGNOSIS — E119 Type 2 diabetes mellitus without complications: Secondary | ICD-10-CM | POA: Diagnosis not present

## 2015-03-15 DIAGNOSIS — I2510 Atherosclerotic heart disease of native coronary artery without angina pectoris: Secondary | ICD-10-CM | POA: Diagnosis not present

## 2015-03-15 DIAGNOSIS — I25118 Atherosclerotic heart disease of native coronary artery with other forms of angina pectoris: Secondary | ICD-10-CM | POA: Diagnosis not present

## 2015-03-15 DIAGNOSIS — E782 Mixed hyperlipidemia: Secondary | ICD-10-CM | POA: Diagnosis not present

## 2015-03-22 ENCOUNTER — Ambulatory Visit: Payer: Medicare Other

## 2015-03-22 DIAGNOSIS — Z23 Encounter for immunization: Secondary | ICD-10-CM

## 2015-03-24 ENCOUNTER — Encounter: Payer: Self-pay | Admitting: General Surgery

## 2015-03-24 DIAGNOSIS — Z9889 Other specified postprocedural states: Secondary | ICD-10-CM | POA: Diagnosis not present

## 2015-03-24 DIAGNOSIS — D0512 Intraductal carcinoma in situ of left breast: Secondary | ICD-10-CM | POA: Diagnosis not present

## 2015-03-24 DIAGNOSIS — C50312 Malignant neoplasm of lower-inner quadrant of left female breast: Secondary | ICD-10-CM | POA: Diagnosis not present

## 2015-04-03 ENCOUNTER — Ambulatory Visit (INDEPENDENT_AMBULATORY_CARE_PROVIDER_SITE_OTHER): Payer: Medicare Other | Admitting: General Surgery

## 2015-04-03 ENCOUNTER — Encounter: Payer: Self-pay | Admitting: General Surgery

## 2015-04-03 VITALS — BP 128/66 | HR 72 | Resp 14 | Ht 63.0 in | Wt 233.0 lb

## 2015-04-03 DIAGNOSIS — C50412 Malignant neoplasm of upper-outer quadrant of left female breast: Secondary | ICD-10-CM

## 2015-04-03 DIAGNOSIS — C50312 Malignant neoplasm of lower-inner quadrant of left female breast: Secondary | ICD-10-CM | POA: Diagnosis not present

## 2015-04-03 DIAGNOSIS — Z1211 Encounter for screening for malignant neoplasm of colon: Secondary | ICD-10-CM

## 2015-04-03 DIAGNOSIS — I2510 Atherosclerotic heart disease of native coronary artery without angina pectoris: Secondary | ICD-10-CM

## 2015-04-03 MED ORDER — POLYETHYLENE GLYCOL 3350 17 GM/SCOOP PO POWD
ORAL | Status: DC
Start: 2015-04-03 — End: 2015-07-12

## 2015-04-03 NOTE — Progress Notes (Deleted)
Patient ID: MARSHAE AZAM, female   DOB: 07/21/1950, 64 y.o.   MRN: [id number]  Chief Complaint  Patient presents with  . Follow-up    mammogram    HPI MAANSI WIKE is a 64 y.o. female who presents for a breast cancer follow up. The most recent mammogram was done on 03/24/15. Patient does perform regular self breast checks and gets regular mammograms done.      HPI  Past Medical History  Diagnosis Date  . Myocardial infarction ([place]) 07/1998  . GERD (gastroesophageal reflux disease)   . 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 Negative margins on resection. MammoSite  . 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 Oncotype DX recurrent score: 7% with antiestrogen alone. MammoSite.   . Arthritis   . Thyroid disease   . CAD (coronary artery disease)   . Hyperlipidemia   . Hypertension   . Morbid obesity ([place])   . Hypothyroidism     Past Surgical History  Procedure Laterality Date  . Breast mammosite  05/20/13  . Breast biopsy Left 03/22/13     stereo  . Breast surgery Left 04/29/13    wide excision  . Breast biopsy Left 11/11/13    INVASIVE MAMMARY CARCINOMA  . Breast surgery Left 12-16-13    Wide excision with SN biopsy, INVASIVE MAMMARY CARCINOMA  . Colonoscopy  2006  . Hand surgery    . Cardiac catheterization    . Knee arthroscopy with medial menisectomy Left 02/24/2015    Procedure: KNEE ARTHROSCOPY WITH PARTIAL MEDIAL MENISECTOMY;  Surgeon: Christophe Louis, MD;  Location: [facility] ORS;  Service: Orthopedics;  Laterality: Left;    Family History  Problem Relation Age of Onset  . Diabetes Mother     Social History Social History  Substance Use Topics  . Smoking status: Never Smoker   . Smokeless tobacco: Never Used  . Alcohol  Use: Yes     Comment: causal    Allergies  Allergen Reactions  . Dilaudid [Hydromorphone] Nausea And Vomiting  . Tape Other (See Comments)    irratation    Current Outpatient Prescriptions  Medication Sig Dispense Refill  . aspirin 81 MG tablet Take 81 mg by mouth daily.    Marland Kitchen atorvastatin (LIPITOR) 40 MG tablet Take 1 tablet (40 mg total) by mouth daily. 90 tablet 4  . Calcium Carbonate (CALCIUM 600 PO) Take 1 tablet by mouth 2 (two) times daily.    . Cholecalciferol (VITAMIN D3) 1000 UNITS CAPS Take 1 capsule by mouth daily.    Marland Kitchen docusate sodium (COLACE) 100 MG capsule Take 200 mg by mouth daily.     Marland Kitchen ezetimibe (ZETIA) 10 MG tablet Take 1 tablet (10 mg total) by mouth daily. 90 tablet 4  . HYDROcodone-acetaminophen (NORCO) 5-325 MG per tablet Take 1 tablet by mouth every 6 (six) hours as needed for moderate pain. 30 tablet 0  . ibuprofen (ADVIL,MOTRIN) 800 MG tablet Take 1 tablet (800 mg total) by mouth as needed. 270 tablet 1  . isosorbide mononitrate (IMDUR) 30 MG 24 hr tablet Take 30 mg by mouth daily.    . isosorbide mononitrate (IMDUR) 60 MG 24 hr tablet Take 1 tablet by mouth daily.    Marland Kitchen letrozole (FEMARA) 2.5 MG tablet TAKE ONE TABLET BY  MOUTH ONCE DAILY 90 tablet 3  . levothyroxine (SYNTHROID) 125 MCG tablet Take 1 tablet (125 mcg total) by mouth daily. 90 tablet 4  . metoprolol succinate (TOPROL-XL) 50 MG 24 hr tablet Take 1 tablet by mouth daily.    . niacin (NIASPAN) 1000 MG CR tablet Take 1 tablet (1,000 mg total) by mouth daily. 90 tablet 4  . nitroGLYCERIN (NITROSTAT) 0.4 MG SL tablet Place 0.4 mg under the tongue every 5 (five) minutes as needed for chest pain.    . ranitidine (ZANTAC) 150 MG tablet Take 150 mg by mouth at bedtime.     . vitamin B-12 (CYANOCOBALAMIN) 250 MCG tablet Take 500 mcg by mouth daily.      No current facility-administered medications for this visit.    Review of Systems Review of Systems  Height '5\' 3"'  (1.6 m).  Physical Exam Physical  Exam  Data Reviewed ***  Assessment    ***    Plan    ***    PCP: Dennard Nip, Caryl-Lyn M 04/03/2015, 2:19 PM

## 2015-04-03 NOTE — Patient Instructions (Addendum)
The patient has been asked to return to the office in one year with a bilateral diagnostic mammogram   Colonoscopy A colonoscopy is an exam to look at the entire large intestine (colon). This exam can help find problems such as tumors, polyps, inflammation, and areas of bleeding. The exam takes about 1 hour.  LET [facility] CARE PROVIDER KNOW ABOUT:   Any allergies you have.  All medicines you are taking, including vitamins, herbs, eye drops, creams, and over-the-counter medicines.  Previous problems you or members of your family have had with the use of anesthetics.  Any blood disorders you have.  Previous surgeries you have had.  Medical conditions you have. RISKS AND COMPLICATIONS  Generally, this is a safe procedure. However, as with any procedure, complications can occur. Possible complications include:  Bleeding.  Tearing or rupture of the colon wall.  Reaction to medicines given during the exam.  Infection (rare). BEFORE THE PROCEDURE   Ask your health care provider about changing or stopping your regular medicines.  You may be prescribed an oral bowel prep. This involves drinking a large amount of medicated liquid, starting the day before your procedure. The liquid will cause you to have multiple loose stools until your stool is almost clear or light green. This cleans out your colon in preparation for the procedure.  Do not eat or drink anything else once you have started the bowel prep, unless your health care provider tells you it is safe to do so.  Arrange for someone to drive you home after the procedure. PROCEDURE   You will be given medicine to help you relax (sedative).  You will lie on your side with your knees bent.  A long, flexible tube with a light and camera on the end (colonoscope) will be inserted through the rectum and into the colon. The camera sends video back to a computer screen as it moves through the colon. The colonoscope also releases  carbon dioxide gas to inflate the colon. This helps your health care provider see the area better.  During the exam, your health care provider may take a small tissue sample (biopsy) to be examined under a microscope if any abnormalities are found.  The exam is finished when the entire colon has been viewed. AFTER THE PROCEDURE   Do not drive for 24 hours after the exam.  You may have a small amount of blood in your stool.  You may pass moderate amounts of gas and have mild abdominal cramping or bloating. This is caused by the gas used to inflate your colon during the exam.  Ask when your test results will be ready and how you will get your results. Make sure you get your test results.   This information is not intended to replace advice given to you by your health care provider. Make sure you discuss any questions you have with your health care provider.   Document Released: 05/24/2000 Document Revised: 03/17/2013 Document Reviewed: 02/01/2013 Elsevier Interactive Patient Education [employer].  Patient has been scheduled for a colonoscopy on 05-31-15 at [facility].

## 2015-04-03 NOTE — Progress Notes (Signed)
Patient ID: Annette Porter, female   DOB: 07-10-1950, 64 y.o.   MRN: [id number]  Chief Complaint  Patient presents with  . Follow-up    mammogram    HPI Annette Porter is a 64 y.o. female who presents for a Breast cancer follow up and breast evaluation. The most recent mammogram was done on 03/24/15. Patient does perform regular self breast checks and gets regular mammograms done. She reports no new breast issues. She had arthroscopic surgery in her left knee in September.   HPI  Past Medical History  Diagnosis Date  . Myocardial infarction ([place]) 07/1998  . GERD (gastroesophageal reflux disease)   . Arthritis   . Thyroid disease   . CAD (coronary artery disease)   . Hyperlipidemia   . Hypertension   . Morbid obesity ([place])   . Hypothyroidism   . 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 ER/PR negative. Negative margins on resection. MammoSite  . 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 Oncotype DX recurrent score: 7% with antiestrogen alone. MammoSite.     Past Surgical History  Procedure Laterality Date  . Breast mammosite  05/20/13  . Breast biopsy Left 03/22/13     stereo  . Breast surgery Left 04/29/13    wide excision  . Breast biopsy Left 11/11/13    INVASIVE MAMMARY CARCINOMA  . Breast surgery Left 12-16-13    Wide excision with SN biopsy, INVASIVE MAMMARY CARCINOMA  . Colonoscopy  2006  . Hand surgery    . Cardiac catheterization    . Knee arthroscopy with medial menisectomy Left 02/24/2015    Procedure: KNEE ARTHROSCOPY WITH PARTIAL MEDIAL MENISECTOMY;  Surgeon: Christophe Louis, MD;  Location: [facility] ORS;  Service: Orthopedics;  Laterality: Left;    Family History  Problem Relation Age of Onset  . Diabetes Mother      Social History Social History  Substance Use Topics  . Smoking status: Never Smoker   . Smokeless tobacco: Never Used  . Alcohol Use: Yes     Comment: causal    Allergies  Allergen Reactions  . Dilaudid [Hydromorphone] Nausea And Vomiting  . Tape Other (See Comments)    irratation    Current Outpatient Prescriptions  Medication Sig Dispense Refill  . aspirin 81 MG tablet Take 81 mg by mouth daily.    Marland Kitchen atorvastatin (LIPITOR) 40 MG tablet Take 1 tablet (40 mg total) by mouth daily. 90 tablet 4  . Calcium Carbonate (CALCIUM 600 PO) Take 1 tablet by mouth 2 (two) times daily.    . Cholecalciferol (VITAMIN D3) 1000 UNITS CAPS Take 1 capsule by mouth daily.    Marland Kitchen docusate sodium (COLACE) 100 MG capsule Take 200 mg by mouth daily.     Marland Kitchen ezetimibe (ZETIA) 10 MG tablet Take 1 tablet (10 mg total) by mouth daily. 90 tablet 4  . HYDROcodone-acetaminophen (NORCO) 5-325 MG per tablet Take 1 tablet by mouth every 6 (six) hours as needed for moderate pain. 30 tablet 0  . ibuprofen (ADVIL,MOTRIN) 800 MG tablet Take 1 tablet (800 mg total) by mouth as needed. 270 tablet 1  . isosorbide mononitrate (IMDUR) 30 MG 24 hr tablet Take 30 mg by mouth daily.    . isosorbide mononitrate (IMDUR) 60 MG 24 hr  tablet Take 1 tablet by mouth daily.    Marland Kitchen letrozole (FEMARA) 2.5 MG tablet TAKE ONE TABLET BY MOUTH ONCE DAILY 90 tablet 3  . levothyroxine (SYNTHROID) 125 MCG tablet Take 1 tablet (125 mcg total) by mouth daily. 90 tablet 4  . metoprolol succinate (TOPROL-XL) 50 MG 24 hr tablet Take 1 tablet by mouth daily.    . niacin (NIASPAN) 1000 MG CR tablet Take 1 tablet (1,000 mg total) by mouth daily. 90 tablet 4  . nitroGLYCERIN (NITROSTAT) 0.4 MG SL tablet Place 0.4 mg under the tongue every 5 (five) minutes as needed for chest pain.    . ranitidine (ZANTAC) 150 MG tablet Take 150 mg by mouth at bedtime.     . vitamin B-12 (CYANOCOBALAMIN) 250 MCG tablet Take 500 mcg by mouth daily.     . polyethylene  glycol powder (GLYCOLAX/MIRALAX) powder 255 grams one bottle for colonoscopy prep 255 g 0   No current facility-administered medications for this visit.    Review of Systems Review of Systems  Constitutional: Negative.   Respiratory: Negative.   Cardiovascular: Negative.     Blood pressure 128/66, pulse 72, resp. rate 14, height '5\' 3"'  (1.6 m), weight 233 lb (105.688 kg).  Physical Exam Physical Exam  Constitutional: She is oriented to person, place, and time. She appears well-developed and well-nourished.  Eyes: Conjunctivae are normal. No scleral icterus.  Neck: Neck supple.  Cardiovascular: Normal rate, regular rhythm and normal heart sounds.   Pulmonary/Chest: Effort normal and breath sounds normal. Right breast exhibits no inverted nipple, no mass, no nipple discharge, no skin change and no tenderness. Left breast exhibits no inverted nipple, no mass, no nipple discharge, no skin change and no tenderness.    Well healed incision left 2 o'clock with stable scaring  Lymphadenopathy:    She has no cervical adenopathy.    She has no axillary adenopathy.  Neurological: She is alert and oriented to person, place, and time.  Skin: Skin is warm and dry.    Data Reviewed Bilateral diagnostic mammograms dated 03/24/2015 completed at [facility] were reviewed. Postsurgical changes. BI-RADS-2.  Colonoscopy from 09/01/2014 showed mild nonspecific inflammatory changes in the descending colon at 60 cm on biopsy.  Assessment    Doing well status post management of 2 separate foci, metachronous, of breast cancer on the left.  Candidate for screening colonoscopy.    Plan    Colonoscopy with possible biopsy/polypectomy prn: Information regarding the procedure, including its potential risks and complications (including but not limited to perforation of the bowel, which may require emergency surgery to repair, and bleeding) was verbally given to the patient. Educational information  regarding lower intestinal endoscopy was given to the patient. Written instructions for how to complete the bowel prep using Miralax were provided. The importance of drinking ample fluids to avoid dehydration as a result of the prep emphasized.     The patient has been asked to return to the office in six months with a bilateral diagnostic mammogram. This will put her back on a yearly schedule to correspond with the first malignancy excision.  Patient has been scheduled for a colonoscopy on 05-31-15 at [facility]. It is okay for patient to continue 81 mg aspirin once daily.   PCP:  Kennieth Francois 04/04/2015, 12:04 PM

## 2015-04-04 ENCOUNTER — Encounter: Payer: Self-pay | Admitting: General Surgery

## 2015-04-04 DIAGNOSIS — Z1211 Encounter for screening for malignant neoplasm of colon: Secondary | ICD-10-CM | POA: Insufficient documentation

## 2015-04-24 ENCOUNTER — Telehealth: Payer: Self-pay | Admitting: Family Medicine

## 2015-04-24 DIAGNOSIS — I2583 Coronary atherosclerosis due to lipid rich plaque: Secondary | ICD-10-CM

## 2015-04-24 DIAGNOSIS — I2510 Atherosclerotic heart disease of native coronary artery without angina pectoris: Secondary | ICD-10-CM

## 2015-04-24 DIAGNOSIS — E785 Hyperlipidemia, unspecified: Secondary | ICD-10-CM

## 2015-04-24 MED ORDER — NIACIN (ANTIHYPERLIPIDEMIC) 500 MG PO TABS: 1000.0000 mg | ORAL_TABLET | Freq: Every day | ORAL | Status: DC

## 2015-04-24 NOTE — Telephone Encounter (Signed)
Spoke with patient about Niacin no longer being covered by her insurance They gave her three alternatives  Niacor Atorvastatin Calcium (patient currently taking) Fenofibrate Tab 54mg   She uses [facility]

## 2015-04-24 NOTE — Telephone Encounter (Signed)
pts niacin is no longer covered under her insurance. And she would like what to do about getting another rx

## 2015-05-24 ENCOUNTER — Telehealth: Payer: Self-pay | Admitting: *Deleted

## 2015-05-24 NOTE — Telephone Encounter (Signed)
Patient confirms no medication changes since last office visit. Also, states she has Miralax prescription.   We will proceed with colonoscopy as scheduled for 05-31-15 at [facility].   Patient instructed to call the office if she has further questions.

## 2015-05-31 ENCOUNTER — Encounter: Payer: Self-pay | Admitting: Anesthesiology

## 2015-05-31 ENCOUNTER — Ambulatory Visit: Payer: Medicare Other | Admitting: Anesthesiology

## 2015-05-31 ENCOUNTER — Ambulatory Visit
Admission: RE | Admit: 2015-05-31 | Discharge: 2015-05-31 | Disposition: A | Discharge status: Home / Self Care | Payer: Medicare Other | Source: Ambulatory Visit | Attending: General Surgery | Admitting: General Surgery

## 2015-05-31 ENCOUNTER — Encounter
Admission: RE | Disposition: A | Discharge status: Home / Self Care | Payer: Self-pay | Source: Ambulatory Visit | Attending: General Surgery

## 2015-05-31 DIAGNOSIS — M1990 Unspecified osteoarthritis, unspecified site: Secondary | ICD-10-CM | POA: Diagnosis not present

## 2015-05-31 DIAGNOSIS — I252 Old myocardial infarction: Secondary | ICD-10-CM | POA: Insufficient documentation

## 2015-05-31 DIAGNOSIS — Z1211 Encounter for screening for malignant neoplasm of colon: Secondary | ICD-10-CM | POA: Insufficient documentation

## 2015-05-31 DIAGNOSIS — I10 Essential (primary) hypertension: Secondary | ICD-10-CM | POA: Diagnosis not present

## 2015-05-31 DIAGNOSIS — Z853 Personal history of malignant neoplasm of breast: Secondary | ICD-10-CM | POA: Diagnosis not present

## 2015-05-31 DIAGNOSIS — K219 Gastro-esophageal reflux disease without esophagitis: Secondary | ICD-10-CM | POA: Insufficient documentation

## 2015-05-31 DIAGNOSIS — E039 Hypothyroidism, unspecified: Secondary | ICD-10-CM | POA: Diagnosis not present

## 2015-05-31 DIAGNOSIS — E785 Hyperlipidemia, unspecified: Secondary | ICD-10-CM | POA: Diagnosis not present

## 2015-05-31 DIAGNOSIS — Z7982 Long term (current) use of aspirin: Secondary | ICD-10-CM | POA: Insufficient documentation

## 2015-05-31 DIAGNOSIS — I2510 Atherosclerotic heart disease of native coronary artery without angina pectoris: Secondary | ICD-10-CM | POA: Insufficient documentation

## 2015-05-31 DIAGNOSIS — Z79899 Other long term (current) drug therapy: Secondary | ICD-10-CM | POA: Insufficient documentation

## 2015-05-31 HISTORY — PX: COLONOSCOPY WITH PROPOFOL: SHX5780

## 2015-05-31 SURGERY — COLONOSCOPY WITH PROPOFOL
Anesthesia: General

## 2015-05-31 MED ORDER — SODIUM CHLORIDE 0.9 % IV SOLN
INTRAVENOUS | Status: DC
  Administered 2015-05-31: 1000 mL via INTRAVENOUS
  Administered 2015-05-31: 10:00:00 via INTRAVENOUS

## 2015-05-31 MED ORDER — PROPOFOL 500 MG/50ML IV EMUL
INTRAVENOUS | Status: DC | PRN
  Administered 2015-05-31: 100 ug/kg/min via INTRAVENOUS

## 2015-05-31 MED ORDER — MIDAZOLAM HCL 2 MG/2ML IJ SOLN
INTRAMUSCULAR | Status: DC | PRN
  Administered 2015-05-31: 1 mg via INTRAVENOUS

## 2015-05-31 MED ORDER — FENTANYL CITRATE (PF) 100 MCG/2ML IJ SOLN
INTRAMUSCULAR | Status: DC | PRN
  Administered 2015-05-31: 50 ug via INTRAVENOUS

## 2015-05-31 NOTE — Anesthesia Postprocedure Evaluation (Signed)
Anesthesia Post Note  Patient: Annette Porter  Procedure(s) Performed: Procedure(s) (LRB): COLONOSCOPY WITH PROPOFOL (N/A)  Patient location during evaluation: Endoscopy Anesthesia Type: General Level of consciousness: awake and alert Pain management: pain level controlled Vital Signs Assessment: post-procedure vital signs reviewed and stable Respiratory status: spontaneous breathing, nonlabored ventilation, respiratory function stable and patient connected to nasal cannula oxygen Cardiovascular status: blood pressure returned to baseline and stable Postop Assessment: no signs of nausea or vomiting Anesthetic complications: no    Last Vitals:  Filed Vitals:   05/31/15 1047 05/31/15 1057  BP: 135/78 151/77  Pulse: 72 73  Temp:    Resp: 17 17    Last Pain: There were no vitals filed for this visit.               Precious Haws [name]

## 2015-05-31 NOTE — Op Note (Signed)
[facility] Patient Name: Annette Porter Procedure Date: 05/31/2015 9:45 AM MRN: [id number] Account #: [id number] Date of Birth: 06/10/1951 Admit Type: Outpatient Age: 64 Room: [facility] ENDO ROOM 1 Gender: Female Note Status: Finalized Procedure:         Colonoscopy Indications:       Screening for colorectal malignant neoplasm Providers:         Robert Bellow, MD Referring MD:      Guadalupe Maple, MD (Referring MD) Medicines:         Monitored Anesthesia Care Complications:     No immediate complications. Procedure:         Pre-Anesthesia Assessment:                    - Prior to the procedure, a History and Physical was                     performed, and patient medications, allergies and                     sensitivities were reviewed. The patient's tolerance of                     previous anesthesia was reviewed.                    - The risks and benefits of the procedure and the sedation                     options and risks were discussed with the patient. All                     questions were answered and informed consent was obtained.                    After obtaining informed consent, the colonoscope was                     passed under direct vision. Throughout the procedure, the                     patient's blood pressure, pulse, and oxygen saturations                     were monitored continuously. The Colonoscope was                     introduced through the anus and advanced to the the cecum,                     identified by the ileocecal valve. The appendiceal orifice                     was only transiently visualized. The colonoscopy was                     technically difficult and complex due to significant                     looping and a tortuous colon. Successful completion of the                     procedure was aided by using manual pressure and  withdrawing and reinserting the scope. The  patient                     tolerated the procedure well. Findings:      The entire examined colon appeared normal on direct and retroflexion       views. Impression:        - The entire examined colon is normal on direct and                     retroflexion views.                    - No specimens collected. Recommendation:    - Repeat colonoscopy in 10 years for screening purposes. Procedure Code(s): --- Professional ---                    [phone], Colonoscopy, flexible; diagnostic, including                     collection of specimen(s) by brushing or washing, when                     performed (separate procedure) Diagnosis Code(s): --- Professional ---                    Z12.11, Encounter for screening for malignant neoplasm of                     colon CPT copyright 2014 American Medical Association. All rights reserved. The codes documented in this report are preliminary and upon coder review may  be revised to meet current compliance requirements. Robert Bellow, MD 05/31/2015 10:25:00 AM This report has been signed electronically. Number of Addenda: 0 Note Initiated On: 05/31/2015 9:45 AM Scope Withdrawal Time: 0 hours 8 minutes 32 seconds  Total Procedure Duration: 0 hours 32 minutes 35 seconds       [facility]

## 2015-05-31 NOTE — Transfer of Care (Signed)
Immediate Anesthesia Transfer of Care Note  Patient: Annette Porter  Procedure(s) Performed: Procedure(s): COLONOSCOPY WITH PROPOFOL (N/A)  Patient Location: PACU  Anesthesia Type:General  Level of Consciousness: awake and alert   Airway & Oxygen Therapy: Patient Spontanous Breathing  Post-op Assessment: Report given to RN  Post vital signs: stable  Last Vitals:  Filed Vitals:   05/31/15 0819 05/31/15 1026  BP: 161/74 144/75  Pulse: 67 83  Temp: 36.4 C 36.7 C  Resp: 16     Complications: No apparent anesthesia complications

## 2015-05-31 NOTE — Anesthesia Preprocedure Evaluation (Signed)
Anesthesia Evaluation  Patient identified by MRN, date of birth, ID band Patient awake    Reviewed: Allergy & Precautions, H&P , NPO status , Patient's Chart, lab work & pertinent test results  History of Anesthesia Complications Negative for: history of anesthetic complications  Airway Mallampati: III  TM Distance: >3 FB Neck ROM: limited    Dental no notable dental hx. (+) Teeth Intact   Pulmonary neg pulmonary ROS, neg shortness of breath,    Pulmonary exam normal breath sounds clear to auscultation       Cardiovascular Exercise Tolerance: Good hypertension, (-) angina+ CAD and + Past MI  (-) DOE Normal cardiovascular exam Rhythm:regular Rate:Normal     Neuro/Psych negative neurological ROS  negative psych ROS   GI/Hepatic Neg liver ROS, GERD  Controlled,  Endo/Other  Hypothyroidism   Renal/GU negative Renal ROS  negative genitourinary   Musculoskeletal   Abdominal   Peds  Hematology negative hematology ROS (+)   Anesthesia Other Findings Past Medical History:   Myocardial infarction ([place])                     07/1998      GERD (gastroesophageal reflux disease)                       Arthritis                                                    Thyroid disease                                              CAD (coronary artery disease)                                Hyperlipidemia                                               Hypertension                                                 Morbid obesity ([facility])                                         Hypothyroidism                                               Malignant neoplasm of upper-outer quadrant of * November *     Comment:Left, 3:00 DCIS, nuclear grade 3 with comedone               necrosis, 24 mm diameter. ER/PR negative.  Negative margins on resection. MammoSite   Malignant neoplasm of lower-inner quadrant of * 12/16/2013     Comment:Left,  8:00 Invasive mammary carcinoma, pT1c,               N0; ER: 90%; PR 90%; HER-2/neu nonamplified.               Oncotype DX recurrent score: 7% with               antiestrogen alone. MammoSite.   Past Surgical History:   BREAST MAMMOSITE                                 05/20/13     BREAST BIOPSY                                   Left 03/22/13       Comment:stereo   BREAST SURGERY                                  Left 04/29/13       Comment:wide excision   BREAST BIOPSY                                   Left 11/11/13         Comment:INVASIVE MAMMARY CARCINOMA   BREAST SURGERY                                  Left 12-16-13         Comment:Wide excision with SN biopsy, INVASIVE MAMMARY               CARCINOMA   COLONOSCOPY                                      2006         HAND SURGERY                                                  CARDIAC CATHETERIZATION                                       KNEE ARTHROSCOPY WITH MEDIAL MENISECTOMY        Left 02/24/2015      Comment:Procedure: KNEE ARTHROSCOPY WITH PARTIAL MEDIAL              MENISECTOMY;  Surgeon: Christophe Louis, MD;                Location: [facility] ORS;  Service: Orthopedics;                Laterality: Left;  BMI    Body Mass Index   42.05 kg/m 2      Reproductive/Obstetrics negative OB ROS  Anesthesia Physical Anesthesia Plan  ASA: III  Anesthesia Plan: General   Post-op Pain Management:    Induction:   Airway Management Planned:   Additional Equipment:   Intra-op Plan:   Post-operative Plan:   Informed Consent: I have reviewed the patients History and Physical, chart, labs and discussed the procedure including the risks, benefits and alternatives for the proposed anesthesia with the patient or authorized representative who has indicated his/her understanding and acceptance.   Dental Advisory Given  Plan Discussed with: Anesthesiologist, CRNA and  Surgeon  Anesthesia Plan Comments:         Anesthesia Quick Evaluation

## 2015-05-31 NOTE — H&P (Signed)
Annette Porter [id number] 03-May-1951     HPI: Candidate for screening colonoscopy.  Prescriptions prior to admission  Medication Sig Dispense Refill Last Dose  . aspirin 81 MG tablet Take 81 mg by mouth daily.   05/31/2015 at 0600  . atorvastatin (LIPITOR) 40 MG tablet Take 1 tablet (40 mg total) by mouth daily. 90 tablet 4 05/30/2015 at Unknown time  . Calcium Carbonate (CALCIUM 600 PO) Take 1 tablet by mouth 2 (two) times daily.   05/30/2015 at Unknown time  . Cholecalciferol (VITAMIN D3) 1000 UNITS CAPS Take 1 capsule by mouth daily.   05/30/2015 at Unknown time  . docusate sodium (COLACE) 100 MG capsule Take 200 mg by mouth daily.    05/30/2015 at Unknown time  . ezetimibe (ZETIA) 10 MG tablet Take 1 tablet (10 mg total) by mouth daily. 90 tablet 4 05/30/2015 at Unknown time  . HYDROcodone-acetaminophen (NORCO) 5-325 MG per tablet Take 1 tablet by mouth every 6 (six) hours as needed for moderate pain. 30 tablet 0 05/30/2015 at Unknown time  . ibuprofen (ADVIL,MOTRIN) 800 MG tablet Take 1 tablet (800 mg total) by mouth as needed. 270 tablet 1 05/30/2015 at Unknown time  . isosorbide mononitrate (IMDUR) 30 MG 24 hr tablet Take 30 mg by mouth daily.   05/30/2015 at Unknown time  . isosorbide mononitrate (IMDUR) 60 MG 24 hr tablet Take 1 tablet by mouth daily.   05/30/2015 at 0600  . letrozole (FEMARA) 2.5 MG tablet TAKE ONE TABLET BY MOUTH ONCE DAILY 90 tablet 3 05/30/2015 at Unknown time  . levothyroxine (SYNTHROID) 125 MCG tablet Take 1 tablet (125 mcg total) by mouth daily. 90 tablet 4 05/31/2015 at 0600  . metoprolol succinate (TOPROL-XL) 50 MG 24 hr tablet Take 1 tablet by mouth daily.   05/31/2015 at 0600  . Niacin, Antihyperlipidemic, (NIACOR) 500 MG TABS Take 1,000 mg by mouth at bedtime. 180 tablet 4 05/30/2015 at Unknown time  . nitroGLYCERIN (NITROSTAT) 0.4 MG SL tablet Place 0.4 mg under the tongue every 5 (five) minutes as needed for chest pain.   05/30/2015 at Unknown time  .  polyethylene glycol powder (GLYCOLAX/MIRALAX) powder 255 grams one bottle for colonoscopy prep 255 g 0 05/30/2015 at Unknown time  . ranitidine (ZANTAC) 150 MG tablet Take 150 mg by mouth at bedtime.    05/30/2015 at Unknown time  . vitamin B-12 (CYANOCOBALAMIN) 250 MCG tablet Take 500 mcg by mouth daily.    05/30/2015 at Unknown time   Allergies  Allergen Reactions  . Dilaudid [Hydromorphone] Nausea And Vomiting  . Tape Other (See Comments)    irratation   Past Medical History  Diagnosis Date  . Myocardial infarction ([place]) 07/1998  . GERD (gastroesophageal reflux disease)   . Arthritis   . Thyroid disease   . CAD (coronary artery disease)   . Hyperlipidemia   . Hypertension   . Morbid obesity ([place])   . Hypothyroidism   . 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 ER/PR negative. Negative margins on resection. MammoSite  . 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 Oncotype DX recurrent score: 7% with antiestrogen alone. MammoSite.    Past Surgical History  Procedure Laterality Date  . Breast mammosite  05/20/13  . Breast biopsy Left 03/22/13  stereo  . Breast surgery Left 04/29/13    wide excision  . Breast biopsy Left 11/11/13    INVASIVE MAMMARY CARCINOMA  . Breast surgery Left 12-16-13    Wide excision with SN biopsy, INVASIVE MAMMARY CARCINOMA  . Colonoscopy  2006  . Hand surgery    . Cardiac catheterization    . Knee arthroscopy with medial menisectomy Left 02/24/2015    Procedure: KNEE ARTHROSCOPY WITH PARTIAL MEDIAL MENISECTOMY;  Surgeon: Christophe Louis, MD;  Location: [facility] ORS;  Service: Orthopedics;  Laterality: Left;   Social History   Social History  . Marital Status: Married    Spouse Name: N/A  . Number of  Children: N/A  . Years of Education: N/A   Occupational History  . Not on file.   Social History Main Topics  . Smoking status: Never Smoker   . Smokeless tobacco: Never Used  . Alcohol Use: Yes     Comment: causal  . Drug Use: No  . Sexual Activity: Not on file   Other Topics Concern  . Not on file   Social History Narrative   Social History   Social History Narrative     ROS: Negative.     PE: HEENT: Negative. Lungs: Clear. Cardio: RR. Robert Bellow 05/31/2015   Assessment/Plan:  Proceed with planned endoscopy.

## 2015-06-01 ENCOUNTER — Encounter: Payer: Self-pay | Admitting: General Surgery

## 2015-06-28 ENCOUNTER — Ambulatory Visit: Payer: Medicare Other | Admitting: Family Medicine

## 2015-07-12 ENCOUNTER — Ambulatory Visit (INDEPENDENT_AMBULATORY_CARE_PROVIDER_SITE_OTHER): Payer: Medicare Other | Admitting: Family Medicine

## 2015-07-12 ENCOUNTER — Encounter: Payer: Self-pay | Admitting: Family Medicine

## 2015-07-12 VITALS — BP 124/77 | HR 54 | Temp 98.0°F | Ht 61.3 in | Wt 227.0 lb

## 2015-07-12 DIAGNOSIS — I2510 Atherosclerotic heart disease of native coronary artery without angina pectoris: Secondary | ICD-10-CM

## 2015-07-12 DIAGNOSIS — C50911 Malignant neoplasm of unspecified site of right female breast: Secondary | ICD-10-CM | POA: Diagnosis not present

## 2015-07-12 DIAGNOSIS — E039 Hypothyroidism, unspecified: Secondary | ICD-10-CM

## 2015-07-12 DIAGNOSIS — E785 Hyperlipidemia, unspecified: Secondary | ICD-10-CM | POA: Diagnosis not present

## 2015-07-12 DIAGNOSIS — I213 ST elevation (STEMI) myocardial infarction of unspecified site: Secondary | ICD-10-CM

## 2015-07-12 DIAGNOSIS — I10 Essential (primary) hypertension: Secondary | ICD-10-CM

## 2015-07-12 DIAGNOSIS — Z170 Estrogen receptor positive status [ER+]: Secondary | ICD-10-CM

## 2015-07-12 DIAGNOSIS — I2583 Coronary atherosclerosis due to lipid rich plaque: Secondary | ICD-10-CM

## 2015-07-12 LAB — LP+ALT+AST PICCOLO, WAIVED
ALT (SGPT) PICCOLO, WAIVED: 28 U/L (ref 10–47)
AST (SGOT) Piccolo, Waived: 27 U/L (ref 11–38)
Chol/HDL Ratio Piccolo,Waive: 2.5 mg/dL
Cholesterol Piccolo, Waived: 135 mg/dL (ref <200)
HDL Chol Piccolo, Waived: 55 mg/dL — ABNORMAL LOW (ref >59)
LDL Chol Calc Piccolo Waived: 61 mg/dL (ref <100)
Triglycerides Piccolo,Waived: 94 mg/dL (ref <150)
VLDL CHOL CALC PICCOLO,WAIVE: 19 mg/dL (ref <30)

## 2015-07-12 MED ORDER — EZETIMIBE 10 MG PO TABS: 10.0000 mg | ORAL_TABLET | Freq: Every day | ORAL | Status: DC

## 2015-07-12 NOTE — Assessment & Plan Note (Signed)
Stable with meds 

## 2015-07-12 NOTE — Progress Notes (Signed)
BP 124/77 mmHg  Pulse 54  Temp(Src) 98 F (36.7 C)  Ht 5' 1.3" (1.557 m)  Wt 227 lb (102.967 kg)  BMI 42.47 kg/m2  SpO2 98%   Subjective:    Patient ID: Annette Porter, female    DOB: 14-Feb-1951, 65 y.o.   MRN: [id number]  HPI: Annette Porter is a 65 y.o. female  Chief Complaint  Patient presents with  . Hypertension  . Hyperlipidemia   patient follow-up medical problems doing well. Takes medications without side effects and faithfully having some issues with insurance and Zetia we'll represcribed make sure generic name is listed No chest pain chest tightness takes medications without problems is seeing cardiology on a regular basis. Breast cancer follow-up also being done taking medications without problems Blood pressure well controlled without side effects or symptoms  Relevant past medical, surgical, family and social history reviewed and updated as indicated. Interim medical history since our last visit reviewed. Allergies and medications reviewed and updated.  Review of Systems  Constitutional: Negative.   Respiratory: Negative.   Cardiovascular: Negative.     Per HPI unless specifically indicated above     Objective:    BP 124/77 mmHg  Pulse 54  Temp(Src) 98 F (36.7 C)  Ht 5' 1.3" (1.557 m)  Wt 227 lb (102.967 kg)  BMI 42.47 kg/m2  SpO2 98%  Wt Readings from Last 3 Encounters:  07/12/15 227 lb (102.967 kg)  05/31/15 230 lb (104.327 kg)  04/03/15 233 lb (105.688 kg)    Physical Exam  Constitutional: She is oriented to person, place, and time. She appears well-developed and well-nourished. No distress.  HENT:  Head: Normocephalic and atraumatic.  Right Ear: Hearing normal.  Left Ear: Hearing normal.  Nose: Nose normal.  Eyes: Conjunctivae and lids are normal. Right eye exhibits no discharge. Left eye exhibits no discharge. No scleral icterus.  Cardiovascular: Normal rate, regular rhythm and normal heart sounds.   Pulmonary/Chest: Effort normal  and breath sounds normal. No respiratory distress.  Musculoskeletal: Normal range of motion.  Neurological: She is alert and oriented to person, place, and time.  Skin: Skin is intact. No rash noted.  Psychiatric: She has a normal mood and affect. Her speech is normal and behavior is normal. Judgment and thought content normal. Cognition and memory are normal.    Results for orders placed or performed in visit on 12/21/14  Microscopic Examination  Result Value Ref Range   WBC, UA 0-5 0 -  5 /hpf   RBC, UA 0-2 0 -  2 /hpf   Epithelial Cells (non renal) 0-10 0 - 10 /hpf   Bacteria, UA Few None seen/Few  CBC with Differential/Platelet  Result Value Ref Range   WBC 7.7 3.4 - 10.8 x10E3/uL   RBC 4.24 3.77 - 5.28 x10E6/uL   Hemoglobin 12.9 11.1 - 15.9 g/dL   Hematocrit 39.0 34.0 - 46.6 %   MCV 92 79 - 97 fL   MCH 30.4 26.6 - 33.0 pg   MCHC 33.1 31.5 - 35.7 g/dL   RDW 14.1 12.3 - 15.4 %   Platelets 249 150 - 379 x10E3/uL   Neutrophils 60 %   Lymphs 28 %   Monocytes 7 %   Eos 4 %   Basos 1 %   Neutrophils Absolute 4.7 1.4 - 7.0 x10E3/uL   Lymphocytes Absolute 2.2 0.7 - 3.1 x10E3/uL   Monocytes Absolute 0.6 0.1 - 0.9 x10E3/uL   EOS (ABSOLUTE) 0.3 0.0 - 0.4 x10E3/uL  Basophils Absolute 0.0 0.0 - 0.2 x10E3/uL   Immature Granulocytes 0 %   Immature Grans (Abs) 0.0 0.0 - 0.1 x10E3/uL  Comprehensive metabolic panel  Result Value Ref Range   Glucose 90 65 - 99 mg/dL   BUN 11 8 - 27 mg/dL   Creatinine, Ser 0.69 0.57 - 1.00 mg/dL   GFR calc non Af Amer 92 >59 mL/min/1.73   GFR calc Af Amer 106 >59 mL/min/1.73   BUN/Creatinine Ratio 16 11 - 26   Sodium 140 134 - 144 mmol/L   Potassium 4.5 3.5 - 5.2 mmol/L   Chloride 100 97 - 108 mmol/L   CO2 21 18 - 29 mmol/L   Calcium 9.4 8.7 - 10.3 mg/dL   Total Protein 6.5 6.0 - 8.5 g/dL   Albumin 4.2 3.6 - 4.8 g/dL   Globulin, Total 2.3 1.5 - 4.5 g/dL   Albumin/Globulin Ratio 1.8 1.1 - 2.5   Bilirubin Total 0.3 0.0 - 1.2 mg/dL   Alkaline  Phosphatase 68 39 - 117 IU/L   AST 22 0 - 40 IU/L   ALT 26 0 - 32 IU/L  Lipid panel  Result Value Ref Range   Cholesterol, Total 144 100 - 199 mg/dL   Triglycerides 114 0 - 149 mg/dL   HDL 60 >39 mg/dL   VLDL Cholesterol Cal 23 5 - 40 mg/dL   LDL Calculated 61 0 - 99 mg/dL   Chol/HDL Ratio 2.4 0.0 - 4.4 ratio units  TSH  Result Value Ref Range   TSH 3.520 0.450 - 4.500 uIU/mL  Urinalysis, Routine w reflex microscopic (not at [facility])  Result Value Ref Range   Specific Gravity, UA 1.010 1.005 - 1.030   pH, UA 7.0 5.0 - 7.5   Color, UA Yellow Yellow   Appearance Ur Clear Clear   Leukocytes, UA 1+ (A) Negative   Protein, UA Negative Negative/Trace   Glucose, UA Negative Negative   Ketones, UA Negative Negative   RBC, UA Negative Negative   Bilirubin, UA Negative Negative   Urobilinogen, Ur 0.2 0.2 - 1.0 mg/dL   Nitrite, UA Negative Negative   Microscopic Examination See below:       Assessment & Plan:   Problem List Items Addressed This Visit      Cardiovascular and Mediastinum   Myocardial infarction ([place])   Relevant Medications   ezetimibe (ZETIA) 10 MG tablet   CAD (coronary artery disease)   Relevant Medications   ezetimibe (ZETIA) 10 MG tablet     Endocrine   Hypothyroidism     Other   Hyperlipidemia    The current medical regimen is effective;  continue present plan and medications.       Relevant Medications   ezetimibe (ZETIA) 10 MG tablet   Breast cancer, stage 1, estrogen receptor positive ([place])    Stable with meds       Other Visit Diagnoses    Essential hypertension, benign    -  Primary    Relevant Medications    ezetimibe (ZETIA) 10 MG tablet    Other Relevant Orders    LP+ALT+AST Piccolo, Waived    Basic metabolic panel    Hyperlipemia        Relevant Medications    ezetimibe (ZETIA) 10 MG tablet    Other Relevant Orders    LP+ALT+AST Piccolo, Waived    Basic metabolic panel      ALso discuss weight loss diet exercise  nutrition  Follow up plan: Return in about  6 months (around 01/09/2016) for Physical Exam.

## 2015-07-12 NOTE — Assessment & Plan Note (Signed)
The current medical regimen is effective;  continue present plan and medications.  

## 2015-07-13 ENCOUNTER — Encounter: Payer: Self-pay | Admitting: Family Medicine

## 2015-07-13 LAB — BASIC METABOLIC PANEL
BUN/Creatinine Ratio: 20 (ref 11–26)
BUN: 14 mg/dL (ref 8–27)
CALCIUM: 9.7 mg/dL (ref 8.7–10.3)
CHLORIDE: 102 mmol/L (ref 96–106)
CO2: 25 mmol/L (ref 18–29)
CREATININE: 0.7 mg/dL (ref 0.57–1.00)
GFR calc Af Amer: 105 mL/min/{1.73_m2} (ref >59)
GFR calc non Af Amer: 91 mL/min/{1.73_m2} (ref >59)
GLUCOSE: 100 mg/dL — AB (ref 65–99)
Potassium: 4.9 mmol/L (ref 3.5–5.2)
Sodium: 141 mmol/L (ref 134–144)

## 2015-07-24 ENCOUNTER — Telehealth: Payer: Self-pay | Admitting: Family Medicine

## 2015-07-24 MED ORDER — AZITHROMYCIN 250 MG PO TABS: ORAL_TABLET | ORAL | Status: DC

## 2015-07-24 NOTE — Telephone Encounter (Signed)
Pt called and stated that she was sick and the congestion was making it difficult for her to sleep and she would like to have something called in to [place].

## 2015-07-24 NOTE — Telephone Encounter (Signed)
Call pt 

## 2015-07-24 NOTE — Telephone Encounter (Signed)
Patient with sinusitis Will call in medication and discussed care and treatment

## 2015-08-02 ENCOUNTER — Other Ambulatory Visit: Payer: Self-pay | Admitting: *Deleted

## 2015-08-02 ENCOUNTER — Encounter (INDEPENDENT_AMBULATORY_CARE_PROVIDER_SITE_OTHER): Payer: Self-pay

## 2015-08-02 ENCOUNTER — Ambulatory Visit
Admission: RE | Admit: 2015-08-02 | Discharge: 2015-08-02 | Disposition: A | Discharge status: Home / Self Care | Payer: Medicare Other | Source: Ambulatory Visit | Attending: Radiation Oncology | Admitting: Radiation Oncology

## 2015-08-02 ENCOUNTER — Encounter: Payer: Self-pay | Admitting: Radiation Oncology

## 2015-08-02 VITALS — BP 128/79 | HR 64 | Temp 97.8°F | Wt 228.7 lb

## 2015-08-02 DIAGNOSIS — C50312 Malignant neoplasm of lower-inner quadrant of left female breast: Secondary | ICD-10-CM

## 2015-08-02 DIAGNOSIS — Z170 Estrogen receptor positive status [ER+]: Secondary | ICD-10-CM | POA: Diagnosis not present

## 2015-08-02 DIAGNOSIS — C50812 Malignant neoplasm of overlapping sites of left female breast: Secondary | ICD-10-CM | POA: Diagnosis not present

## 2015-08-02 NOTE — Progress Notes (Signed)
Radiation Oncology Follow up Note  Name: Annette Porter   Date:   08/02/2015 MRN:  [id number] DOB: 1951/04/29    This 65 y.o. 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  REFERRING PROVIDER: Guadalupe Maple, MD  HPI: Patient is an interesting 65 year old female status post etc. 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 She is seen today in routine follow-up and is doing well. She specifically denies breast tenderness cough or bone pain. Currently on letrozole following that well without side effect.. Recent mammogram back in October 2016 was BI-RADS 2 benign findings.  COMPLICATIONS OF TREATMENT: none  FOLLOW UP COMPLIANCE: keeps appointments   PHYSICAL EXAM:  BP 128/79 mmHg  Pulse 64  Temp(Src) 97.8 F (36.6 C)  Wt 228 lb 11.6 oz (103.75 kg) A well-developed slightly obese female in NAD.Lungs are clear to A&P cardiac examination essentially unremarkable with regular rate and rhythm. No dominant mass or nodularity is noted in either breast in 2 positions examined. Incision is well-healed. No axillary or supraclavicular adenopathy is appreciated. Cosmetic result is excellent. There is some thickening in the upper outer quadrant of the left breast stable over time. Well-developed well-nourished patient in NAD. HEENT reveals PERLA, EOMI, discs not visualized.  Oral cavity is clear. No oral mucosal lesions are identified. Neck is clear without evidence of cervical or supraclavicular adenopathy. Lungs are clear to A&P. Cardiac examination is essentially unremarkable with regular rate and rhythm without murmur rub or thrill. Abdomen is benign with no organomegaly  or masses noted. Motor sensory and DTR levels are equal and symmetric in the upper and lower extremities. Cranial nerves II through XII are grossly intact. Proprioception is intact. No peripheral adenopathy or edema is identified. No motor or sensory levels are noted. Crude visual fields are within normal range.  RADIOLOGY RESULTS: October mammograms are reviewed compatible with the above-stated findings  PLAN: At the present time she is doing well with no evidence of disease. I'm please were overall progress. I've asked to see her back in 1 year for follow-up. She knows to call sooner with any concerns. She continues on letrozole without side effect.  I would like to take this opportunity for allowing me to participate in the care of your patient.Armstead Peaks., MD

## 2015-09-20 DIAGNOSIS — Z6841 Body Mass Index (BMI) 40.0 and over, adult: Secondary | ICD-10-CM | POA: Diagnosis not present

## 2015-09-20 DIAGNOSIS — E782 Mixed hyperlipidemia: Secondary | ICD-10-CM | POA: Diagnosis not present

## 2015-09-20 DIAGNOSIS — I25118 Atherosclerotic heart disease of native coronary artery with other forms of angina pectoris: Secondary | ICD-10-CM | POA: Diagnosis not present

## 2015-09-20 DIAGNOSIS — E119 Type 2 diabetes mellitus without complications: Secondary | ICD-10-CM | POA: Diagnosis not present

## 2015-11-27 ENCOUNTER — Other Ambulatory Visit: Payer: Self-pay | Admitting: Family Medicine

## 2016-01-10 ENCOUNTER — Encounter: Payer: Self-pay | Admitting: Family Medicine

## 2016-01-10 ENCOUNTER — Ambulatory Visit (INDEPENDENT_AMBULATORY_CARE_PROVIDER_SITE_OTHER): Payer: Medicare Other | Admitting: Family Medicine

## 2016-01-10 VITALS — BP 117/72 | HR 62 | Temp 98.0°F | Ht 61.1 in | Wt 218.0 lb

## 2016-01-10 DIAGNOSIS — E039 Hypothyroidism, unspecified: Secondary | ICD-10-CM

## 2016-01-10 DIAGNOSIS — Z170 Estrogen receptor positive status [ER+]: Secondary | ICD-10-CM

## 2016-01-10 DIAGNOSIS — E785 Hyperlipidemia, unspecified: Secondary | ICD-10-CM | POA: Diagnosis not present

## 2016-01-10 DIAGNOSIS — I10 Essential (primary) hypertension: Secondary | ICD-10-CM

## 2016-01-10 DIAGNOSIS — Z0000 Encounter for general adult medical examination without abnormal findings: Secondary | ICD-10-CM

## 2016-01-10 DIAGNOSIS — I2510 Atherosclerotic heart disease of native coronary artery without angina pectoris: Secondary | ICD-10-CM

## 2016-01-10 DIAGNOSIS — C50912 Malignant neoplasm of unspecified site of left female breast: Secondary | ICD-10-CM | POA: Diagnosis not present

## 2016-01-10 DIAGNOSIS — I2583 Coronary atherosclerosis due to lipid rich plaque: Secondary | ICD-10-CM

## 2016-01-10 DIAGNOSIS — K219 Gastro-esophageal reflux disease without esophagitis: Secondary | ICD-10-CM | POA: Insufficient documentation

## 2016-01-10 LAB — URINALYSIS, ROUTINE W REFLEX MICROSCOPIC
BILIRUBIN UA: NEGATIVE
Glucose, UA: NEGATIVE
Ketones, UA: NEGATIVE
Nitrite, UA: NEGATIVE
PH UA: 5.5 (ref 5.0–7.5)
PROTEIN UA: NEGATIVE
RBC UA: NEGATIVE
SPEC GRAV UA: 1.015 (ref 1.005–1.030)
Urobilinogen, Ur: 0.2 mg/dL (ref 0.2–1.0)

## 2016-01-10 LAB — MICROSCOPIC EXAMINATION

## 2016-01-10 MED ORDER — IBUPROFEN 800 MG PO TABS: 800.0000 mg | ORAL_TABLET | Freq: Three times a day (TID) | ORAL | 0 refills | Status: DC | PRN

## 2016-01-10 MED ORDER — EZETIMIBE 10 MG PO TABS: 10.0000 mg | ORAL_TABLET | Freq: Every day | ORAL | 4 refills | Status: DC

## 2016-01-10 MED ORDER — LEVOTHYROXINE SODIUM 125 MCG PO TABS: 125.0000 ug | ORAL_TABLET | Freq: Every day | ORAL | 4 refills | Status: DC

## 2016-01-10 MED ORDER — ATORVASTATIN CALCIUM 40 MG PO TABS: 40.0000 mg | ORAL_TABLET | Freq: Every day | ORAL | 4 refills | Status: DC

## 2016-01-10 NOTE — Assessment & Plan Note (Signed)
Followed by Dr. Tollie Pizza and taking Femara

## 2016-01-10 NOTE — Assessment & Plan Note (Signed)
The current medical regimen is effective;  continue present plan and medications.  

## 2016-01-10 NOTE — Progress Notes (Signed)
BP 117/72 (BP Location: Right Arm, Patient Position: Sitting, Cuff Size: Normal)   Pulse 62   Temp 98 F (36.7 C)   Ht 5' 1.1" (1.552 m)   Wt 218 lb (98.9 kg)   SpO2 96%   BMI 41.06 kg/m    Subjective:    Patient ID: Annette Porter, female    DOB: 17-Jan-1951, 65 y.o.   MRN: [id number]  HPI: Annette Porter is a 65 y.o. female  Chief Complaint  Patient presents with  . Annual Exam  Annual wellness exam Medicare metrics met Medical problems hypertension doing well no complaints takes medications faithfully with good control of blood pressure Coronary artery disease stable followed by cardiology with good reports has stress test scheduled for this fall. Patient with no chest pain chest tightness or other cardiovascular symptoms Reflux stable Thyroid stable takes medications faithfully Breast cancer from history resolved with no further issues.   Relevant past medical, surgical, family and social history reviewed and updated as indicated. Interim medical history since our last visit reviewed. Allergies and medications reviewed and updated.  Review of Systems  Constitutional: Negative.   HENT: Negative.   Eyes: Negative.   Respiratory: Negative.   Cardiovascular: Negative.   Gastrointestinal: Negative.   Endocrine: Negative.   Genitourinary: Negative.   Musculoskeletal: Negative.   Skin: Negative.   Allergic/Immunologic: Negative.   Neurological: Negative.   Hematological: Negative.   Psychiatric/Behavioral: Negative.     Per HPI unless specifically indicated above     Objective:    BP 117/72 (BP Location: Right Arm, Patient Position: Sitting, Cuff Size: Normal)   Pulse 62   Temp 98 F (36.7 C)   Ht 5' 1.1" (1.552 m)   Wt 218 lb (98.9 kg)   SpO2 96%   BMI 41.06 kg/m   Wt Readings from Last 3 Encounters:  01/10/16 218 lb (98.9 kg)  08/02/15 228 lb 11.6 oz (103.8 kg)  07/12/15 227 lb (103 kg)    Physical Exam  Constitutional: She is oriented to person,  place, and time. She appears well-developed and well-nourished.  HENT:  Head: Normocephalic and atraumatic.  Right Ear: External ear normal.  Left Ear: External ear normal.  Nose: Nose normal.  Mouth/Throat: Oropharynx is clear and moist.  Eyes: Conjunctivae and EOM are normal. Pupils are equal, round, and reactive to light.  Neck: Normal range of motion. Neck supple. Carotid bruit is not present.  Cardiovascular: Normal rate, regular rhythm and normal heart sounds.   No murmur heard. Pulmonary/Chest: Effort normal and breath sounds normal. She exhibits no mass. Right breast exhibits no mass, no skin change and no tenderness. Left breast exhibits no mass, no skin change and no tenderness. Breasts are symmetrical.  Left breast with surgical scar  Abdominal: Soft. Bowel sounds are normal. There is no hepatosplenomegaly.  Musculoskeletal: Normal range of motion.  Neurological: She is alert and oriented to person, place, and time.  Skin: No rash noted.  Psychiatric: She has a normal mood and affect. Her behavior is normal. Judgment and thought content normal.    Results for orders placed or performed in visit on 07/12/15  LP+ALT+AST Piccolo, [employer]  Result Value Ref Range   ALT (SGPT) Piccolo, Waived 28 10 - 47 U/L   AST (SGOT) Piccolo, Waived 27 11 - 38 U/L   Cholesterol Piccolo, Waived 135 <200 mg/dL   HDL Chol Piccolo, Waived 55 (L) >59 mg/dL   Triglycerides Piccolo,Waived 94 <150 mg/dL   Chol/HDL Ratio  Piccolo,Waive 2.5 mg/dL   LDL Chol Calc Piccolo Waived 61 <100 mg/dL   VLDL Chol Calc Piccolo,Waive 19 <30 mg/dL  Basic metabolic panel  Result Value Ref Range   Glucose 100 (H) 65 - 99 mg/dL   BUN 14 8 - 27 mg/dL   Creatinine, Ser 0.70 0.57 - 1.00 mg/dL   GFR calc non Af Amer 91 >59 mL/min/1.73   GFR calc Af Amer 105 >59 mL/min/1.73   BUN/Creatinine Ratio 20 11 - 26   Sodium 141 134 - 144 mmol/L   Potassium 4.9 3.5 - 5.2 mmol/L   Chloride 102 96 - 106 mmol/L   CO2 25 18 - 29  mmol/L   Calcium 9.7 8.7 - 10.3 mg/dL      Assessment & Plan:   Problem List Items Addressed This Visit      Cardiovascular and Mediastinum   Hypertension    The current medical regimen is effective;  continue present plan and medications.       Relevant Medications   atorvastatin (LIPITOR) 40 MG tablet   ezetimibe (ZETIA) 10 MG tablet   CAD (coronary artery disease)    The current medical regimen is effective;  continue present plan and medications.       Relevant Medications   atorvastatin (LIPITOR) 40 MG tablet   ezetimibe (ZETIA) 10 MG tablet     Digestive   GERD (gastroesophageal reflux disease)    The current medical regimen is effective;  continue present plan and medications.         Endocrine   Hypothyroidism    The current medical regimen is effective;  continue present plan and medications.       Relevant Medications   levothyroxine (SYNTHROID, LEVOTHROID) 125 MCG tablet   Other Relevant Orders   CBC with Differential/Platelet   TSH     Other   Breast cancer, stage 1, estrogen receptor positive ([facility])    Followed by Dr. Tollie Pizza and taking Femara      Hyperlipidemia   Relevant Medications   atorvastatin (LIPITOR) 40 MG tablet   ezetimibe (ZETIA) 10 MG tablet    Other Visit Diagnoses    Annual physical exam    -  Primary   Relevant Orders   CBC with Differential/Platelet   Comprehensive metabolic panel   Lipid Panel w/o Chol/HDL Ratio   TSH   Urinalysis, Routine w reflex microscopic (not at [facility])       Follow up plan: Return in about 6 months (around 07/12/2016), or if symptoms worsen or fail to improve, for bmp, lipids, alt, ast.

## 2016-01-11 ENCOUNTER — Encounter: Payer: Self-pay | Admitting: Family Medicine

## 2016-01-11 LAB — COMPREHENSIVE METABOLIC PANEL
ALT: 23 IU/L (ref 0–32)
AST: 22 IU/L (ref 0–40)
Albumin/Globulin Ratio: 1.7 (ref 1.2–2.2)
Albumin: 4.3 g/dL (ref 3.6–4.8)
Alkaline Phosphatase: 84 IU/L (ref 39–117)
BUN / CREAT RATIO: 24 (ref 12–28)
BUN: 16 mg/dL (ref 8–27)
Bilirubin Total: 0.3 mg/dL (ref 0.0–1.2)
CALCIUM: 9.6 mg/dL (ref 8.7–10.3)
CO2: 23 mmol/L (ref 18–29)
CREATININE: 0.68 mg/dL (ref 0.57–1.00)
Chloride: 101 mmol/L (ref 96–106)
GFR calc Af Amer: 105 mL/min/{1.73_m2} (ref >59)
GFR calc non Af Amer: 91 mL/min/{1.73_m2} (ref >59)
GLUCOSE: 94 mg/dL (ref 65–99)
Globulin, Total: 2.6 g/dL (ref 1.5–4.5)
Potassium: 4.4 mmol/L (ref 3.5–5.2)
SODIUM: 139 mmol/L (ref 134–144)
TOTAL PROTEIN: 6.9 g/dL (ref 6.0–8.5)

## 2016-01-11 LAB — CBC WITH DIFFERENTIAL/PLATELET
BASOS ABS: 0 10*3/uL (ref 0.0–0.2)
Basos: 0 %
EOS (ABSOLUTE): 0.3 10*3/uL (ref 0.0–0.4)
Eos: 4 %
Hematocrit: 42 % (ref 34.0–46.6)
Hemoglobin: 13.9 g/dL (ref 11.1–15.9)
IMMATURE GRANS (ABS): 0 10*3/uL (ref 0.0–0.1)
IMMATURE GRANULOCYTES: 0 %
LYMPHS: 24 %
Lymphocytes Absolute: 1.9 10*3/uL (ref 0.7–3.1)
MCH: 30.2 pg (ref 26.6–33.0)
MCHC: 33.1 g/dL (ref 31.5–35.7)
MCV: 91 fL (ref 79–97)
MONOS ABS: 0.7 10*3/uL (ref 0.1–0.9)
Monocytes: 10 %
NEUTROS PCT: 62 %
Neutrophils Absolute: 4.8 10*3/uL (ref 1.4–7.0)
PLATELETS: 284 10*3/uL (ref 150–379)
RBC: 4.61 x10E6/uL (ref 3.77–5.28)
RDW: 13.9 % (ref 12.3–15.4)
WBC: 7.8 10*3/uL (ref 3.4–10.8)

## 2016-01-11 LAB — LIPID PANEL W/O CHOL/HDL RATIO
CHOLESTEROL TOTAL: 150 mg/dL (ref 100–199)
HDL: 50 mg/dL (ref >39)
LDL CALC: 78 mg/dL (ref 0–99)
TRIGLYCERIDES: 110 mg/dL (ref 0–149)
VLDL Cholesterol Cal: 22 mg/dL (ref 5–40)

## 2016-01-11 LAB — TSH: TSH: 0.642 u[IU]/mL (ref 0.450–4.500)

## 2016-02-07 ENCOUNTER — Other Ambulatory Visit: Payer: Self-pay | Admitting: *Deleted

## 2016-02-07 DIAGNOSIS — C50312 Malignant neoplasm of lower-inner quadrant of left female breast: Secondary | ICD-10-CM

## 2016-02-19 ENCOUNTER — Inpatient Hospital Stay
Admission: RE | Admit: 2016-02-19 | Discharge: 2016-02-19 | Disposition: A | Discharge status: Home / Self Care | Payer: Self-pay | Source: Ambulatory Visit | Attending: *Deleted | Admitting: *Deleted

## 2016-02-19 ENCOUNTER — Other Ambulatory Visit: Payer: Self-pay | Admitting: *Deleted

## 2016-02-19 DIAGNOSIS — Z1231 Encounter for screening mammogram for malignant neoplasm of breast: Secondary | ICD-10-CM

## 2016-02-20 ENCOUNTER — Encounter: Payer: Self-pay | Admitting: *Deleted

## 2016-03-19 ENCOUNTER — Ambulatory Visit (INDEPENDENT_AMBULATORY_CARE_PROVIDER_SITE_OTHER): Payer: Medicare Other

## 2016-03-19 DIAGNOSIS — Z23 Encounter for immunization: Secondary | ICD-10-CM | POA: Diagnosis not present

## 2016-03-27 ENCOUNTER — Other Ambulatory Visit: Payer: Self-pay | Admitting: General Surgery

## 2016-03-27 ENCOUNTER — Ambulatory Visit
Admission: RE | Admit: 2016-03-27 | Discharge: 2016-03-27 | Disposition: A | Discharge status: Home / Self Care | Payer: Medicare Other | Source: Ambulatory Visit | Attending: General Surgery | Admitting: General Surgery

## 2016-03-27 ENCOUNTER — Encounter: Payer: Self-pay | Admitting: *Deleted

## 2016-03-27 DIAGNOSIS — Z853 Personal history of malignant neoplasm of breast: Secondary | ICD-10-CM | POA: Insufficient documentation

## 2016-03-27 DIAGNOSIS — C50312 Malignant neoplasm of lower-inner quadrant of left female breast: Secondary | ICD-10-CM

## 2016-03-27 DIAGNOSIS — R922 Inconclusive mammogram: Secondary | ICD-10-CM | POA: Diagnosis not present

## 2016-04-02 ENCOUNTER — Ambulatory Visit (INDEPENDENT_AMBULATORY_CARE_PROVIDER_SITE_OTHER): Payer: Medicare Other | Admitting: General Surgery

## 2016-04-02 ENCOUNTER — Encounter: Payer: Self-pay | Admitting: General Surgery

## 2016-04-02 VITALS — BP 130/72 | HR 76 | Resp 12 | Ht 64.0 in | Wt 213.0 lb

## 2016-04-02 DIAGNOSIS — I2583 Coronary atherosclerosis due to lipid rich plaque: Secondary | ICD-10-CM | POA: Diagnosis not present

## 2016-04-02 DIAGNOSIS — I2510 Atherosclerotic heart disease of native coronary artery without angina pectoris: Secondary | ICD-10-CM | POA: Diagnosis not present

## 2016-04-02 DIAGNOSIS — C50312 Malignant neoplasm of lower-inner quadrant of left female breast: Secondary | ICD-10-CM

## 2016-04-02 NOTE — Patient Instructions (Signed)
The patient has been asked to return to the office in one year with a bilateral diagnostic mammogram. 

## 2016-04-02 NOTE — Progress Notes (Signed)
Patient ID: Annette Porter, female   DOB: April 14, 1951, 65 y.o.   MRN: [id number]  Chief Complaint  Patient presents with  . Other    mammogram    HPI Annette Porter is a 65 y.o. female who presents for a breast evaluation. The most recent mammogram was done on 03/27/2016.  Patient does perform regular self breast checks and gets regular mammograms done.    HPI  Past Medical History:  Diagnosis Date  . CAD (coronary artery disease)   . GERD (gastroesophageal reflux disease)   . Hyperlipidemia   . Hypertension   . Hypothyroidism   . Morbid obesity ([place])   . Myocardial infarction 07/1998    Past Surgical History:  Procedure Laterality Date  . BREAST BIOPSY Left 03/22/13    stereo  . 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 BREAST LUMPECTOMY WITH MAMMOSITE CAVITY EVALUATION DEVICE Left 2014  . BREAST LUMPECTOMY WITH MAMMOSITE CAVITY EVALUATION DEVICE Left 2015  . BREAST MAMMOSITE  05/20/13  . BREAST SURGERY Left 04/29/13   wide excision  . BREAST SURGERY Left 12-16-13   Wide excision with SN biopsy, INVASIVE MAMMARY CARCINOMA  . CARDIAC CATHETERIZATION    . COLONOSCOPY  2006  . COLONOSCOPY WITH PROPOFOL N/A 05/31/2015   Procedure: COLONOSCOPY WITH PROPOFOL;  Surgeon: Robert Bellow, MD;  Location: [facility] END[facility]COPY;  Service: Endoscopy;  Laterality: N/A;  . HAND SURGERY    . KNEE ARTHROSCOPY WITH MEDIAL MENISECTOMY Left 02/24/2015   Procedure: KNEE ARTHROSCOPY WITH PARTIAL MEDIAL MENISECTOMY;  Surgeon: Christophe Louis, MD;  Location: [facility] ORS;  Service: Orthopedics;  Laterality: Left;    Family History  Problem Relation Age of Onset  . Diabetes Mother   . Breast cancer Neg Hx     Social History Social History  Substance Use Topics  . Smoking status: Never Smoker  . Smokeless tobacco: Never Used  . Alcohol use Yes     Comment: causal    Allergies  Allergen Reactions  . Dilaudid [Hydromorphone] Nausea And Vomiting  . Hydrocodone Itching  .  Tape Other (See Comments)    irratation    Current Outpatient Prescriptions  Medication Sig Dispense Refill  . aspirin 81 MG tablet Take 81 mg by mouth daily.    Marland Kitchen atorvastatin (LIPITOR) 40 MG tablet Take 1 tablet (40 mg total) by mouth daily. 90 tablet 4  . Calcium Carbonate (CALCIUM 600 PO) Take 1 tablet by mouth 2 (two) times daily.    . Cholecalciferol (VITAMIN D3) 1000 UNITS CAPS Take 1 capsule by mouth daily.    Marland Kitchen ezetimibe (ZETIA) 10 MG tablet Take 1 tablet (10 mg total) by mouth daily. 90 tablet 4  . ibuprofen (ADVIL,MOTRIN) 800 MG tablet Take 1 tablet (800 mg total) by mouth 3 (three) times daily as needed. 270 tablet 0  . isosorbide mononitrate (IMDUR) 30 MG 24 hr tablet Take 30 mg by mouth daily.    . isosorbide mononitrate (IMDUR) 60 MG 24 hr tablet Take 1 tablet by mouth daily.    Marland Kitchen letrozole (FEMARA) 2.5 MG tablet TAKE ONE TABLET BY MOUTH ONCE DAILY 90 tablet 3  . levothyroxine (SYNTHROID, LEVOTHROID) 125 MCG tablet Take 1 tablet (125 mcg total) by mouth daily. 90 tablet 4  . metoprolol succinate (TOPROL-XL) 50 MG 24 hr tablet Take 1 tablet by mouth daily.    . nitroGLYCERIN (NITROSTAT) 0.4 MG SL tablet Place 0.4 mg under the tongue every 5 (five) minutes as needed for  chest pain.    . ranitidine (ZANTAC) 150 MG tablet Take 150 mg by mouth at bedtime.     . vitamin B-12 (CYANOCOBALAMIN) 250 MCG tablet Take 500 mcg by mouth daily.      No current facility-administered medications for this visit.     Review of Systems Review of Systems  Constitutional: Negative.   Respiratory: Negative.   Cardiovascular: Negative.     Blood pressure 130/72, pulse 76, resp. rate 12, height 5\' 4"  (1.626 m), weight 213 lb (96.6 kg).  Physical Exam Physical Exam  Constitutional: She is oriented to person, place, and time. She appears well-developed and well-nourished.  Eyes: Conjunctivae are normal. No scleral icterus.  Neck: Neck supple.  Cardiovascular: Normal rate, regular rhythm and  normal heart sounds.   Pulmonary/Chest: Effort normal and breath sounds normal. Right breast exhibits tenderness. Right breast exhibits no inverted nipple, no mass, no nipple discharge and no skin change. Left breast exhibits no inverted nipple, no mass, no nipple discharge, no skin change and no tenderness.    Abdominal: Soft. Bowel sounds are normal. There is no tenderness.  Lymphadenopathy:    She has no cervical adenopathy.    She has no axillary adenopathy.  Neurological: She is alert and oriented to person, place, and time.  Skin: Skin is warm and dry.    Data Reviewed Bilateral mammograms dated 03/27/2016 were reviewed. Postsurgical change. BI-RADS-2.  Assessment    Doing well now 1 and 2 years status post treatment of multifocal left breast cancer.    Plan    She will continue her antiestrogen therapy.    The patient has been asked to return to the office in one year with a bilateral diagnostic mammogram.  This information has been scribed by Gaspar Cola CMA.   Robert Bellow 04/03/2016, 5:28 PM

## 2016-04-17 DIAGNOSIS — I25118 Atherosclerotic heart disease of native coronary artery with other forms of angina pectoris: Secondary | ICD-10-CM | POA: Diagnosis not present

## 2016-05-27 ENCOUNTER — Encounter: Payer: Self-pay | Admitting: Family Medicine

## 2016-05-27 ENCOUNTER — Ambulatory Visit (INDEPENDENT_AMBULATORY_CARE_PROVIDER_SITE_OTHER): Payer: Medicare Other | Admitting: Family Medicine

## 2016-05-27 VITALS — BP 148/73 | HR 71 | Temp 98.4°F | Wt 220.6 lb

## 2016-05-27 DIAGNOSIS — I2583 Coronary atherosclerosis due to lipid rich plaque: Secondary | ICD-10-CM

## 2016-05-27 DIAGNOSIS — I2510 Atherosclerotic heart disease of native coronary artery without angina pectoris: Secondary | ICD-10-CM

## 2016-05-27 DIAGNOSIS — J019 Acute sinusitis, unspecified: Secondary | ICD-10-CM | POA: Diagnosis not present

## 2016-05-27 MED ORDER — BENZONATATE 100 MG PO CAPS: 100.0000 mg | ORAL_CAPSULE | Freq: Two times a day (BID) | ORAL | 0 refills | Status: DC | PRN

## 2016-05-27 MED ORDER — AMOXICILLIN-POT CLAVULANATE 875-125 MG PO TABS: 1.0000 | ORAL_TABLET | Freq: Two times a day (BID) | ORAL | 0 refills | Status: DC

## 2016-05-27 NOTE — Progress Notes (Signed)
BP (!) 148/73 (BP Location: Right Arm, Patient Position: Sitting, Cuff Size: Large)   Pulse 71   Temp 98.4 F (36.9 C)   Wt 220 lb 9.6 oz (100.1 kg)   SpO2 96%   BMI 37.87 kg/m    Subjective:    Patient ID: Annette Porter, female    DOB: Aug 23, 1950, 65 y.o.   MRN: [id number]  HPI: Annette Porter is a 65 y.o. female  Chief Complaint  Patient presents with  . URI    X 1 week  Patient with a week plus of feeling bad started more with chest congestion then drainage then sinus pressure feeling bad so had some feverish aches and chills with sweating has a lot of hoarseness developing with heart barking cough starting now with over the last several days mucus is gone from clear to cloudy. Stride over-the-counter medications without much relief for success. Of course time pressured with this being Christmas week.  Relevant past medical, surgical, family and social history reviewed and updated as indicated. Interim medical history since our last visit reviewed. Allergies and medications reviewed and updated.  Review of Systems  Constitutional: Positive for chills, diaphoresis, fatigue and fever.  HENT: Positive for congestion, postnasal drip, rhinorrhea, sinus pain, sinus pressure, sneezing and sore throat.   Respiratory: Positive for cough. Negative for choking and shortness of breath.   Cardiovascular: Negative.     Per HPI unless specifically indicated above     Objective:    BP (!) 148/73 (BP Location: Right Arm, Patient Position: Sitting, Cuff Size: Large)   Pulse 71   Temp 98.4 F (36.9 C)   Wt 220 lb 9.6 oz (100.1 kg)   SpO2 96%   BMI 37.87 kg/m   Wt Readings from Last 3 Encounters:  05/27/16 220 lb 9.6 oz (100.1 kg)  04/02/16 213 lb (96.6 kg)  01/10/16 218 lb (98.9 kg)    Physical Exam  Constitutional: She is oriented to person, place, and time. She appears well-developed and well-nourished. No distress.  HENT:  Head: Normocephalic and atraumatic.  Right Ear:  Hearing and external ear normal.  Left Ear: Hearing and external ear normal.  Nose: Nose normal.  Mouth/Throat: Oropharyngeal exudate present.  Eyes: Conjunctivae and lids are normal. Right eye exhibits no discharge. Left eye exhibits no discharge. No scleral icterus.  Cardiovascular: Normal rate, regular rhythm and normal heart sounds.   Pulmonary/Chest: Effort normal and breath sounds normal. No respiratory distress.  Musculoskeletal: Normal range of motion.  Neurological: She is alert and oriented to person, place, and time.  Skin: Skin is intact. No rash noted.  Psychiatric: She has a normal mood and affect. Her speech is normal and behavior is normal. Judgment and thought content normal. Cognition and memory are normal.    Results for orders placed or performed in visit on 01/10/16  Microscopic Examination  Result Value Ref Range   WBC, UA 0-5 0 - 5 /hpf   RBC, UA 0-2 0 - 2 /hpf   Epithelial Cells (non renal) 0-10 0 - 10 /hpf   Bacteria, UA Few None seen/Few  CBC with Differential/Platelet  Result Value Ref Range   WBC 7.8 3.4 - 10.8 x10E3/uL   RBC 4.61 3.77 - 5.28 x10E6/uL   Hemoglobin 13.9 11.1 - 15.9 g/dL   Hematocrit 42.0 34.0 - 46.6 %   MCV 91 79 - 97 fL   MCH 30.2 26.6 - 33.0 pg   MCHC 33.1 31.5 - 35.7 g/dL  RDW 13.9 12.3 - 15.4 %   Platelets 284 150 - 379 x10E3/uL   Neutrophils 62 %   Lymphs 24 %   Monocytes 10 %   Eos 4 %   Basos 0 %   Neutrophils Absolute 4.8 1.4 - 7.0 x10E3/uL   Lymphocytes Absolute 1.9 0.7 - 3.1 x10E3/uL   Monocytes Absolute 0.7 0.1 - 0.9 x10E3/uL   EOS (ABSOLUTE) 0.3 0.0 - 0.4 x10E3/uL   Basophils Absolute 0.0 0.0 - 0.2 x10E3/uL   Immature Granulocytes 0 %   Immature Grans (Abs) 0.0 0.0 - 0.1 x10E3/uL  Comprehensive metabolic panel  Result Value Ref Range   Glucose 94 65 - 99 mg/dL   BUN 16 8 - 27 mg/dL   Creatinine, Ser 0.68 0.57 - 1.00 mg/dL   GFR calc non Af Amer 91 >59 mL/min/1.73   GFR calc Af Amer 105 >59 mL/min/1.73    BUN/Creatinine Ratio 24 12 - 28   Sodium 139 134 - 144 mmol/L   Potassium 4.4 3.5 - 5.2 mmol/L   Chloride 101 96 - 106 mmol/L   CO2 23 18 - 29 mmol/L   Calcium 9.6 8.7 - 10.3 mg/dL   Total Protein 6.9 6.0 - 8.5 g/dL   Albumin 4.3 3.6 - 4.8 g/dL   Globulin, Total 2.6 1.5 - 4.5 g/dL   Albumin/Globulin Ratio 1.7 1.2 - 2.2   Bilirubin Total 0.3 0.0 - 1.2 mg/dL   Alkaline Phosphatase 84 39 - 117 IU/L   AST 22 0 - 40 IU/L   ALT 23 0 - 32 IU/L  Lipid Panel w/o Chol/HDL Ratio  Result Value Ref Range   Cholesterol, Total 150 100 - 199 mg/dL   Triglycerides 110 0 - 149 mg/dL   HDL 50 >39 mg/dL   VLDL Cholesterol Cal 22 5 - 40 mg/dL   LDL Calculated 78 0 - 99 mg/dL  TSH  Result Value Ref Range   TSH 0.642 0.450 - 4.500 uIU/mL  Urinalysis, Routine w reflex microscopic (not at [facility])  Result Value Ref Range   Specific Gravity, UA 1.015 1.005 - 1.030   pH, UA 5.5 5.0 - 7.5   Color, UA Yellow Yellow   Appearance Ur Clear Clear   Leukocytes, UA Trace (A) Negative   Protein, UA Negative Negative/Trace   Glucose, UA Negative Negative   Ketones, UA Negative Negative   RBC, UA Negative Negative   Bilirubin, UA Negative Negative   Urobilinogen, Ur 0.2 0.2 - 1.0 mg/dL   Nitrite, UA Negative Negative   Microscopic Examination See below:       Assessment & Plan:   Problem List Items Addressed This Visit    None    Visit Diagnoses    Acute sinusitis, recurrence not specified, unspecified location    -  Primary   Discussed sinusitis care and treatment use of medications Mucinex Tylenol Tylenol sinus and nasal rinse safe use of Augmentin and Tessalon   Relevant Medications   amoxicillin-clavulanate (AUGMENTIN) 875-125 MG tablet   benzonatate (TESSALON) 100 MG capsule       Follow up plan: Return for As scheduled.

## 2016-06-28 ENCOUNTER — Encounter: Payer: Self-pay | Admitting: *Deleted

## 2016-07-17 ENCOUNTER — Ambulatory Visit (INDEPENDENT_AMBULATORY_CARE_PROVIDER_SITE_OTHER): Payer: Medicare Other | Admitting: Family Medicine

## 2016-07-17 ENCOUNTER — Encounter: Payer: Self-pay | Admitting: Family Medicine

## 2016-07-17 VITALS — BP 151/83 | HR 64 | Temp 98.2°F | Ht 61.3 in | Wt 222.2 lb

## 2016-07-17 DIAGNOSIS — I10 Essential (primary) hypertension: Secondary | ICD-10-CM

## 2016-07-17 DIAGNOSIS — I2510 Atherosclerotic heart disease of native coronary artery without angina pectoris: Secondary | ICD-10-CM | POA: Diagnosis not present

## 2016-07-17 DIAGNOSIS — I2583 Coronary atherosclerosis due to lipid rich plaque: Secondary | ICD-10-CM | POA: Diagnosis not present

## 2016-07-17 DIAGNOSIS — E785 Hyperlipidemia, unspecified: Secondary | ICD-10-CM

## 2016-07-17 LAB — LP+ALT+AST PICCOLO, WAIVED
ALT (SGPT) Piccolo, Waived: 26 U/L (ref 10–47)
AST (SGOT) Piccolo, Waived: 25 U/L (ref 11–38)
CHOL/HDL RATIO PICCOLO,WAIVE: 3.1 mg/dL
Cholesterol Piccolo, Waived: 148 mg/dL (ref <200)
HDL CHOL PICCOLO, WAIVED: 48 mg/dL — AB (ref >59)
LDL Chol Calc Piccolo Waived: 62 mg/dL (ref <100)
TRIGLYCERIDES PICCOLO,WAIVED: 188 mg/dL — AB (ref <150)
VLDL CHOL CALC PICCOLO,WAIVE: 38 mg/dL — AB (ref <30)

## 2016-07-17 MED ORDER — BENAZEPRIL HCL 40 MG PO TABS: 40.0000 mg | ORAL_TABLET | Freq: Every day | ORAL | 3 refills | Status: DC

## 2016-07-17 NOTE — Assessment & Plan Note (Signed)
The current medical regimen is effective;  continue present plan and medications.  

## 2016-07-17 NOTE — Assessment & Plan Note (Signed)
Discuss hypertension poor control will increase medication patient at maximum tolerated dose of metoprolol will and Benzapril 40 mg and follow up 1 month or so for blood pressure check kidney check.

## 2016-07-17 NOTE — Progress Notes (Signed)
BP (!) 151/83   Pulse 64   Temp 98.2 F (36.8 C) (Oral)   Ht 5' 1.3" (1.557 m)   Wt 222 lb 3.2 oz (100.8 kg)   SpO2 99%   BMI 41.57 kg/m    Subjective:    Patient ID: Annette Porter, female    DOB: 02-02-1951, 66 y.o.   MRN: [id number]  HPI: Annette Porter is a 66 y.o. female  Chief Complaint  Patient presents with  . Follow-up  . Hypertension   Patient follow-up blood pressure been taking medication metoprolol faithfully without problems having some shortness of breath but after normal stress test cardiology feels more deconditioning. Patient's enjoyed the holiday season and eating with some weight gain. Taking cholesterol medicines without problems or issues.  Relevant past medical, surgical, family and social history reviewed and updated as indicated. Interim medical history since our last visit reviewed. Allergies and medications reviewed and updated.  Review of Systems  Constitutional: Negative.   Respiratory: Negative.   Cardiovascular: Negative.     Per HPI unless specifically indicated above     Objective:    BP (!) 151/83   Pulse 64   Temp 98.2 F (36.8 C) (Oral)   Ht 5' 1.3" (1.557 m)   Wt 222 lb 3.2 oz (100.8 kg)   SpO2 99%   BMI 41.57 kg/m   Wt Readings from Last 3 Encounters:  07/17/16 222 lb 3.2 oz (100.8 kg)  05/27/16 220 lb 9.6 oz (100.1 kg)  04/02/16 213 lb (96.6 kg)    Physical Exam  Constitutional: She is oriented to person, place, and time. She appears well-developed and well-nourished. No distress.  HENT:  Head: Normocephalic and atraumatic.  Right Ear: Hearing normal.  Left Ear: Hearing normal.  Nose: Nose normal.  Eyes: Conjunctivae and lids are normal. Right eye exhibits no discharge. Left eye exhibits no discharge. No scleral icterus.  Cardiovascular: Normal rate, regular rhythm and normal heart sounds.   Pulmonary/Chest: Effort normal and breath sounds normal. No respiratory distress.  Musculoskeletal: Normal range of  motion.  Neurological: She is alert and oriented to person, place, and time.  Skin: Skin is intact. No rash noted.  Psychiatric: She has a normal mood and affect. Her speech is normal and behavior is normal. Judgment and thought content normal. Cognition and memory are normal.        Assessment & Plan:   Problem List Items Addressed This Visit      Cardiovascular and Mediastinum   Hypertension - Primary    Discuss hypertension poor control will increase medication patient at maximum tolerated dose of metoprolol will and Benzapril 40 mg and follow up 1 month or so for blood pressure check kidney check.      Relevant Medications   benazepril (LOTENSIN) 40 MG tablet   Other Relevant Orders   LP+ALT+AST Piccolo, Waived   Basic Metabolic Panel (BMET)   CAD (coronary artery disease)    The current medical regimen is effective;  continue present plan and medications.       Relevant Medications   benazepril (LOTENSIN) 40 MG tablet     Other   Hyperlipidemia    The current medical regimen is effective;  continue present plan and medications.       Relevant Medications   benazepril (LOTENSIN) 40 MG tablet   Other Relevant Orders   LP+ALT+AST Piccolo, Waived   Basic Metabolic Panel (BMET)       Follow up plan: Return in  about 4 weeks (around 08/14/2016) for BMP.

## 2016-07-18 ENCOUNTER — Encounter: Payer: Self-pay | Admitting: Family Medicine

## 2016-07-18 LAB — BASIC METABOLIC PANEL
BUN/Creatinine Ratio: 18 (ref 12–28)
BUN: 11 mg/dL (ref 8–27)
CALCIUM: 9.4 mg/dL (ref 8.7–10.3)
CHLORIDE: 104 mmol/L (ref 96–106)
CO2: 22 mmol/L (ref 18–29)
CREATININE: 0.6 mg/dL (ref 0.57–1.00)
GFR calc Af Amer: 110 mL/min/{1.73_m2} (ref >59)
GFR calc non Af Amer: 95 mL/min/{1.73_m2} (ref >59)
GLUCOSE: 97 mg/dL (ref 65–99)
Potassium: 4.6 mmol/L (ref 3.5–5.2)
Sodium: 143 mmol/L (ref 134–144)

## 2016-07-24 ENCOUNTER — Telehealth: Payer: Self-pay | Admitting: Family Medicine

## 2016-07-24 DIAGNOSIS — I10 Essential (primary) hypertension: Secondary | ICD-10-CM

## 2016-07-24 MED ORDER — HYDROCHLOROTHIAZIDE 25 MG PO TABS: 25.0000 mg | ORAL_TABLET | Freq: Every day | ORAL | 3 refills | Status: DC

## 2016-07-24 NOTE — Telephone Encounter (Signed)
Phone call Discussed with patient unable to take Benzapril and has developed a cough will discontinue Benzapril start hydrochlorothiazide will keep follow-up in a month with BMP.

## 2016-07-24 NOTE — Telephone Encounter (Signed)
Call pt 

## 2016-07-24 NOTE — Telephone Encounter (Signed)
  Last OV A&P from 07/17/16 OV:  Hypertension - Primary     Discuss hypertension poor control will increase medication patient at maximum tolerated dose of metoprolol will and Benzapril 40 mg and follow up 1 month or so for blood pressure check kidney check.    Pt's BP was 151/83 in the office that day.   Pt called today complaining of side effects from the Benzapril. Pt states she has been experiencing a headache (which pt states is not unusual for her so not sure it is from medication or not), frequent dry cough, light headedness, dizziness, racing heart. Pt states she has no energy, feels out of it, and has a heaviness in her legs.    Pt states she also is seeing no difference in BP. Pt taking BP daily. Log listed below.   07/19/16   144/74 07/20/16 170/82 (morning) 150/81 (evening) 07/21/16 166/81 (9 am) 145/73 (4 p.m.) 07/23/16  180/84  Please advise.   [phone]

## 2016-08-16 ENCOUNTER — Ambulatory Visit: Payer: Medicare Other | Admitting: Radiation Oncology

## 2016-08-28 ENCOUNTER — Encounter: Payer: Self-pay | Admitting: Family Medicine

## 2016-08-28 ENCOUNTER — Ambulatory Visit (INDEPENDENT_AMBULATORY_CARE_PROVIDER_SITE_OTHER): Payer: Medicare Other | Admitting: Family Medicine

## 2016-08-28 VITALS — BP 123/78 | HR 68 | Ht 60.0 in | Wt 214.0 lb

## 2016-08-28 DIAGNOSIS — I2510 Atherosclerotic heart disease of native coronary artery without angina pectoris: Secondary | ICD-10-CM

## 2016-08-28 DIAGNOSIS — E785 Hyperlipidemia, unspecified: Secondary | ICD-10-CM | POA: Diagnosis not present

## 2016-08-28 DIAGNOSIS — I2583 Coronary atherosclerosis due to lipid rich plaque: Secondary | ICD-10-CM

## 2016-08-28 DIAGNOSIS — I10 Essential (primary) hypertension: Secondary | ICD-10-CM

## 2016-08-28 MED ORDER — HYDROCHLOROTHIAZIDE 25 MG PO TABS: 25.0000 mg | ORAL_TABLET | Freq: Every day | ORAL | 2 refills | Status: DC

## 2016-08-28 NOTE — Assessment & Plan Note (Signed)
The current medical regimen is effective;  continue present plan and medications.  

## 2016-08-28 NOTE — Progress Notes (Signed)
BP 123/78   Pulse 68   Ht 5' (1.524 m)   Wt 214 lb (97.1 kg)   SpO2 98%   BMI 41.79 kg/m    Subjective:    Patient ID: Annette Porter, female    DOB: July 10, 1950, 66 y.o.   MRN: [id number]  HPI: Annette Porter is a 66 y.o. female  F/U BP, CAD/ lipids Patient follow-up blood pressure was unable to take Benzapril due to coughing and just felt bad discontinued started hydrochlorothiazide and patient doing well with no complaints. Blood pressures been doing well. In addition patient has been actively working on a diet and is lost 8 pounds in this short amount of time. Other medications doing well no chest pain chest tightness noted cholesterol issues or problems with medications. Relevant past medical, surgical, family and social history reviewed and updated as indicated. Interim medical history since our last visit reviewed. Allergies and medications reviewed and updated.  Review of Systems  Constitutional: Negative.   Respiratory: Negative.   Cardiovascular: Negative.     Per HPI unless specifically indicated above     Objective:    BP 123/78   Pulse 68   Ht 5' (1.524 m)   Wt 214 lb (97.1 kg)   SpO2 98%   BMI 41.79 kg/m   Wt Readings from Last 3 Encounters:  08/28/16 214 lb (97.1 kg)  07/17/16 222 lb 3.2 oz (100.8 kg)  05/27/16 220 lb 9.6 oz (100.1 kg)    Physical Exam  Constitutional: She is oriented to person, place, and time. She appears well-developed and well-nourished.  HENT:  Head: Normocephalic and atraumatic.  Eyes: Conjunctivae and EOM are normal.  Neck: Normal range of motion.  Cardiovascular: Normal rate, regular rhythm and normal heart sounds.   Pulmonary/Chest: Effort normal and breath sounds normal.  Musculoskeletal: Normal range of motion.  Neurological: She is alert and oriented to person, place, and time.  Skin: No erythema.  Psychiatric: She has a normal mood and affect. Her behavior is normal. Judgment and thought content normal.     Results for orders placed or performed in visit on 07/17/16  LP+ALT+AST Piccolo, [employer]  Result Value Ref Range   ALT (SGPT) Piccolo, Waived 26 10 - 47 U/L   AST (SGOT) Piccolo, Waived 25 11 - 38 U/L   Cholesterol Piccolo, Waived 148 <200 mg/dL   HDL Chol Piccolo, Waived 48 (L) >59 mg/dL   Triglycerides Piccolo,Waived 188 (H) <150 mg/dL   Chol/HDL Ratio Piccolo,Waive 3.1 mg/dL   LDL Chol Calc Piccolo Waived 62 <100 mg/dL   VLDL Chol Calc Piccolo,Waive 38 (H) <30 mg/dL  Basic Metabolic Panel (BMET)  Result Value Ref Range   Glucose 97 65 - 99 mg/dL   BUN 11 8 - 27 mg/dL   Creatinine, Ser 0.60 0.57 - 1.00 mg/dL   GFR calc non Af Amer 95 >59 mL/min/1.73   GFR calc Af Amer 110 >59 mL/min/1.73   BUN/Creatinine Ratio 18 12 - 28   Sodium 143 134 - 144 mmol/L   Potassium 4.6 3.5 - 5.2 mmol/L   Chloride 104 96 - 106 mmol/L   CO2 22 18 - 29 mmol/L   Calcium 9.4 8.7 - 10.3 mg/dL      Assessment & Plan:   Problem List Items Addressed This Visit      Cardiovascular and Mediastinum   Hypertension - Primary    The current medical regimen is effective;  continue present plan and medications.  Relevant Medications   hydrochlorothiazide (HYDRODIURIL) 25 MG tablet   Other Relevant Orders   Basic metabolic panel   CAD (coronary artery disease)    The current medical regimen is effective;  continue present plan and medications.       Relevant Medications   hydrochlorothiazide (HYDRODIURIL) 25 MG tablet     Other   Hyperlipidemia    The current medical regimen is effective;  continue present plan and medications.       Relevant Medications   hydrochlorothiazide (HYDRODIURIL) 25 MG tablet   Other Relevant Orders   Basic metabolic panel       Follow up plan: Return for Aug PE.

## 2016-08-29 ENCOUNTER — Encounter: Payer: Self-pay | Admitting: Family Medicine

## 2016-08-29 LAB — BASIC METABOLIC PANEL
BUN/Creatinine Ratio: 18 (ref 12–28)
BUN: 15 mg/dL (ref 8–27)
CO2: 24 mmol/L (ref 18–29)
CREATININE: 0.84 mg/dL (ref 0.57–1.00)
Calcium: 9.8 mg/dL (ref 8.7–10.3)
Chloride: 96 mmol/L (ref 96–106)
GFR calc non Af Amer: 73 mL/min/{1.73_m2} (ref >59)
GFR, EST AFRICAN AMERICAN: 84 mL/min/{1.73_m2} (ref >59)
GLUCOSE: 100 mg/dL — AB (ref 65–99)
POTASSIUM: 4 mmol/L (ref 3.5–5.2)
Sodium: 136 mmol/L (ref 134–144)

## 2016-09-16 ENCOUNTER — Encounter: Payer: Self-pay | Admitting: Radiation Oncology

## 2016-09-16 ENCOUNTER — Ambulatory Visit
Admission: RE | Admit: 2016-09-16 | Discharge: 2016-09-16 | Disposition: A | Discharge status: Home / Self Care | Payer: Medicare Other | Source: Ambulatory Visit | Attending: Radiation Oncology | Admitting: Radiation Oncology

## 2016-09-16 VITALS — BP 127/82 | HR 65 | Temp 96.9°F | Resp 18 | Wt 212.9 lb

## 2016-09-16 DIAGNOSIS — E782 Mixed hyperlipidemia: Secondary | ICD-10-CM | POA: Diagnosis not present

## 2016-09-16 DIAGNOSIS — Z923 Personal history of irradiation: Secondary | ICD-10-CM | POA: Insufficient documentation

## 2016-09-16 DIAGNOSIS — Z79811 Long term (current) use of aromatase inhibitors: Secondary | ICD-10-CM | POA: Diagnosis not present

## 2016-09-16 DIAGNOSIS — D0512 Intraductal carcinoma in situ of left breast: Secondary | ICD-10-CM | POA: Insufficient documentation

## 2016-09-16 DIAGNOSIS — I25118 Atherosclerotic heart disease of native coronary artery with other forms of angina pectoris: Secondary | ICD-10-CM | POA: Diagnosis not present

## 2016-09-16 DIAGNOSIS — Z6841 Body Mass Index (BMI) 40.0 and over, adult: Secondary | ICD-10-CM | POA: Diagnosis not present

## 2016-09-16 DIAGNOSIS — C50312 Malignant neoplasm of lower-inner quadrant of left female breast: Secondary | ICD-10-CM

## 2016-09-16 DIAGNOSIS — E119 Type 2 diabetes mellitus without complications: Secondary | ICD-10-CM | POA: Diagnosis not present

## 2016-09-16 DIAGNOSIS — Z170 Estrogen receptor positive status [ER+]: Secondary | ICD-10-CM | POA: Insufficient documentation

## 2016-09-16 NOTE — Progress Notes (Signed)
Radiation Oncology Follow up Note  Name: Annette Porter   Date:   09/16/2016 MRN:  [id number] DOB: 1950/10/16    This 66 y.o. 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 She is seen today in routine follow-up and is doing well. She had a small pimple on her left breast which is drained and healed well has nothing to do with her prior radiation or treatments. She specifically denies breast tenderness cough or bone pain. Currently she is on letrozole tolerating that well without side effect. Mammograms back in October 2017 were BI-RADS 2 benign  COMPLICATIONS OF TREATMENT: none  FOLLOW UP COMPLIANCE: keeps appointments   PHYSICAL EXAM:  BP 127/82   Pulse 65   Temp (!) 96.9 F (36.1 C)   Resp 18   Wt 212 lb 13.7 oz (96.6 kg)   BMI 41.57 kg/m  Lungs are clear to A&P cardiac examination essentially unremarkable with regular rate and rhythm. No dominant mass or nodularity is noted in either breast in 2 positions examined. Incision is well-healed. No axillary or supraclavicular adenopathy is appreciated. Cosmetic result is excellent. Well-developed well-nourished patient in NAD. HEENT reveals PERLA, EOMI, discs not visualized.  Oral cavity is clear. No oral mucosal lesions are identified. Neck is clear without evidence of cervical or supraclavicular adenopathy. Lungs are clear to A&P. Cardiac examination is essentially unremarkable with regular rate and rhythm without murmur rub or thrill. Abdomen is benign with no organomegaly or masses noted. Motor sensory and DTR levels are equal and  symmetric in the upper and lower extremities. Cranial nerves II through XII are grossly intact. Proprioception is intact. No peripheral adenopathy or edema is identified. No motor or sensory levels are noted. Crude visual fields are within normal range.  RADIOLOGY RESULTS: Mammograms are reviewed and compatible with the above-stated findings  PLAN: Present time patient is doing well with no evidence of disease. I'm please were overall progress. I've asked to see her back in 1 year for follow-up. She continues on letrozole without side effect. She is re: scheduled for follow-up mammograms. Patient knows to call with any concerns.  I would like to take this opportunity to thank you for allowing me to participate in the care of your patient.Armstead Peaks., MD

## 2016-10-24 ENCOUNTER — Other Ambulatory Visit: Payer: Self-pay | Admitting: Family Medicine

## 2016-10-24 NOTE — Telephone Encounter (Signed)
Patient needs her Ibuprofen script sent to [facility] .  She thought Dr Jeananne Rama had taken care of it but said it must have slipped through the cracks.    Thanks

## 2016-10-25 MED ORDER — IBUPROFEN 800 MG PO TABS: 800.0000 mg | ORAL_TABLET | Freq: Three times a day (TID) | ORAL | 0 refills | Status: DC | PRN

## 2016-10-25 NOTE — Telephone Encounter (Signed)
Ibuprofen is T'd up if appropriate. Please advise.

## 2016-12-09 ENCOUNTER — Other Ambulatory Visit: Payer: Self-pay | Admitting: Family Medicine

## 2016-12-16 ENCOUNTER — Other Ambulatory Visit: Payer: Self-pay | Admitting: Family Medicine

## 2016-12-16 DIAGNOSIS — I2583 Coronary atherosclerosis due to lipid rich plaque: Secondary | ICD-10-CM

## 2016-12-16 DIAGNOSIS — E785 Hyperlipidemia, unspecified: Secondary | ICD-10-CM

## 2016-12-16 DIAGNOSIS — I2510 Atherosclerotic heart disease of native coronary artery without angina pectoris: Secondary | ICD-10-CM

## 2016-12-16 NOTE — Telephone Encounter (Signed)
Last OV: 08/28/16 Next OV: 01/22/17  Lab Results  Component Value Date   CHOL 148 07/17/2016   HDL 50 01/10/2016   LDLCALC 78 01/10/2016   TRIG 188 (H) 07/17/2016   CHOLHDL 2.4 12/21/2014   Lab Results  Component Value Date   CREATININE 0.84 08/28/2016   BUN 15 08/28/2016   NA 136 08/28/2016   K 4.0 08/28/2016   CL 96 08/28/2016   CO2 24 08/28/2016     BMP Latest Ref Rng & Units 08/28/2016 07/17/2016 01/10/2016  Glucose 65 - 99 mg/dL 100(H) 97 94  BUN 8 - 27 mg/dL 15 11 16   Creatinine 0.57 - 1.00 mg/dL 0.84 0.60 0.68  BUN/Creat Ratio 12 - 28 18 18 24   Sodium 134 - 144 mmol/L 136 143 139  Potassium 3.5 - 5.2 mmol/L 4.0 4.6 4.4  Chloride 96 - 106 mmol/L 96 104 101  CO2 18 - 29 mmol/L 24 22 23   Calcium 8.7 - 10.3 mg/dL 9.8 9.4 9.6

## 2017-01-22 ENCOUNTER — Ambulatory Visit (INDEPENDENT_AMBULATORY_CARE_PROVIDER_SITE_OTHER): Payer: Medicare Other

## 2017-01-22 VITALS — BP 127/76 | HR 63 | Temp 97.6°F | Resp 15 | Ht 62.0 in | Wt 200.7 lb

## 2017-01-22 DIAGNOSIS — E785 Hyperlipidemia, unspecified: Secondary | ICD-10-CM

## 2017-01-22 DIAGNOSIS — Z0000 Encounter for general adult medical examination without abnormal findings: Secondary | ICD-10-CM

## 2017-01-22 DIAGNOSIS — Z1329 Encounter for screening for other suspected endocrine disorder: Secondary | ICD-10-CM

## 2017-01-22 DIAGNOSIS — I10 Essential (primary) hypertension: Secondary | ICD-10-CM | POA: Diagnosis not present

## 2017-01-22 DIAGNOSIS — Z1231 Encounter for screening mammogram for malignant neoplasm of breast: Secondary | ICD-10-CM | POA: Diagnosis not present

## 2017-01-22 DIAGNOSIS — Z1239 Encounter for other screening for malignant neoplasm of breast: Secondary | ICD-10-CM

## 2017-01-22 DIAGNOSIS — Z131 Encounter for screening for diabetes mellitus: Secondary | ICD-10-CM | POA: Diagnosis not present

## 2017-01-22 DIAGNOSIS — Z1159 Encounter for screening for other viral diseases: Secondary | ICD-10-CM

## 2017-01-22 NOTE — Patient Instructions (Signed)
Annette Porter , Thank you for taking time to come for your Medicare Wellness Visit. I appreciate your ongoing commitment to your health goals. Please review the following plan we discussed and let me know if I can assist you in the future.   Screening recommendations/referrals: Colonoscopy: Completed 05/31/2015 Mammogram: Completed 03/27/2016 Bone Density: Completed 06/08/2014 Recommended yearly ophthalmology/optometry visit for glaucoma screening and checkup Recommended yearly dental visit for hygiene and checkup  Vaccinations: Influenza vaccine: up to date, due 03/2017 Pneumococcal vaccine: up to date Tdap vaccine: up to date Shingles vaccine: due, check with your insurance company for coverage  Advanced directives: Advance directive discussed with you today. I have provided a copy for you to complete at home and have notarized. Once this is complete please bring a copy in to our office so we can scan it into your chart.  Conditions/risks identified: none  Next appointment: Follow up on 01/29/2017 at 9:00am with Dr.Crissman Follow up in one year for your annual wellness exam.   Preventive Care 65 Years and Older, Female Preventive care refers to lifestyle choices and visits with your health care provider that can promote health and wellness. What does preventive care include?  A yearly physical exam. This is also called an annual well check.  Dental exams once or twice a year.  Routine eye exams. Ask your health care provider how often you should have your eyes checked.  Personal lifestyle choices, including:  Daily care of your teeth and gums.  Regular physical activity.  Eating a healthy diet.  Avoiding tobacco and drug use.  Limiting alcohol use.  Practicing safe sex.  Taking low-dose aspirin every day.  Taking vitamin and mineral supplements as recommended by your health care provider. What happens during an annual well check? The services and screenings done by  your health care provider during your annual well check will depend on your age, overall health, lifestyle risk factors, and family history of disease. Counseling  Your health care provider may ask you questions about your:  Alcohol use.  Tobacco use.  Drug use.  Emotional well-being.  Home and relationship well-being.  Sexual activity.  Eating habits.  History of falls.  Memory and ability to understand (cognition).  Work and work Statistician.  Reproductive health. Screening  You may have the following tests or measurements:  Height, weight, and BMI.  Blood pressure.  Lipid and cholesterol levels. These may be checked every 5 years, or more frequently if you are over 28 years old.  Skin check.  Lung cancer screening. You may have this screening every year starting at age 31 if you have a 30-pack-year history of smoking and currently smoke or have quit within the past 15 years.  Fecal occult blood test (FOBT) of the stool. You may have this test every year starting at age 20.  Flexible sigmoidoscopy or colonoscopy. You may have a sigmoidoscopy every 5 years or a colonoscopy every 10 years starting at age 14.  Hepatitis C blood test.  Hepatitis B blood test.  Sexually transmitted disease (STD) testing.  Diabetes screening. This is done by checking your blood sugar (glucose) after you have not eaten for a while (fasting). You may have this done every 1-3 years.  Bone density scan. This is done to screen for osteoporosis. You may have this done starting at age 85.  Mammogram. This may be done every 1-2 years. Talk to your health care provider about how often you should have regular mammograms. Talk with your  health care provider about your test results, treatment options, and if necessary, the need for more tests. Vaccines  Your health care provider may recommend certain vaccines, such as:  Influenza vaccine. This is recommended every year.  Tetanus,  diphtheria, and acellular pertussis (Tdap, Td) vaccine. You may need a Td booster every 10 years.  Zoster vaccine. You may need this after age 58.  Pneumococcal 13-valent conjugate (PCV13) vaccine. One dose is recommended after age 55.  Pneumococcal polysaccharide (PPSV23) vaccine. One dose is recommended after age 17. Talk to your health care provider about which screenings and vaccines you need and how often you need them. This information is not intended to replace advice given to you by your health care provider. Make sure you discuss any questions you have with your health care provider. Document Released: 06/23/2015 Document Revised: 02/14/2016 Document Reviewed: 03/28/2015 Elsevier Interactive Patient Education  2017 [place] Prevention in the Home Falls can cause injuries. They can happen to people of all ages. There are many things you can do to make your home safe and to help prevent falls. What can I do on the outside of my home?  Regularly fix the edges of walkways and driveways and fix any cracks.  Remove anything that might make you trip as you walk through a door, such as a raised step or threshold.  Trim any bushes or trees on the path to your home.  Use bright outdoor lighting.  Clear any walking paths of anything that might make someone trip, such as rocks or tools.  Regularly check to see if handrails are loose or broken. Make sure that both sides of any steps have handrails.  Any raised decks and porches should have guardrails on the edges.  Have any leaves, snow, or ice cleared regularly.  Use sand or salt on walking paths during winter.  Clean up any spills in your garage right away. This includes oil or grease spills. What can I do in the bathroom?  Use night lights.  Install grab bars by the toilet and in the tub and shower. Do not use towel bars as grab bars.  Use non-skid mats or decals in the tub or shower.  If you need to sit down in  the shower, use a plastic, non-slip stool.  Keep the floor dry. Clean up any water that spills on the floor as soon as it happens.  Remove soap buildup in the tub or shower regularly.  Attach bath mats securely with double-sided non-slip rug tape.  Do not have throw rugs and other things on the floor that can make you trip. What can I do in the bedroom?  Use night lights.  Make sure that you have a light by your bed that is easy to reach.  Do not use any sheets or blankets that are too big for your bed. They should not hang down onto the floor.  Have a firm chair that has side arms. You can use this for support while you get dressed.  Do not have throw rugs and other things on the floor that can make you trip. What can I do in the kitchen?  Clean up any spills right away.  Avoid walking on wet floors.  Keep items that you use a lot in easy-to-reach places.  If you need to reach something above you, use a strong step stool that has a grab bar.  Keep electrical cords out of the way.  Do not use  floor polish or wax that makes floors slippery. If you must use wax, use non-skid floor wax.  Do not have throw rugs and other things on the floor that can make you trip. What can I do with my stairs?  Do not leave any items on the stairs.  Make sure that there are handrails on both sides of the stairs and use them. Fix handrails that are broken or loose. Make sure that handrails are as long as the stairways.  Check any carpeting to make sure that it is firmly attached to the stairs. Fix any carpet that is loose or worn.  Avoid having throw rugs at the top or bottom of the stairs. If you do have throw rugs, attach them to the floor with carpet tape.  Make sure that you have a light switch at the top of the stairs and the bottom of the stairs. If you do not have them, ask someone to add them for you. What else can I do to help prevent falls?  Wear shoes that:  Do not have high  heels.  Have rubber bottoms.  Are comfortable and fit you well.  Are closed at the toe. Do not wear sandals.  If you use a stepladder:  Make sure that it is fully opened. Do not climb a closed stepladder.  Make sure that both sides of the stepladder are locked into place.  Ask someone to hold it for you, if possible.  Clearly mark and make sure that you can see:  Any grab bars or handrails.  First and last steps.  Where the edge of each step is.  Use tools that help you move around (mobility aids) if they are needed. These include:  Canes.  Walkers.  Scooters.  Crutches.  Turn on the lights when you go into a dark area. Replace any light bulbs as soon as they burn out.  Set up your furniture so you have a clear path. Avoid moving your furniture around.  If any of your floors are uneven, fix them.  If there are any pets around you, be aware of where they are.  Review your medicines with your doctor. Some medicines can make you feel dizzy. This can increase your chance of falling. Ask your doctor what other things that you can do to help prevent falls. This information is not intended to replace advice given to you by your health care provider. Make sure you discuss any questions you have with your health care provider. Document Released: 03/23/2009 Document Revised: 11/02/2015 Document Reviewed: 07/01/2014 Elsevier Interactive Patient Education  2017 [employer].

## 2017-01-22 NOTE — Progress Notes (Signed)
Subjective:   Annette Porter is a 66 y.o. female who presents for Medicare Annual (Subsequent) preventive examination.  Review of Systems:  Cardiac Risk Factors include: advanced age (>96men, >65 women);obesity (BMI >30kg/m2);hypertension;dyslipidemia     Objective:     Vitals: BP 127/76 (BP Location: Right Arm, Patient Position: Sitting)   Pulse 63   Temp 97.6 F (36.4 C)   Resp 15   Ht 5\' 2"  (1.575 m)   Wt 200 lb 11.2 oz (91 kg)   BMI 36.71 kg/m   Body mass index is 36.71 kg/m.   Tobacco History  Smoking Status  . Never Smoker  Smokeless Tobacco  . Never Used     Counseling given: Not Answered   Past Medical History:  Diagnosis Date  . CAD (coronary artery disease)   . GERD (gastroesophageal reflux disease)   . Hyperlipidemia   . Hypertension   . Hypothyroidism   . Morbid obesity ([place])   . Myocardial infarction [facility]) 07/1998   Past Surgical History:  Procedure Laterality Date  . BREAST BIOPSY Left 03/22/13    stereo  . 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 BREAST LUMPECTOMY WITH MAMMOSITE CAVITY EVALUATION DEVICE Left 2014  . BREAST LUMPECTOMY WITH MAMMOSITE CAVITY EVALUATION DEVICE Left 2015  . BREAST MAMMOSITE  05/20/13  . BREAST SURGERY Left 04/29/13   wide excision  . BREAST SURGERY Left 12-16-13   Wide excision with SN biopsy, INVASIVE MAMMARY CARCINOMA  . CARDIAC CATHETERIZATION    . COLONOSCOPY  2006  . COLONOSCOPY WITH PROPOFOL N/A 05/31/2015   Procedure: COLONOSCOPY WITH PROPOFOL;  Surgeon: Robert Bellow, MD;  Location[facility] ENDOSCOPY;  Service: Endoscopy;  Laterality: N/A;  . HAND SURGERY    . KNEE ARTHROSCOPY WITH MEDIAL MENISECTOMY Left 02/24/2015   Procedure: KNEE ARTHROSCOPY WITH PARTIAL MEDIAL MENISECTOMY;  Surgeon: Christophe Louis, MD;  Location: [facility] ORS;  Service: Orthopedics;  Laterality: Left;   Family History  Problem Relation Age of Onset  . Brain cancer Father   . Thyroid disease Sister   .  Diabetes Maternal Uncle   . Thyroid disease Paternal Aunt   . Thyroid disease Sister   . Thyroid disease Sister   . Breast cancer Neg Hx    History  Sexual Activity  . Sexual activity: Not on file    Outpatient Encounter Prescriptions as of 01/22/2017  Medication Sig  . aspirin 81 MG tablet Take 81 mg by mouth daily.  Marland Kitchen atorvastatin (LIPITOR) 40 MG tablet TAKE 1 TABLET BY MOUTH  DAILY  . Calcium Carbonate (CALCIUM 600 PO) Take 1 tablet by mouth 2 (two) times daily.  . Cholecalciferol (VITAMIN D3) 1000 UNITS CAPS Take 1 capsule by mouth daily.  Marland Kitchen ezetimibe (ZETIA) 10 MG tablet TAKE 1 TABLET BY MOUTH  DAILY  . hydrochlorothiazide (HYDRODIURIL) 25 MG tablet Take 1 tablet (25 mg total) by mouth daily.  Marland Kitchen ibuprofen (ADVIL,MOTRIN) 800 MG tablet Take 1 tablet (800 mg total) by mouth 3 (three) times daily as needed.  . isosorbide mononitrate (IMDUR) 30 MG 24 hr tablet Take 30 mg by mouth daily.  . isosorbide mononitrate (IMDUR) 60 MG 24 hr tablet Take 1 tablet by mouth daily.  Marland Kitchen letrozole (FEMARA) 2.5 MG tablet TAKE ONE TABLET BY MOUTH ONCE DAILY  . levothyroxine (SYNTHROID, LEVOTHROID) 125 MCG tablet TAKE 1 TABLET BY MOUTH  DAILY  . metoprolol succinate (TOPROL-XL) 50 MG 24 hr tablet Take 1 tablet by mouth daily.  Marland Kitchen  ranitidine (ZANTAC) 150 MG tablet Take 150 mg by mouth at bedtime.   . vitamin B-12 (CYANOCOBALAMIN) 250 MCG tablet Take 500 mcg by mouth daily.   . [DISCONTINUED] IBU 800 MG tablet TAKE 1 TABLET BY MOUTH 3  TIMES DAILY AS NEEDED  . nitroGLYCERIN (NITROSTAT) 0.4 MG SL tablet Place 0.4 mg under the tongue every 5 (five) minutes as needed for chest pain.   No facility-administered encounter medications on file as of 01/22/2017.     Activities of Daily Living In your present state of health, do you have any difficulty performing the following activities: 01/22/2017  Hearing? N  Vision? N  Difficulty concentrating or making decisions? N  Walking or climbing stairs? N  Dressing or  bathing? N  Doing errands, shopping? N  Preparing Food and eating ? N  Using the Toilet? N  In the past six months, have you accidently leaked urine? N  Do you have problems with loss of bowel control? N  Managing your Medications? N  Managing your Finances? N  Housekeeping or managing your Housekeeping? N  Some recent data might be hidden    Patient Care Team: Guadalupe Maple, MD as PCP - General (Family Medicine) Bary Castilla, Forest Gleason, MD (General Surgery) Guadalupe Maple, MD (Family Medicine) Noreene Filbert, MD as Referring Physician (Radiation Oncology) Lollie Sails, MD as Consulting Physician (Gastroenterology) Teodoro Spray, MD as Consulting Physician (Cardiology)    Assessment:     Exercise Activities and Dietary recommendations Current Exercise Habits: Home exercise routine, Type of exercise: walking, Time (Minutes): 30, Frequency (Times/Week): 1, Weekly Exercise (Minutes/Week): 30, Intensity: Mild, Exercise limited by: None identified  Goals    None     Fall Risk Fall Risk  01/22/2017 09/16/2016 08/28/2016 01/10/2016  Falls in the past year? No No No No   Depression Screen PHQ 2/9 Scores 01/22/2017 09/16/2016 01/10/2016  PHQ - 2 Score 0 0 0     Cognitive Function     6CIT Screen 01/22/2017  What Year? 0 points  What month? 0 points  What time? 0 points  Count back from 20 0 points  Months in reverse 0 points  Repeat phrase 0 points  Total Score 0    Immunization History  Administered Date(s) Administered  . Influenza, High Dose Seasonal PF 03/19/2016  . Influenza,inj,Quad PF,36+ Mos 03/22/2015  . Pneumococcal Conjugate-13 12/21/2014  . Pneumococcal-Unspecified 04/12/2004, 06/26/2009  . Td 04/29/2005, 12/21/2014   Screening Tests Health Maintenance  Topic Date Due  . INFLUENZA VACCINE  01/08/2017  . MAMMOGRAM  03/27/2018  . TETANUS/TDAP  12/20/2024  . COLONOSCOPY  05/30/2025  . DEXA SCAN  Completed  . Hepatitis C Screening  Completed  . PNA vac  Low Risk Adult  Completed      Plan:     I have personally reviewed and addressed the Medicare Annual Wellness questionnaire and have noted the following in the patient's chart:  A. Medical and social history B. Use of alcohol, tobacco or illicit drugs  C. Current medications and supplements D. Functional ability and status E.  Nutritional status F.  Physical activity G. Advance directives H. List of other physicians I.  Hospitalizations, surgeries, and ER visits in previous 12 months J.  [place] such as hearing and vision if needed, cognitive and depression L. Referrals and appointments  In addition, I have reviewed and discussed with patient certain preventive protocols, quality metrics, and best practice recommendations. A written personalized care plan for  preventive services as well as general preventive health recommendations were provided to patient.   Signed,  Tyler Aas, LPN Nurse Health Advisor   MD Recommendations:none

## 2017-01-23 LAB — COMPREHENSIVE METABOLIC PANEL
A/G RATIO: 1.6 (ref 1.2–2.2)
ALT: 17 IU/L (ref 0–32)
AST: 19 IU/L (ref 0–40)
Albumin: 4 g/dL (ref 3.6–4.8)
Alkaline Phosphatase: 84 IU/L (ref 39–117)
BUN/Creatinine Ratio: 20 (ref 12–28)
BUN: 16 mg/dL (ref 8–27)
Bilirubin Total: 0.2 mg/dL (ref 0.0–1.2)
CALCIUM: 9.6 mg/dL (ref 8.7–10.3)
CO2: 24 mmol/L (ref 20–29)
Chloride: 101 mmol/L (ref 96–106)
Creatinine, Ser: 0.8 mg/dL (ref 0.57–1.00)
GFR calc Af Amer: 88 mL/min/{1.73_m2} (ref >59)
GFR, EST NON AFRICAN AMERICAN: 77 mL/min/{1.73_m2} (ref >59)
GLUCOSE: 96 mg/dL (ref 65–99)
Globulin, Total: 2.5 g/dL (ref 1.5–4.5)
POTASSIUM: 4.2 mmol/L (ref 3.5–5.2)
Sodium: 140 mmol/L (ref 134–144)
TOTAL PROTEIN: 6.5 g/dL (ref 6.0–8.5)

## 2017-01-23 LAB — CBC
Hematocrit: 40.3 % (ref 34.0–46.6)
Hemoglobin: 13.4 g/dL (ref 11.1–15.9)
MCH: 28.9 pg (ref 26.6–33.0)
MCHC: 33.3 g/dL (ref 31.5–35.7)
MCV: 87 fL (ref 79–97)
PLATELETS: 284 10*3/uL (ref 150–379)
RBC: 4.63 x10E6/uL (ref 3.77–5.28)
RDW: 13.7 % (ref 12.3–15.4)
WBC: 8 10*3/uL (ref 3.4–10.8)

## 2017-01-23 LAB — URINALYSIS, ROUTINE W REFLEX MICROSCOPIC
BILIRUBIN UA: NEGATIVE
Glucose, UA: NEGATIVE
Nitrite, UA: NEGATIVE
RBC UA: NEGATIVE
SPEC GRAV UA: 1.015 (ref 1.005–1.030)
UUROB: 1 mg/dL (ref 0.2–1.0)
pH, UA: 7 (ref 5.0–7.5)

## 2017-01-23 LAB — MICROSCOPIC EXAMINATION
Bacteria, UA: NONE SEEN
RBC MICROSCOPIC, UA: NONE SEEN /HPF (ref 0–?)

## 2017-01-23 LAB — LIPID PANEL
CHOL/HDL RATIO: 3.3 ratio (ref 0.0–4.4)
Cholesterol, Total: 132 mg/dL (ref 100–199)
HDL: 40 mg/dL (ref >39)
LDL CALC: 65 mg/dL (ref 0–99)
Triglycerides: 135 mg/dL (ref 0–149)
VLDL CHOLESTEROL CAL: 27 mg/dL (ref 5–40)

## 2017-01-23 LAB — HEPATITIS C ANTIBODY: Hep C Virus Ab: <0.1 s/co ratio (ref 0.0–0.9)

## 2017-01-23 LAB — TSH: TSH: 0.147 u[IU]/mL — ABNORMAL LOW (ref 0.450–4.500)

## 2017-01-29 ENCOUNTER — Ambulatory Visit (INDEPENDENT_AMBULATORY_CARE_PROVIDER_SITE_OTHER): Payer: Medicare Other | Admitting: Family Medicine

## 2017-01-29 ENCOUNTER — Encounter: Payer: Self-pay | Admitting: Family Medicine

## 2017-01-29 VITALS — BP 117/75 | HR 61 | Wt 201.0 lb

## 2017-01-29 DIAGNOSIS — Z170 Estrogen receptor positive status [ER+]: Secondary | ICD-10-CM

## 2017-01-29 DIAGNOSIS — Z131 Encounter for screening for diabetes mellitus: Secondary | ICD-10-CM | POA: Diagnosis not present

## 2017-01-29 DIAGNOSIS — C50912 Malignant neoplasm of unspecified site of left female breast: Secondary | ICD-10-CM

## 2017-01-29 DIAGNOSIS — K219 Gastro-esophageal reflux disease without esophagitis: Secondary | ICD-10-CM

## 2017-01-29 DIAGNOSIS — Z1159 Encounter for screening for other viral diseases: Secondary | ICD-10-CM

## 2017-01-29 DIAGNOSIS — E785 Hyperlipidemia, unspecified: Secondary | ICD-10-CM

## 2017-01-29 DIAGNOSIS — Z1231 Encounter for screening mammogram for malignant neoplasm of breast: Secondary | ICD-10-CM | POA: Diagnosis not present

## 2017-01-29 DIAGNOSIS — Z7189 Other specified counseling: Secondary | ICD-10-CM | POA: Insufficient documentation

## 2017-01-29 DIAGNOSIS — I2583 Coronary atherosclerosis due to lipid rich plaque: Secondary | ICD-10-CM | POA: Diagnosis not present

## 2017-01-29 DIAGNOSIS — I2510 Atherosclerotic heart disease of native coronary artery without angina pectoris: Secondary | ICD-10-CM

## 2017-01-29 DIAGNOSIS — I10 Essential (primary) hypertension: Secondary | ICD-10-CM | POA: Diagnosis not present

## 2017-01-29 DIAGNOSIS — Z1329 Encounter for screening for other suspected endocrine disorder: Secondary | ICD-10-CM | POA: Diagnosis not present

## 2017-01-29 DIAGNOSIS — Z1239 Encounter for other screening for malignant neoplasm of breast: Secondary | ICD-10-CM

## 2017-01-29 DIAGNOSIS — Z0000 Encounter for general adult medical examination without abnormal findings: Secondary | ICD-10-CM

## 2017-01-29 DIAGNOSIS — Z853 Personal history of malignant neoplasm of breast: Secondary | ICD-10-CM

## 2017-01-29 DIAGNOSIS — E039 Hypothyroidism, unspecified: Secondary | ICD-10-CM | POA: Diagnosis not present

## 2017-01-29 MED ORDER — EZETIMIBE 10 MG PO TABS: 10.0000 mg | ORAL_TABLET | Freq: Every day | ORAL | 4 refills | Status: DC

## 2017-01-29 MED ORDER — IBUPROFEN 800 MG PO TABS: 800.0000 mg | ORAL_TABLET | Freq: Three times a day (TID) | ORAL | 0 refills | Status: DC | PRN

## 2017-01-29 MED ORDER — METOPROLOL SUCCINATE ER 50 MG PO TB24: 50.0000 mg | ORAL_TABLET | Freq: Every day | ORAL | 4 refills | Status: DC

## 2017-01-29 MED ORDER — ATORVASTATIN CALCIUM 40 MG PO TABS: 40.0000 mg | ORAL_TABLET | Freq: Every day | ORAL | 4 refills | Status: DC

## 2017-01-29 MED ORDER — HYDROCHLOROTHIAZIDE 25 MG PO TABS: 25.0000 mg | ORAL_TABLET | Freq: Every day | ORAL | 4 refills | Status: DC

## 2017-01-29 NOTE — Assessment & Plan Note (Signed)
The current medical regimen is effective;  continue present plan and medications.  

## 2017-01-29 NOTE — Assessment & Plan Note (Signed)
A voluntary discussion about advance care planning including the explanation and discussion of advance directives was extensively discussed  with the patient.  Explanation about the health care proxy and Living will was reviewed and packet with forms with explanation of how to fill them out was given.  PT WILL BRING COPIES

## 2017-01-29 NOTE — Addendum Note (Signed)
Addended by: Gerda Diss A on: 01/29/2017 09:46 AM   Modules accepted: Orders

## 2017-01-29 NOTE — Assessment & Plan Note (Signed)
Doing well taking meds no problems

## 2017-01-29 NOTE — Assessment & Plan Note (Signed)
Discussed hypothyroid and Synthroid replacement medicine. Not sure if taking recalled medication patient will check with pharmacy. If recalled medicine will be on a different brand and will recheck TSH in 1-2 months. If not on a recalled medication will reduce dose and recheck TSH in 1-2 months.

## 2017-01-29 NOTE — Progress Notes (Signed)
BP 117/75   Pulse 61   Wt 201 lb (91.2 kg)   SpO2 99%   BMI 36.76 kg/m    Subjective:    Patient ID: Annette Porter, female    DOB: 04/21/1951, 66 y.o.   MRN: [id number]  HPI: Annette Porter is a 66 y.o. female  Chief Complaint  Patient presents with  . Annual Exam  Patient doing really well has lost over 27 pounds this year by diet and exercise. Medical issues doing well. Patient had blood work done earlier this month which was normal except for suppressed TSH. Patient's been taking generic levothyroxine sodium without problems and faithfully. Concerned about the recall on some of levothyroxine. Patient with no CAD symptoms. Blood pressure cholesterol doing well with no complaints. Taking suppressive therapy for breast cancer without problems or side effects. Knee arthritis is doing better.  Relevant past medical, surgical, family and social history reviewed and updated as indicated. Interim medical history since our last visit reviewed. Allergies and medications reviewed and updated.  Review of Systems  Constitutional: Negative.   HENT: Negative.   Eyes: Negative.   Respiratory: Negative.   Cardiovascular: Negative.   Gastrointestinal: Negative.   Endocrine: Negative.   Genitourinary: Negative.   Musculoskeletal: Negative.        Has some left trochanteric bursa versus changes of bursitis and some discomfort is taking ibuprofen  Skin: Negative.   Allergic/Immunologic: Negative.   Neurological: Negative.   Hematological: Negative.   Psychiatric/Behavioral: Negative.     Per HPI unless specifically indicated above     Objective:    BP 117/75   Pulse 61   Wt 201 lb (91.2 kg)   SpO2 99%   BMI 36.76 kg/m   Wt Readings from Last 3 Encounters:  01/29/17 201 lb (91.2 kg)  01/22/17 200 lb 11.2 oz (91 kg)  09/16/16 212 lb 13.7 oz (96.6 kg)    Physical Exam  Constitutional: She is oriented to person, place, and time. She appears well-developed and  well-nourished.  HENT:  Head: Normocephalic and atraumatic.  Right Ear: External ear normal.  Left Ear: External ear normal.  Nose: Nose normal.  Mouth/Throat: Oropharynx is clear and moist.  Eyes: Pupils are equal, round, and reactive to light. Conjunctivae and EOM are normal.  Neck: Normal range of motion. Neck supple. Carotid bruit is not present.  Cardiovascular: Normal rate, regular rhythm and normal heart sounds.   No murmur heard. Pulmonary/Chest: Effort normal and breath sounds normal. She exhibits no mass. Right breast exhibits no mass, no skin change and no tenderness. Left breast exhibits no mass, no skin change and no tenderness. Breasts are symmetrical.  Abdominal: Soft. Bowel sounds are normal. There is no hepatosplenomegaly.  Musculoskeletal: Normal range of motion.  Neurological: She is alert and oriented to person, place, and time.  Skin: No rash noted.  Psychiatric: She has a normal mood and affect. Her behavior is normal. Judgment and thought content normal.    Results for orders placed or performed in visit on 01/22/17  Microscopic Examination  Result Value Ref Range   WBC, UA 0-5 0 - 5 /hpf   RBC, UA None seen 0 - 2 /hpf   Epithelial Cells (non renal) 0-10 0 - 10 /hpf   Bacteria, UA None seen None seen/Few  Hepatitis C antibody screen  Result Value Ref Range   Hep C Virus Ab <0.1 0.0 - 0.9 s/co ratio  Lipid panel  Result Value Ref Range  Cholesterol, Total 132 100 - 199 mg/dL   Triglycerides 135 0 - 149 mg/dL   HDL 40 >39 mg/dL   VLDL Cholesterol Cal 27 5 - 40 mg/dL   LDL Calculated 65 0 - 99 mg/dL   Chol/HDL Ratio 3.3 0.0 - 4.4 ratio  CBC  Result Value Ref Range   WBC 8.0 3.4 - 10.8 x10E3/uL   RBC 4.63 3.77 - 5.28 x10E6/uL   Hemoglobin 13.4 11.1 - 15.9 g/dL   Hematocrit 40.3 34.0 - 46.6 %   MCV 87 79 - 97 fL   MCH 28.9 26.6 - 33.0 pg   MCHC 33.3 31.5 - 35.7 g/dL   RDW 13.7 12.3 - 15.4 %   Platelets 284 150 - 379 x10E3/uL  Comp Met (CMET)  Result  Value Ref Range   Glucose 96 65 - 99 mg/dL   BUN 16 8 - 27 mg/dL   Creatinine, Ser 0.80 0.57 - 1.00 mg/dL   GFR calc non Af Amer 77 >59 mL/min/1.73   GFR calc Af Amer 88 >59 mL/min/1.73   BUN/Creatinine Ratio 20 12 - 28   Sodium 140 134 - 144 mmol/L   Potassium 4.2 3.5 - 5.2 mmol/L   Chloride 101 96 - 106 mmol/L   CO2 24 20 - 29 mmol/L   Calcium 9.6 8.7 - 10.3 mg/dL   Total Protein 6.5 6.0 - 8.5 g/dL   Albumin 4.0 3.6 - 4.8 g/dL   Globulin, Total 2.5 1.5 - 4.5 g/dL   Albumin/Globulin Ratio 1.6 1.2 - 2.2   Bilirubin Total 0.2 0.0 - 1.2 mg/dL   Alkaline Phosphatase 84 39 - 117 IU/L   AST 19 0 - 40 IU/L   ALT 17 0 - 32 IU/L  TSH  Result Value Ref Range   TSH 0.147 (L) 0.450 - 4.500 uIU/mL  Urinalysis, Routine w reflex microscopic  Result Value Ref Range   Specific Gravity, UA 1.015 1.005 - 1.030   pH, UA 7.0 5.0 - 7.5   Color, UA Yellow Yellow   Appearance Ur Clear Clear   Leukocytes, UA Trace (A) Negative   Protein, UA Trace (A) Negative/Trace   Glucose, UA Negative Negative   Ketones, UA Trace (A) Negative   RBC, UA Negative Negative   Bilirubin, UA Negative Negative   Urobilinogen, Ur 1.0 0.2 - 1.0 mg/dL   Nitrite, UA Negative Negative   Microscopic Examination See below:       Assessment & Plan:   Problem List Items Addressed This Visit      Cardiovascular and Mediastinum   Hypertension - Primary    The current medical regimen is effective;  continue present plan and medications.       Relevant Medications   atorvastatin (LIPITOR) 40 MG tablet   ezetimibe (ZETIA) 10 MG tablet   hydrochlorothiazide (HYDRODIURIL) 25 MG tablet   metoprolol succinate (TOPROL-XL) 50 MG 24 hr tablet   CAD (coronary artery disease)    The current medical regimen is effective;  continue present plan and medications.       Relevant Medications   atorvastatin (LIPITOR) 40 MG tablet   ezetimibe (ZETIA) 10 MG tablet   hydrochlorothiazide (HYDRODIURIL) 25 MG tablet   metoprolol  succinate (TOPROL-XL) 50 MG 24 hr tablet     Digestive   GERD (gastroesophageal reflux disease)    The current medical regimen is effective;  continue present plan and medications.         Endocrine   Hypothyroidism  Discussed hypothyroid and Synthroid replacement medicine. Not sure if taking recalled medication patient will check with pharmacy. If recalled medicine will be on a different brand and will recheck TSH in 1-2 months. If not on a recalled medication will reduce dose and recheck TSH in 1-2 months.      Relevant Medications   metoprolol succinate (TOPROL-XL) 50 MG 24 hr tablet     Other   Breast cancer, stage 1, estrogen receptor positive ([place]); pT1c, N0; Wide excision, mammosite.     Doing well taking meds no problems      Hyperlipidemia   Relevant Medications   atorvastatin (LIPITOR) 40 MG tablet   ezetimibe (ZETIA) 10 MG tablet   hydrochlorothiazide (HYDRODIURIL) 25 MG tablet   metoprolol succinate (TOPROL-XL) 50 MG 24 hr tablet   Advanced care planning/counseling discussion    A voluntary discussion about advance care planning including the explanation and discussion of advance directives was extensively discussed  with the patient.  Explanation about the health care proxy and Living will was reviewed and packet with forms with explanation of how to fill them out was given.  PT WILL BRING COPIES        Other Visit Diagnoses    Annual physical exam       Need for hepatitis C screening test       Thyroid disorder screen       Screening for diabetes mellitus (DM)           Follow up plan: Return in about 6 months (around 08/01/2017) for BMP,  Lipids, ALT, AST.

## 2017-02-04 ENCOUNTER — Other Ambulatory Visit: Payer: Self-pay | Admitting: Family Medicine

## 2017-02-04 DIAGNOSIS — E039 Hypothyroidism, unspecified: Secondary | ICD-10-CM

## 2017-02-04 NOTE — Telephone Encounter (Signed)
Patient called regarding the levothyroxine she is taking about the recall on this med.  Dr Jeananne Rama had asked her to call [facility] regarding the recall.   Thank You

## 2017-02-05 MED ORDER — LEVOTHYROXINE SODIUM 112 MCG PO TABS: 112.0000 ug | ORAL_TABLET | Freq: Every day | ORAL | 2 refills | Status: DC

## 2017-02-05 NOTE — Telephone Encounter (Signed)
Patient aware. Would like to go ahead and get medication sent to [facility] so it is ready for her when she returns from vacation. Pt schedule lab visit so she would remember to have checked in 6 weeks.

## 2017-02-05 NOTE — Telephone Encounter (Signed)
As her thyroid was over treated and the recalled medicine shouldn't do that, I'm sending the lower dose to her local pharmacy. That way she doesn't have to wait for [facility] to send it. Please have her come in in 6 weeks to recheck her thyroid. Order in. Enjoy vacation!

## 2017-02-05 NOTE — Telephone Encounter (Signed)
Spoke with patient. She was advised to call [facility]  to see in Recalled Levothyroxine had effected her recent lab results.  She did call and [facility] researched it and called her back to say that the recalled medication would not have affected results. She stated Dr. Jeananne Rama told her if they said it did not effect he would be changing dose of medication. Pt also stated she is about to go out of town for a month and would like to know if she could continue current dose until she returns because she will not have time to get it from [place] before she goes.  Please advise.

## 2017-02-06 MED ORDER — LEVOTHYROXINE SODIUM 112 MCG PO TABS: 112.0000 ug | ORAL_TABLET | Freq: Every day | ORAL | 1 refills | Status: DC

## 2017-03-17 ENCOUNTER — Ambulatory Visit (INDEPENDENT_AMBULATORY_CARE_PROVIDER_SITE_OTHER): Payer: Medicare Other

## 2017-03-17 ENCOUNTER — Other Ambulatory Visit: Payer: Medicare Other

## 2017-03-17 DIAGNOSIS — E039 Hypothyroidism, unspecified: Secondary | ICD-10-CM

## 2017-03-17 DIAGNOSIS — Z23 Encounter for immunization: Secondary | ICD-10-CM | POA: Diagnosis not present

## 2017-03-18 ENCOUNTER — Telehealth: Payer: Self-pay | Admitting: Family Medicine

## 2017-03-18 DIAGNOSIS — E7801 Familial hypercholesterolemia: Secondary | ICD-10-CM | POA: Diagnosis not present

## 2017-03-18 DIAGNOSIS — I25118 Atherosclerotic heart disease of native coronary artery with other forms of angina pectoris: Secondary | ICD-10-CM | POA: Diagnosis not present

## 2017-03-18 LAB — TSH: TSH: 0.308 u[IU]/mL — ABNORMAL LOW (ref 0.450–4.500)

## 2017-03-18 MED ORDER — LEVOTHYROXINE SODIUM 100 MCG PO TABS: 100.0000 ug | ORAL_TABLET | Freq: Every day | ORAL | 2 refills | Status: DC

## 2017-03-18 NOTE — Telephone Encounter (Signed)
Phone call Patient having lab work drawn on a month ago 125 g then yesterday on 112 g which is indicating still too much medication will decrease to 100 g and recheck TSH 1 month.

## 2017-04-02 ENCOUNTER — Ambulatory Visit
Admission: RE | Admit: 2017-04-02 | Discharge: 2017-04-02 | Disposition: A | Discharge status: Home / Self Care | Payer: Medicare Other | Source: Ambulatory Visit | Attending: Family Medicine | Admitting: Family Medicine

## 2017-04-02 DIAGNOSIS — Z853 Personal history of malignant neoplasm of breast: Secondary | ICD-10-CM | POA: Diagnosis not present

## 2017-04-02 DIAGNOSIS — Z1231 Encounter for screening mammogram for malignant neoplasm of breast: Secondary | ICD-10-CM | POA: Insufficient documentation

## 2017-04-02 DIAGNOSIS — Z1239 Encounter for other screening for malignant neoplasm of breast: Secondary | ICD-10-CM

## 2017-04-02 DIAGNOSIS — R928 Other abnormal and inconclusive findings on diagnostic imaging of breast: Secondary | ICD-10-CM | POA: Diagnosis not present

## 2017-04-02 HISTORY — DX: Personal history of irradiation: Z92.3

## 2017-04-02 HISTORY — DX: Malignant neoplasm of unspecified site of unspecified female breast: C50.919

## 2017-04-07 ENCOUNTER — Ambulatory Visit (INDEPENDENT_AMBULATORY_CARE_PROVIDER_SITE_OTHER): Payer: Medicare Other | Admitting: General Surgery

## 2017-04-07 ENCOUNTER — Encounter: Payer: Self-pay | Admitting: General Surgery

## 2017-04-07 ENCOUNTER — Ambulatory Visit: Payer: Medicare Other | Admitting: General Surgery

## 2017-04-07 VITALS — BP 132/76 | HR 60 | Resp 14 | Ht 66.0 in | Wt 204.0 lb

## 2017-04-07 DIAGNOSIS — Z79811 Long term (current) use of aromatase inhibitors: Secondary | ICD-10-CM

## 2017-04-07 DIAGNOSIS — I2583 Coronary atherosclerosis due to lipid rich plaque: Secondary | ICD-10-CM | POA: Diagnosis not present

## 2017-04-07 DIAGNOSIS — C50312 Malignant neoplasm of lower-inner quadrant of left female breast: Secondary | ICD-10-CM | POA: Diagnosis not present

## 2017-04-07 DIAGNOSIS — I2510 Atherosclerotic heart disease of native coronary artery without angina pectoris: Secondary | ICD-10-CM

## 2017-04-07 NOTE — Patient Instructions (Signed)
The patient has been asked to return to the office in one year with a bilateral diagnostic mammogram.The patient is aware to call back for any questions or concerns. 

## 2017-04-07 NOTE — Progress Notes (Signed)
Patient ID: Annette Porter, female   DOB: 29-Jan-1951, 66 y.o.   MRN: [id number]  Chief Complaint  Patient presents with  . Follow-up    Mammogram    HPI Annette Porter is a 66 y.o. female. who presents for a breast evaluation. The most recent mammogram was done on 04/02/17.  During her routine physical, thickening in the breast was noted and her PCP arrange for mammography.  Patient herself is unaware of any changes.  No cardiac issues.  Recently seen by Bartholome Bill, MD.   Tolerating letrozole therapy well. Patient does perform regular self breast checks and gets regular mammograms done.     HPI  Past Medical History:  Diagnosis Date  . 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 CAD (coronary artery disease)   . GERD (gastroesophageal reflux disease)   . Hyperlipidemia   . Hypertension   . Hypothyroidism   . Morbid obesity ([place])   . Myocardial infarction ([place]) 07/1998  . Personal history of radiation therapy 2014 and 2015   mammostite for breast ca    Past Surgical History:  Procedure Laterality Date  . BREAST BIOPSY Left 03/22/13    DCIS at 3:00  . BREAST BIOPSY Left 11/11/13   INVASIVE MAMMARY CARCINOMA at 8:00  . BREAST LUMPECTOMY Left 04/29/2013   High grade DCIS, 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  Marland Kitchen BREAST LUMPECTOMY Left 12/16/2013   11 mm ER/ PR positive, Her 2 neu negative, T1c, N0. Mammosite radiation.   Marland Kitchen BREAST LUMPECTOMY WITH MAMMOSITE CAVITY EVALUATION DEVICE Left 2014  . BREAST LUMPECTOMY WITH MAMMOSITE CAVITY EVALUATION DEVICE Left 2015  . BREAST MAMMOSITE  05/20/13  . BREAST SURGERY Left 04/29/13   wide excision  . BREAST SURGERY Left 12-16-13   Wide excision with SN biopsy, INVASIVE MAMMARY CARCINOMA  . CARDIAC CATHETERIZATION    . COLONOSCOPY  2006  . COLONOSCOPY WITH PROPOFOL N/A 05/31/2015   Procedure: COLONOSCOPY WITH  PROPOFOL;  Surgeon: Robert Bellow, MD;  Location[facility] ENDOSCOPY;  Service: Endoscopy;  Laterality: N/A;  . HAND SURGERY    . KNEE ARTHROSCOPY WITH MEDIAL MENISECTOMY Left 02/24/2015   Procedure: KNEE ARTHROSCOPY WITH PARTIAL MEDIAL MENISECTOMY;  Surgeon: Christophe Louis, MD;  Location: [facility] ORS;  Service: Orthopedics;  Laterality: Left;    Family History  Problem Relation Age of Onset  . Brain cancer Father   . Thyroid disease Sister   . Diabetes Maternal Uncle   . Thyroid disease Paternal Aunt   . Thyroid disease Sister   . Thyroid disease Sister   . Breast cancer Neg Hx     Social History Social History  Substance Use Topics  . Smoking status: Never Smoker  . Smokeless tobacco: Never Used  . Alcohol use No    Allergies  Allergen Reactions  . Benazepril Cough  . Dilaudid [Hydromorphone] Nausea And Vomiting  . Hydrocodone Itching  . Tape Other (See Comments)    irratation    Current Outpatient Prescriptions  Medication Sig Dispense Refill  . aspirin 81 MG tablet Take 81 mg by mouth daily.    Marland Kitchen atorvastatin (LIPITOR) 40 MG tablet Take 1 tablet (40 mg total) by mouth daily. 90 tablet 4  . Calcium Carbonate (CALCIUM 600 PO) Take 1 tablet by mouth 2 (two) times daily.    . Cholecalciferol (VITAMIN D3) 1000 UNITS CAPS  Take 1 capsule by mouth daily.    Marland Kitchen ezetimibe (ZETIA) 10 MG tablet Take 1 tablet (10 mg total) by mouth daily. 90 tablet 4  . hydrochlorothiazide (HYDRODIURIL) 25 MG tablet Take 1 tablet (25 mg total) by mouth daily. 90 tablet 4  . ibuprofen (ADVIL,MOTRIN) 800 MG tablet Take 1 tablet (800 mg total) by mouth 3 (three) times daily as needed. 270 tablet 0  . isosorbide mononitrate (IMDUR) 30 MG 24 hr tablet Take 30 mg by mouth daily.    . isosorbide mononitrate (IMDUR) 60 MG 24 hr tablet Take 1 tablet by mouth daily.    Marland Kitchen letrozole (FEMARA) 2.5 MG tablet TAKE ONE TABLET BY MOUTH ONCE DAILY 90 tablet 3  . levothyroxine (SYNTHROID, LEVOTHROID) 100 MCG tablet  Take 1 tablet (100 mcg total) by mouth daily. 30 tablet 2  . metoprolol succinate (TOPROL-XL) 50 MG 24 hr tablet Take 1 tablet (50 mg total) by mouth daily. 90 tablet 4  . nitroGLYCERIN (NITROSTAT) 0.4 MG SL tablet Place 0.4 mg under the tongue every 5 (five) minutes as needed for chest pain.    . ranitidine (ZANTAC) 150 MG tablet Take 150 mg by mouth at bedtime.     . vitamin B-12 (CYANOCOBALAMIN) 250 MCG tablet Take 500 mcg by mouth daily.      No current facility-administered medications for this visit.     Review of Systems Review of Systems  Constitutional: Negative.   Respiratory: Negative.   Cardiovascular: Negative.     Blood pressure 132/76, pulse 60, resp. rate 14, height 5\' 6"  (1.676 m), weight 204 lb (92.5 kg).  Physical Exam Physical Exam  Constitutional: She is oriented to person, place, and time. She appears well-developed and well-nourished.  Eyes: Conjunctivae are normal. No scleral icterus.  Neck: Neck supple.  Cardiovascular: Normal rate, regular rhythm and normal heart sounds.   Pulmonary/Chest: Effort normal and breath sounds normal. Right breast exhibits no inverted nipple, no mass (Right breast larger than left.), no nipple discharge, no skin change and no tenderness. Left breast exhibits no inverted nipple, no mass, no nipple discharge, no skin change and no tenderness. Breasts are asymmetrical.    No upper extremity edema noted.  Lymphadenopathy:    She has no cervical adenopathy.    She has no axillary adenopathy.  Neurological: She is alert and oriented to person, place, and time.  Skin: Skin is warm and dry.    Data Reviewed Bilateral diagnostic mammograms dated January 31, 2017 were reviewed.  Postsurgical changes.  No new areas of microcalcification.  BI-RADS-2.  Assessment    No evidence of recurrent breast cancer.  Overdue for bone density testing.    Plan    We will plan to arrange for bilateral diagnostic mammograms in 1 year.  We will  arrange for bone density testing in the near future.     The patient has been asked to return to the office in one year with a bilateral diagnostic mammogram.. The patient is aware to call back for any questions or concerns. Patient to be scheduled for bone density test.  HPI, Physical Exam, Assessment and Plan have been scribed under the direction and in the presence of Hervey Ard, MD.  Gaspar Cola, CMA     I have completed the exam and reviewed the above documentation for accuracy and completeness.  I agree with the above.  Haematologist has been used and any errors in dictation or transcription are unintentional.  Hervey Ard, M.D., F.A.C.S.  Robert Bellow 04/07/2017, 9:23 AM

## 2017-05-07 ENCOUNTER — Ambulatory Visit
Admission: RE | Admit: 2017-05-07 | Discharge: 2017-05-07 | Disposition: A | Discharge status: Home / Self Care | Payer: Medicare Other | Source: Ambulatory Visit | Attending: General Surgery | Admitting: General Surgery

## 2017-05-07 ENCOUNTER — Other Ambulatory Visit: Payer: Medicare Other

## 2017-05-07 ENCOUNTER — Telehealth: Payer: Self-pay | Admitting: Family Medicine

## 2017-05-07 DIAGNOSIS — C50312 Malignant neoplasm of lower-inner quadrant of left female breast: Secondary | ICD-10-CM | POA: Diagnosis not present

## 2017-05-07 DIAGNOSIS — Z79811 Long term (current) use of aromatase inhibitors: Secondary | ICD-10-CM | POA: Insufficient documentation

## 2017-05-07 DIAGNOSIS — Z13820 Encounter for screening for osteoporosis: Secondary | ICD-10-CM | POA: Diagnosis not present

## 2017-05-07 DIAGNOSIS — E039 Hypothyroidism, unspecified: Secondary | ICD-10-CM

## 2017-05-07 DIAGNOSIS — Z780 Asymptomatic menopausal state: Secondary | ICD-10-CM | POA: Diagnosis not present

## 2017-05-07 NOTE — Telephone Encounter (Signed)
Patient is having issues that may be related to her thyroid since the decrease in her meds.  She had her labs done today and wanted Dr Jeananne Rama to be aware of it.  She has had some increased fatigue and maybe a little slurred speech of which she had when she was first diagnosed with thyroid issues.  She is aware that Dr Jeananne Rama will not be in until next week.  Thanks

## 2017-05-08 ENCOUNTER — Telehealth: Payer: Self-pay | Admitting: *Deleted

## 2017-05-08 LAB — TSH: TSH: 1.6 u[IU]/mL (ref 0.450–4.500)

## 2017-05-08 NOTE — Telephone Encounter (Signed)
-----   Message from Robert Bellow, MD sent at 05/07/2017  4:18 PM EST -----  Please notify bone density was normal. Continue calcium and vitamin D supplements. F/U as scheduled.  ----- Message ----- From: Interface, Rad Results In Sent: 05/07/2017  11:02 AM To: Robert Bellow, MD

## 2017-05-08 NOTE — Telephone Encounter (Signed)
Notified patient as instructed, patient agrees. Patient is in recalls for October 2019 and is aware we will send out a letter to her for dates and times

## 2017-05-12 ENCOUNTER — Encounter: Payer: Self-pay | Admitting: Family Medicine

## 2017-05-14 ENCOUNTER — Other Ambulatory Visit: Payer: Self-pay | Admitting: Family Medicine

## 2017-05-14 MED ORDER — LEVOTHYROXINE SODIUM 100 MCG PO TABS: 100.0000 ug | ORAL_TABLET | Freq: Every day | ORAL | 3 refills | Status: DC

## 2017-05-14 NOTE — Telephone Encounter (Signed)
Copied from [place]. Topic: Quick Communication - See Telephone Encounter >> May 14, 2017 11:19 AM Ivar Drape wrote: CRM for notification. See Telephone encounter for:  05/14/17. Patient expressed that she needs a new refill on the Levothyroxin since you want her to stay on the medication, but it needs to go through [facility] order.

## 2017-05-14 NOTE — Telephone Encounter (Signed)
Pt requesting medication be sent to mail order pharmacy

## 2017-05-21 ENCOUNTER — Ambulatory Visit (INDEPENDENT_AMBULATORY_CARE_PROVIDER_SITE_OTHER): Payer: Medicare Other | Admitting: Family Medicine

## 2017-05-21 ENCOUNTER — Encounter: Payer: Self-pay | Admitting: Family Medicine

## 2017-05-21 VITALS — BP 130/69 | HR 99 | Temp 98.4°F | Wt 205.0 lb

## 2017-05-21 DIAGNOSIS — I2510 Atherosclerotic heart disease of native coronary artery without angina pectoris: Secondary | ICD-10-CM

## 2017-05-21 DIAGNOSIS — I2583 Coronary atherosclerosis due to lipid rich plaque: Secondary | ICD-10-CM

## 2017-05-21 DIAGNOSIS — J019 Acute sinusitis, unspecified: Secondary | ICD-10-CM

## 2017-05-21 MED ORDER — BENZONATATE 100 MG PO CAPS: 100.0000 mg | ORAL_CAPSULE | Freq: Two times a day (BID) | ORAL | 0 refills | Status: DC | PRN

## 2017-05-21 MED ORDER — AMOXICILLIN-POT CLAVULANATE 875-125 MG PO TABS: 1.0000 | ORAL_TABLET | Freq: Two times a day (BID) | ORAL | 0 refills | Status: DC

## 2017-05-21 NOTE — Progress Notes (Signed)
   BP 130/69   Pulse 99   Temp 98.4 F (36.9 C) (Oral)   Wt 205 lb (93 kg)   SpO2 (!) 62%   BMI 33.09 kg/m    Subjective:    Patient ID: Annette Porter, female    DOB: Mar 17, 1951, 66 y.o.   MRN: [id number]  HPI: Annette Porter is a 66 y.o. female  Head cold cough Patient with marked sinus pressure congestion symptoms as been ongoing for over a week has tried over-the-counter medications with norelief having systemic symptoms of just feeling bad poor appetite some low-grade fevers.  Relevant past medical, surgical, family and social history reviewed and updated as indicated. Interim medical history since our last visit reviewed. Allergies and medications reviewed and updated.  Review of Systems  Constitutional: Positive for activity change, chills, diaphoresis and fatigue.  HENT: Positive for congestion, postnasal drip, rhinorrhea, sinus pressure, sinus pain, sneezing and sore throat.   Respiratory: Positive for cough.   Cardiovascular: Negative.     Per HPI unless specifically indicated above     Objective:    BP 130/69   Pulse 99   Temp 98.4 F (36.9 C) (Oral)   Wt 205 lb (93 kg)   SpO2 (!) 62%   BMI 33.09 kg/m   Wt Readings from Last 3 Encounters:  05/21/17 205 lb (93 kg)  04/07/17 204 lb (92.5 kg)  01/29/17 201 lb (91.2 kg)    Physical Exam  Constitutional: She is oriented to person, place, and time. She appears well-developed and well-nourished.  HENT:  Head: Normocephalic and atraumatic.  Right Ear: External ear normal.  Left Ear: External ear normal.  Mouth/Throat: Oropharyngeal exudate present.  Eyes: Conjunctivae and EOM are normal.  Neck: Normal range of motion.  Cardiovascular: Normal rate, regular rhythm and normal heart sounds.  Pulmonary/Chest: Effort normal and breath sounds normal. No respiratory distress. She has no wheezes. She has no rales. She exhibits no tenderness.  Musculoskeletal: Normal range of motion.  Neurological: She is alert  and oriented to person, place, and time.  Skin: No erythema.  Psychiatric: She has a normal mood and affect. Her behavior is normal. Judgment and thought content normal.    Results for orders placed or performed in visit on 05/07/17  TSH  Result Value Ref Range   TSH 1.600 0.450 - 4.500 uIU/mL      Assessment & Plan:   Problem List Items Addressed This Visit    None    Visit Diagnoses    Acute sinusitis, recurrence not specified, unspecified location    -  Primary   Relevant Medications   amoxicillin-clavulanate (AUGMENTIN) 875-125 MG tablet   benzonatate (TESSALON) 100 MG capsule      Discussed sinusitis care and treatment use of medications over-the-counter medications Tylenol and Advil and Aleve nasal rinse Mucinex etc. His is a new problem and no additional workup is planned Follow up plan: Return if symptoms worsen or fail to improve, for As scheduled.

## 2017-06-16 ENCOUNTER — Other Ambulatory Visit: Payer: Self-pay | Admitting: Family Medicine

## 2017-07-15 ENCOUNTER — Encounter: Payer: Self-pay | Admitting: Family Medicine

## 2017-07-16 ENCOUNTER — Encounter: Payer: Self-pay | Admitting: Family Medicine

## 2017-07-16 ENCOUNTER — Ambulatory Visit (INDEPENDENT_AMBULATORY_CARE_PROVIDER_SITE_OTHER): Payer: Medicare Other | Admitting: Family Medicine

## 2017-07-16 VITALS — BP 116/72 | HR 56 | Wt 210.0 lb

## 2017-07-16 DIAGNOSIS — E785 Hyperlipidemia, unspecified: Secondary | ICD-10-CM

## 2017-07-16 DIAGNOSIS — E039 Hypothyroidism, unspecified: Secondary | ICD-10-CM | POA: Diagnosis not present

## 2017-07-16 DIAGNOSIS — I2510 Atherosclerotic heart disease of native coronary artery without angina pectoris: Secondary | ICD-10-CM | POA: Diagnosis not present

## 2017-07-16 DIAGNOSIS — I10 Essential (primary) hypertension: Secondary | ICD-10-CM | POA: Diagnosis not present

## 2017-07-16 DIAGNOSIS — I2583 Coronary atherosclerosis due to lipid rich plaque: Secondary | ICD-10-CM

## 2017-07-16 LAB — LP+ALT+AST PICCOLO, WAIVED
ALT (SGPT) PICCOLO, WAIVED: 30 U/L (ref 10–47)
AST (SGOT) PICCOLO, WAIVED: 23 U/L (ref 11–38)
CHOLESTEROL PICCOLO, WAIVED: 143 mg/dL (ref <200)
Chol/HDL Ratio Piccolo,Waive: 3 mg/dL
HDL CHOL PICCOLO, WAIVED: 47 mg/dL — AB (ref >59)
LDL Chol Calc Piccolo Waived: 68 mg/dL (ref <100)
Triglycerides Piccolo,Waived: 139 mg/dL (ref <150)
VLDL Chol Calc Piccolo,Waive: 28 mg/dL (ref <30)

## 2017-07-16 NOTE — Assessment & Plan Note (Signed)
The current medical regimen is effective;  continue present plan and medications.  

## 2017-07-16 NOTE — Progress Notes (Signed)
   BP 116/72   Pulse (!) 56   Wt 210 lb (95.3 kg)   SpO2 99%   BMI 33.89 kg/m    Subjective:    Patient ID: Annette Porter, female    DOB: 31-Mar-1951, 67 y.o.   MRN: [id number]  HPI: Annette Porter is a 67 y.o. female  Chief Complaint  Patient presents with  . Follow-up   Patient follow-up all in all doing well your from last visit is resolved.  Blood pressure doing well with no complaints or concerns from medication same with cholesterol. Patient's thyroid feels like it is finally doing well.  With no issues of fatigue etc.  Relevant past medical, surgical, family and social history reviewed and updated as indicated. Interim medical history since our last visit reviewed. Allergies and medications reviewed and updated.  Review of Systems  Constitutional: Negative.   Respiratory: Negative.   Cardiovascular: Negative.     Per HPI unless specifically indicated above     Objective:    BP 116/72   Pulse (!) 56   Wt 210 lb (95.3 kg)   SpO2 99%   BMI 33.89 kg/m   Wt Readings from Last 3 Encounters:  07/16/17 210 lb (95.3 kg)  05/21/17 205 lb (93 kg)  04/07/17 204 lb (92.5 kg)    Physical Exam  Constitutional: She is oriented to person, place, and time. She appears well-developed and well-nourished.  HENT:  Head: Normocephalic and atraumatic.  Eyes: Conjunctivae and EOM are normal.  Neck: Normal range of motion.  Cardiovascular: Normal rate, regular rhythm and normal heart sounds.  Pulmonary/Chest: Effort normal and breath sounds normal.  Musculoskeletal: Normal range of motion.  Neurological: She is alert and oriented to person, place, and time.  Skin: No erythema.  Psychiatric: She has a normal mood and affect. Her behavior is normal. Judgment and thought content normal.    Results for orders placed or performed in visit on 05/07/17  TSH  Result Value Ref Range   TSH 1.600 0.450 - 4.500 uIU/mL      Assessment & Plan:   Problem List Items Addressed This  Visit      Cardiovascular and Mediastinum   Hypertension - Primary    The current medical regimen is effective;  continue present plan and medications.       Relevant Orders   Basic metabolic panel   LP+ALT+AST Piccolo, Waived   CAD (coronary artery disease)    The current medical regimen is effective;  continue present plan and medications.         Endocrine   Hypothyroidism    The current medical regimen is effective;  continue present plan and medications.       Relevant Orders   TSH     Other   Hyperlipidemia    The current medical regimen is effective;  continue present plan and medications.       Relevant Orders   Basic metabolic panel   LP+ALT+AST Piccolo, Waived       Follow up plan: Return in about 6 months (around 01/13/2018) for Physical Exam.

## 2017-07-17 ENCOUNTER — Encounter: Payer: Self-pay | Admitting: Family Medicine

## 2017-07-17 LAB — TSH: TSH: 2.94 u[IU]/mL (ref 0.450–4.500)

## 2017-07-17 LAB — BASIC METABOLIC PANEL
BUN/Creatinine Ratio: 23 (ref 12–28)
BUN: 17 mg/dL (ref 8–27)
CALCIUM: 9 mg/dL (ref 8.7–10.3)
CO2: 25 mmol/L (ref 20–29)
CREATININE: 0.75 mg/dL (ref 0.57–1.00)
Chloride: 102 mmol/L (ref 96–106)
GFR calc Af Amer: 95 mL/min/{1.73_m2} (ref >59)
GFR, EST NON AFRICAN AMERICAN: 83 mL/min/{1.73_m2} (ref >59)
GLUCOSE: 81 mg/dL (ref 65–99)
POTASSIUM: 4.3 mmol/L (ref 3.5–5.2)
SODIUM: 141 mmol/L (ref 134–144)

## 2017-08-06 ENCOUNTER — Ambulatory Visit: Payer: Medicare Other | Admitting: Family Medicine

## 2017-08-07 ENCOUNTER — Other Ambulatory Visit: Payer: Self-pay

## 2017-08-07 MED ORDER — IBUPROFEN 800 MG PO TABS: 800.0000 mg | ORAL_TABLET | Freq: Three times a day (TID) | ORAL | 0 refills | Status: DC | PRN

## 2017-09-22 ENCOUNTER — Ambulatory Visit
Admission: RE | Admit: 2017-09-22 | Discharge: 2017-09-22 | Disposition: A | Discharge status: Home / Self Care | Payer: Medicare Other | Source: Ambulatory Visit | Attending: Radiation Oncology | Admitting: Radiation Oncology

## 2017-09-22 ENCOUNTER — Encounter: Payer: Self-pay | Admitting: Radiation Oncology

## 2017-09-22 ENCOUNTER — Other Ambulatory Visit: Payer: Self-pay

## 2017-09-22 VITALS — BP 151/82 | HR 64 | Temp 98.1°F | Resp 18 | Wt 215.6 lb

## 2017-09-22 DIAGNOSIS — I25118 Atherosclerotic heart disease of native coronary artery with other forms of angina pectoris: Secondary | ICD-10-CM | POA: Diagnosis not present

## 2017-09-22 DIAGNOSIS — Z170 Estrogen receptor positive status [ER+]: Secondary | ICD-10-CM | POA: Diagnosis not present

## 2017-09-22 DIAGNOSIS — E119 Type 2 diabetes mellitus without complications: Secondary | ICD-10-CM | POA: Diagnosis not present

## 2017-09-22 DIAGNOSIS — C50312 Malignant neoplasm of lower-inner quadrant of left female breast: Secondary | ICD-10-CM

## 2017-09-22 DIAGNOSIS — Z853 Personal history of malignant neoplasm of breast: Secondary | ICD-10-CM | POA: Diagnosis not present

## 2017-09-22 DIAGNOSIS — Z923 Personal history of irradiation: Secondary | ICD-10-CM | POA: Insufficient documentation

## 2017-09-22 DIAGNOSIS — D0512 Intraductal carcinoma in situ of left breast: Secondary | ICD-10-CM | POA: Diagnosis not present

## 2017-09-22 DIAGNOSIS — E782 Mixed hyperlipidemia: Secondary | ICD-10-CM | POA: Diagnosis not present

## 2017-09-22 DIAGNOSIS — Z08 Encounter for follow-up examination after completed treatment for malignant neoplasm: Secondary | ICD-10-CM | POA: Diagnosis not present

## 2017-09-22 DIAGNOSIS — I2510 Atherosclerotic heart disease of native coronary artery without angina pectoris: Secondary | ICD-10-CM | POA: Diagnosis not present

## 2017-09-22 DIAGNOSIS — I480 Paroxysmal atrial fibrillation: Secondary | ICD-10-CM | POA: Diagnosis not present

## 2017-09-22 NOTE — Progress Notes (Signed)
Radiation Oncology Follow up Note  Name: Annette Porter   Date:   09/22/2017 MRN:  [id number] DOB: 1950-12-05    This 67 y.o. 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 Seen today in routine follow-up she is doing well she specifically denies breast tenderness cough or bone pain..she is currently on Femara tolerate that well without side effect.her last mammograms and 5 reviewed were in October were BI-RADS 2 benign.  COMPLICATIONS OF TREATMENT: none  FOLLOW UP COMPLIANCE: keeps appointments   PHYSICAL EXAM:  BP (!) 151/82   Pulse 64   Temp 98.1 F (36.7 C)   Resp 18   Wt 215 lb 9.8 oz (97.8 kg)   BMI 34.80 kg/m  Lungs are clear to A&P cardiac examination essentially unremarkable with regular rate and rhythm. No dominant mass or nodularity is noted in either breast in 2 positions examined. Incision is well-healed. No axillary or supraclavicular adenopathy is appreciated. Cosmetic result is excellent. Well-developed well-nourished patient in NAD. HEENT reveals PERLA, EOMI, discs not visualized.  Oral cavity is clear. No oral mucosal lesions are identified. Neck is clear without evidence of cervical or supraclavicular adenopathy. Lungs are clear to A&P. Cardiac examination is essentially unremarkable with regular rate and rhythm without murmur rub or thrill. Abdomen is benign with no organomegaly or masses noted. Motor sensory and DTR levels are equal and symmetric in the upper and lower extremities. Cranial nerves II through XII are grossly intact. Proprioception is intact. No peripheral adenopathy or edema is identified. No motor or sensory levels are noted. Crude visual fields  are within normal range.  RADIOLOGY RESULTS: mammograms reviewed compatible with the above-stated findings  PLAN: present time she continues to do well with no evidence of disease. I am please were overall progress. She is a rescheduled for follow-up mammograms. She continues on Femara without side effect. I have asked to see her back in 1 year for follow-up. Patient knows to call with any concerns.  I would like to take this opportunity to thank you for allowing me to participate in the care of your patient.Noreene Filbert, MD

## 2017-10-22 ENCOUNTER — Ambulatory Visit: Payer: Self-pay | Admitting: *Deleted

## 2017-10-22 NOTE — Telephone Encounter (Signed)
Pt called wanting to know what meds she could take over the counter for cough and chest congestion. She is already taking BC Sinus and cold med and also a generic allergy medicine.  It was recommended that she uses a humidifier, drink lots of fluids, get rest and try the Coricidin HBP. She has a history of HBP.  She denies a fever. Just wanted meds over the counter for cold and congestion. Also advised to speak with her pharmacist for recommendations. Pt voiced understanding and will call back if she develops a fever and worsening symptoms.  Reason for Disposition . Health Information question, no triage required and triager able to answer question  Answer Assessment - Initial Assessment Questions 1. SYMPTOMS: "Do you have any symptoms?"     Congestion in chest and cough 2. SEVERITY: If symptoms are present, ask "Are they mild, moderate or severe?"     moderate  Answer Assessment - Initial Assessment Questions 1. REASON FOR CALL or QUESTION: "What is your reason for calling today?" or "How can I best help you?" or "What question do you have that I can help answer?"     Pt had question regarding what to take for cough and chest congestion to take over the counter.  Protocols used: INFORMATION ONLY CALL-A-AH, MEDICATION QUESTION CALL-A-AH

## 2017-10-24 ENCOUNTER — Ambulatory Visit
Admission: EM | Admit: 2017-10-24 | Discharge: 2017-10-24 | Disposition: A | Discharge status: Home / Self Care | Payer: Medicare Other | Attending: Family Medicine | Admitting: Family Medicine

## 2017-10-24 ENCOUNTER — Other Ambulatory Visit: Payer: Self-pay

## 2017-10-24 DIAGNOSIS — J209 Acute bronchitis, unspecified: Secondary | ICD-10-CM | POA: Diagnosis not present

## 2017-10-24 MED ORDER — ALBUTEROL SULFATE HFA 108 (90 BASE) MCG/ACT IN AERS: 1.0000 | INHALATION_SPRAY | Freq: Four times a day (QID) | RESPIRATORY_TRACT | 0 refills | Status: DC | PRN

## 2017-10-24 MED ORDER — BENZONATATE 100 MG PO CAPS: 100.0000 mg | ORAL_CAPSULE | Freq: Three times a day (TID) | ORAL | 0 refills | Status: DC | PRN

## 2017-10-24 MED ORDER — DOXYCYCLINE HYCLATE 100 MG PO CAPS: 100.0000 mg | ORAL_CAPSULE | Freq: Two times a day (BID) | ORAL | 0 refills | Status: DC

## 2017-10-24 NOTE — ED Provider Notes (Signed)
[facility]    CSN: [id number] Arrival date & time: 10/24/17  1050  History   Chief Complaint Chief Complaint  Patient presents with  . Cough   HPI  67 year old female presents with respiratory symptoms.  Patient reports that she has been sick for the past 9 to 10 days.  Patient states that she has had sore throat, congestion, headache, hoarseness, and cough.  Cough is the predominant symptom.  Severe.  Symptoms are worsening.  No known exacerbating or relieving factors.  No other associated symptoms.  No other complaints.  Past Medical History:  Diagnosis Date  . Breast cancer ([place]) 2014 and 2015   left breast DCIS at 3:00 in 2014 and left breast invasive mammary carcinoma near 8:00  . CAD (coronary artery disease)   . GERD (gastroesophageal reflux disease)   . Hyperlipidemia   . Hypertension   . Hypothyroidism   . Morbid obesity ([place])   . Myocardial infarction ([place]) 07/1998  . Personal history of radiation therapy 2014 and 2015   mammostite for breast ca    Patient Active Problem List   Diagnosis Date Noted  . Advanced care planning/counseling discussion 01/29/2017  . GERD (gastroesophageal reflux disease) 01/10/2016  . Hypothyroidism 07/12/2015  . Hyperlipidemia 12/21/2014  . Arthritis of left knee 12/21/2014  . Hypertension 12/21/2014  . CAD (coronary artery disease) 12/21/2014  . Atrophic vaginitis 12/21/2014  . Breast cancer, stage 1, estrogen receptor positive ([place]); pT1c, N0; Wide excision, mammosite.  04/04/2014  . DCIS (ductal carcinoma in situ) High grade DCIS, 0.5 mm margin. Wide excision, Mammosite radiation. 03/30/2013  . Myocardial infarction ([place]) 07/11/1998    Past Surgical History:  Procedure Laterality Date  . BREAST BIOPSY Left 03/22/13    DCIS at 3:00  . BREAST BIOPSY Left 11/11/13   INVASIVE MAMMARY CARCINOMA at 8:00  . BREAST LUMPECTOMY Left 04/29/2013   High grade DCIS, anterior margin 0.5 mm. 24 mm maximum diameter. ER/ PR  negative. Mammosite.  Marland Kitchen BREAST LUMPECTOMY Left 12/16/2013   11 mm ER/ PR positive, Her 2 neu negative, T1c, N0. Mammosite radiation.   Marland Kitchen BREAST LUMPECTOMY WITH MAMMOSITE CAVITY EVALUATION DEVICE Left 2014  . BREAST LUMPECTOMY WITH MAMMOSITE CAVITY EVALUATION DEVICE Left 2015  . BREAST MAMMOSITE  05/20/13  . BREAST SURGERY Left 04/29/13   wide excision  . BREAST SURGERY Left 12-16-13   Wide excision with SN biopsy, INVASIVE MAMMARY CARCINOMA  . CARDIAC CATHETERIZATION    . COLONOSCOPY  2006  . COLONOSCOPY WITH PROPOFOL N/A 05/31/2015   Procedure: COLONOSCOPY WITH PROPOFOL;  Surgeon: Robert Bellow, MD;  Location: [facility] ENDOSCOPY;  Service: Endoscopy;  Laterality: N/A;  . HAND SURGERY    . KNEE ARTHROSCOPY WITH MEDIAL MENISECTOMY Left 02/24/2015   Procedure: KNEE ARTHROSCOPY WITH PARTIAL MEDIAL MENISECTOMY;  Surgeon: Christophe Louis, MD;  Location: [facility] ORS;  Service: Orthopedics;  Laterality: Left;    OB History   None      Home Medications    Prior to Admission medications   Medication Sig Start Date End Date Taking? Authorizing Provider  aspirin 81 MG tablet Take 81 mg by mouth daily.   Yes [provider]  atorvastatin (LIPITOR) 40 MG tablet Take 1 tablet (40 mg total) by mouth daily. 01/29/17  Yes Crissman, Jeannette How, MD  Calcium Carbonate (CALCIUM 600 PO) Take 1 tablet by mouth 2 (two) times daily.   Yes [provider]  Cholecalciferol (VITAMIN D3) 1000 UNITS CAPS Take 1 capsule by mouth  daily.   Yes [provider]  ezetimibe (ZETIA) 10 MG tablet Take 1 tablet (10 mg total) by mouth daily. 01/29/17  Yes Crissman, Jeannette How, MD  hydrochlorothiazide (HYDRODIURIL) 25 MG tablet Take 1 tablet (25 mg total) by mouth daily. 01/29/17  Yes Crissman, Jeannette How, MD  ibuprofen (ADVIL,MOTRIN) 800 MG tablet Take 1 tablet (800 mg total) by mouth 3 (three) times daily as needed. 08/07/17  Yes Johnson, Megan P, DO  isosorbide mononitrate (IMDUR) 30 MG 24 hr tablet Take 30 mg  by mouth daily.   Yes [provider]  isosorbide mononitrate (IMDUR) 60 MG 24 hr tablet Take 1 tablet by mouth daily. 01/11/13  Yes [provider]  letrozole ([place]) 2.5 MG tablet TAKE ONE TABLET BY MOUTH ONCE DAILY 03/13/15  Yes Byrnett, Forest Gleason, MD  levothyroxine (SYNTHROID, LEVOTHROID) 100 MCG tablet Take 1 tablet (100 mcg total) by mouth daily. 05/14/17  Yes Crissman, Jeannette How, MD  metoprolol succinate (TOPROL-XL) 50 MG 24 hr tablet Take 1 tablet (50 mg total) by mouth daily. 01/29/17  Yes Crissman, Jeannette How, MD  nitroGLYCERIN (NITROSTAT) 0.4 MG SL tablet Place 0.4 mg under the tongue every 5 (five) minutes as needed for chest pain.   Yes [provider]  ranitidine (ZANTAC) 150 MG tablet Take 150 mg by mouth at bedtime.    Yes [provider]  vitamin B-12 (CYANOCOBALAMIN) 250 MCG tablet Take 500 mcg by mouth daily.    Yes [provider]  albuterol (PROVENTIL HFA;VENTOLIN HFA) 108 (90 Base) MCG/ACT inhaler Inhale 1-2 puffs into the lungs every 6 (six) hours as needed for wheezing or shortness of breath. 10/24/17   Coral Spikes, DO  benzonatate (TESSALON) 100 MG capsule Take 1 capsule (100 mg total) by mouth 3 (three) times daily as needed. 10/24/17   Coral Spikes, DO  doxycycline (VIBRAMYCIN) 100 MG capsule Take 1 capsule (100 mg total) by mouth 2 (two) times daily. 10/24/17   Coral Spikes, DO    Family History Family History  Problem Relation Age of Onset  . Brain cancer Father   . Thyroid disease Sister   . Diabetes Maternal Uncle   . Thyroid disease Paternal Aunt   . Thyroid disease Sister   . Thyroid disease Sister   . Breast cancer Neg Hx     Social History Social History   Tobacco Use  . Smoking status: Never Smoker  . Smokeless tobacco: Never Used  Substance Use Topics  . Alcohol use: No  . Drug use: No     Allergies   Benazepril; Dilaudid [hydromorphone]; Hydrocodone; and Tape   Review of Systems Review of Systems Per  HPI  Physical Exam Triage Vital Signs ED Triage Vitals  Enc Vitals Group     BP 10/24/17 1130 139/67     Pulse Rate 10/24/17 1130 70     Resp 10/24/17 1130 17     Temp 10/24/17 1130 98.1 F (36.7 C)     Temp Source 10/24/17 1130 Oral     SpO2 10/24/17 1130 97 %     Weight 10/24/17 1128 215 lb (97.5 kg)     Height 10/24/17 1128 5\' 3"  (1.6 m)     Head Circumference --      Peak Flow --      Pain Score 10/24/17 1128 7     Pain Loc --      Pain Edu? --      Excl. in [place]? --  Updated Vital Signs BP 139/67 (BP Location: Right Arm)   Pulse 70   Temp 98.1 F (36.7 C) (Oral)   Resp 17   Ht 5\' 3"  (1.6 m)   Wt 215 lb (97.5 kg)   SpO2 97%   BMI 38.09 kg/m   Physical Exam  Constitutional: She is oriented to person, place, and time. She appears well-developed. No distress.  HENT:  Head: Normocephalic and atraumatic.  Eyes: Conjunctivae are normal. Right eye exhibits no discharge. Left eye exhibits no discharge.  Cardiovascular: Normal rate and regular rhythm.  Pulmonary/Chest: Effort normal.  Expiratory wheezing.  Neurological: She is alert and oriented to person, place, and time.  Psychiatric: She has a normal mood and affect. Her behavior is normal.  Nursing note and vitals reviewed.  UC Treatments / Results  Labs (all labs ordered are listed, but only abnormal results are displayed) Labs Reviewed - No data to display  EKG None  Radiology No results found.  Procedures Procedures (including critical care time)  Medications Ordered in UC Medications - No data to display  Initial Impression / Assessment and Plan / UC Course  I have reviewed the triage vital signs and the nursing notes.  Pertinent labs & imaging results that were available during my care of the patient were reviewed by me and considered in my medical decision making (see chart for details).    67 year old female presents with acute bronchitis.  Treated with doxycycline, Tessalon Perles, and  albuterol.  Final Clinical Impressions(s) / UC Diagnoses   Final diagnoses:  Acute bronchitis, unspecified organism   Discharge Instructions   None    ED Prescriptions    Medication Sig Dispense Auth. Provider   doxycycline (VIBRAMYCIN) 100 MG capsule Take 1 capsule (100 mg total) by mouth 2 (two) times daily. 14 capsule Tayden Nichelson G, DO   benzonatate (TESSALON) 100 MG capsule Take 1 capsule (100 mg total) by mouth 3 (three) times daily as needed. 30 capsule Alvah Lagrow G, DO   albuterol (PROVENTIL HFA;VENTOLIN HFA) 108 (90 Base) MCG/ACT inhaler Inhale 1-2 puffs into the lungs every 6 (six) hours as needed for wheezing or shortness of breath. [place], DO     Controlled Substance Prescriptions [place] Controlled Substance Registry consulted? Not Applicable   Coral Spikes, DO 10/24/17 1255

## 2017-10-24 NOTE — ED Triage Notes (Signed)
Patient complains of cough and congestion with worsening symptoms. Patient states that symptoms started on 05/08. Patient states she is also having sinus pain and pressure with hoarseness.

## 2018-01-23 ENCOUNTER — Ambulatory Visit: Payer: Medicare Other

## 2018-01-26 ENCOUNTER — Ambulatory Visit (INDEPENDENT_AMBULATORY_CARE_PROVIDER_SITE_OTHER): Payer: Medicare Other

## 2018-01-26 VITALS — BP 132/64 | HR 65 | Temp 98.0°F | Resp 16 | Ht 61.0 in | Wt 217.6 lb

## 2018-01-26 DIAGNOSIS — C50912 Malignant neoplasm of unspecified site of left female breast: Secondary | ICD-10-CM | POA: Diagnosis not present

## 2018-01-26 DIAGNOSIS — Z170 Estrogen receptor positive status [ER+]: Secondary | ICD-10-CM | POA: Diagnosis not present

## 2018-01-26 DIAGNOSIS — Z0000 Encounter for general adult medical examination without abnormal findings: Secondary | ICD-10-CM | POA: Diagnosis not present

## 2018-01-26 NOTE — Progress Notes (Signed)
Subjective:   Annette Porter is a 67 y.o. female who presents for an Initial Medicare Annual Wellness Visit.  Review of Systems      Cardiac Risk Factors include: advanced age (>68men, >57 women);obesity (BMI >30kg/m2);dyslipidemia;hypertension     Objective:    Today's Vitals   01/26/18 0809  BP: 132/64  Pulse: 65  Resp: 16  Temp: 98 F (36.7 C)  TempSrc: Temporal  Weight: 217 lb 9.6 oz (98.7 kg)  Height: 5\' 1"  (1.549 m)   Body mass index is 41.12 kg/m.  Advanced Directives 01/26/2018 10/24/2017 09/22/2017 01/22/2017 05/31/2015 02/24/2015 02/08/2015  Does Patient Have a Medical Advance Directive? No No No No No No No  Would patient like information on creating a medical advance directive? Yes (MAU/Ambulatory/Procedural Areas - Information given) - No - Patient declined Yes (MAU/Ambulatory/Procedural Areas - Information given) - No - patient declined information No - patient declined information    Current Medications (verified) Outpatient Encounter Medications as of 01/26/2018  Medication Sig  . aspirin 81 MG tablet Take 81 mg by mouth daily.  Marland Kitchen atorvastatin (LIPITOR) 40 MG tablet Take 1 tablet (40 mg total) by mouth daily.  . Calcium Carbonate (CALCIUM 600 PO) Take 1 tablet by mouth 2 (two) times daily.  . Cholecalciferol (VITAMIN D3) 1000 UNITS CAPS Take 1 capsule by mouth daily.  Marland Kitchen ezetimibe (ZETIA) 10 MG tablet Take 1 tablet (10 mg total) by mouth daily.  . hydrochlorothiazide (HYDRODIURIL) 25 MG tablet Take 1 tablet (25 mg total) by mouth daily.  Marland Kitchen ibuprofen (ADVIL,MOTRIN) 800 MG tablet Take 1 tablet (800 mg total) by mouth 3 (three) times daily as needed.  . isosorbide mononitrate (IMDUR) 30 MG 24 hr tablet Take 30 mg by mouth daily.  . isosorbide mononitrate (IMDUR) 60 MG 24 hr tablet Take 1 tablet by mouth daily.  Marland Kitchen letrozole (FEMARA) 2.5 MG tablet TAKE ONE TABLET BY MOUTH ONCE DAILY  . levothyroxine (SYNTHROID, LEVOTHROID) 100 MCG tablet Take 1 tablet (100 mcg  total) by mouth daily.  . metoprolol succinate (TOPROL-XL) 50 MG 24 hr tablet Take 1 tablet (50 mg total) by mouth daily.  . ranitidine (ZANTAC) 150 MG tablet Take 150 mg by mouth at bedtime.   . vitamin B-12 (CYANOCOBALAMIN) 250 MCG tablet Take 500 mcg by mouth daily.   Marland Kitchen albuterol (PROVENTIL HFA;VENTOLIN HFA) 108 (90 Base) MCG/ACT inhaler Inhale 1-2 puffs into the lungs every 6 (six) hours as needed for wheezing or shortness of breath. (Patient not taking: Reported on 01/26/2018)  . benzonatate (TESSALON) 100 MG capsule Take 1 capsule (100 mg total) by mouth 3 (three) times daily as needed. (Patient not taking: Reported on 01/26/2018)  . doxycycline (VIBRAMYCIN) 100 MG capsule Take 1 capsule (100 mg total) by mouth 2 (two) times daily. (Patient not taking: Reported on 01/26/2018)  . nitroGLYCERIN (NITROSTAT) 0.4 MG SL tablet Place 0.4 mg under the tongue every 5 (five) minutes as needed for chest pain.   No facility-administered encounter medications on file as of 01/26/2018.     Allergies (verified) Benazepril; Dilaudid [hydromorphone]; Hydrocodone; and Tape   History: Past Medical History:  Diagnosis Date  . 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 CAD (coronary artery disease)   . GERD (gastroesophageal reflux disease)   . Hyperlipidemia   . Hypertension   . Hypothyroidism   . Morbid obesity ([place])   . Myocardial  infarction ([place]) 07/1998  . Personal history of radiation therapy 2014 and 2015   mammostite for breast ca   Past Surgical History:  Procedure Laterality Date  . BREAST BIOPSY Left 03/22/13    DCIS at 3:00  . BREAST BIOPSY Left 11/11/13   INVASIVE MAMMARY CARCINOMA at 8:00  . BREAST LUMPECTOMY Left 04/29/2013   High grade DCIS, 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  Marland Kitchen BREAST LUMPECTOMY Left 12/16/2013   11 mm ER/ PR positive, Her 2 neu negative, T1c,  N0. Mammosite radiation.   Marland Kitchen BREAST LUMPECTOMY WITH MAMMOSITE CAVITY EVALUATION DEVICE Left 2014  . BREAST LUMPECTOMY WITH MAMMOSITE CAVITY EVALUATION DEVICE Left 2015  . BREAST MAMMOSITE  05/20/13  . BREAST SURGERY Left 04/29/13   wide excision  . BREAST SURGERY Left 12-16-13   Wide excision with SN biopsy, INVASIVE MAMMARY CARCINOMA  . CARDIAC CATHETERIZATION    . COLONOSCOPY  2006  . COLONOSCOPY WITH PROPOFOL N/A 05/31/2015   Procedure: COLONOSCOPY WITH PROPOFOL;  Surgeon: Robert Bellow, MD;  Location: [facility] ENDOSCOPY;  Service: Endoscopy;  Laterality: N/A;  . HAND SURGERY    . KNEE ARTHROSCOPY WITH MEDIAL MENISECTOMY Left 02/24/2015   Procedure: KNEE ARTHROSCOPY WITH PARTIAL MEDIAL MENISECTOMY;  Surgeon: Christophe Louis, MD;  Location: [facility] ORS;  Service: Orthopedics;  Laterality: Left;   Family History  Problem Relation Age of Onset  . Brain cancer Father   . Thyroid disease Sister   . Diabetes Maternal Uncle   . Thyroid disease Paternal Aunt   . Thyroid disease Sister   . Thyroid disease Sister   . Breast cancer Neg Hx    Social History   Socioeconomic History  . Marital status: Married    Spouse name: Not on file  . Number of children: Not on file  . Years of education: Not on file  . Highest education level: Some college, no degree  Occupational History  . Not on file  Social Needs  . Financial resource strain: Not hard at all  . Food insecurity:    Worry: Never true    Inability: Never true  . Transportation needs:    Medical: No    Non-medical: No  Tobacco Use  . Smoking status: Never Smoker  . Smokeless tobacco: Never Used  Substance and Sexual Activity  . Alcohol use: No  . Drug use: No  . Sexual activity: Not on file  Lifestyle  . Physical activity:    Days per week: 0 days    Minutes per session: 0 min  . Stress: Not at all  Relationships  . Social connections:    Talks on phone: More than three times a week    Gets together: More than three  times a week    Attends religious service: More than 4 times per year    Active member of club or organization: No    Attends meetings of clubs or organizations: Never    Relationship status: Married  Other Topics Concern  . Not on file  Social History Narrative  . 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 N  Vision? N  Difficulty concentrating or making decisions? N  Walking or climbing stairs? N  Dressing or bathing? N  Doing errands, shopping? N  Preparing Food and eating ? N  Using the Toilet? N  In the past six months, have you accidently leaked urine? N  Do you have problems with loss of bowel control? N  Managing your Medications? N  Managing your Finances? N  Housekeeping or managing your Housekeeping? N  Some recent data might be hidden     Immunizations and Health Maintenance Immunization History  Administered Date(s) Administered  . Influenza, High Dose Seasonal PF 03/19/2016, 03/17/2017  . Influenza,inj,Quad PF,6+ Mos 03/22/2015  . Pneumococcal Conjugate-13 12/21/2014  . Pneumococcal-Unspecified 04/12/2004, 06/26/2009  . Td 04/29/2005, 12/21/2014   Health Maintenance Due  Topic Date Due  . HEMOGLOBIN A1C  30-Mar-1951  . FOOT EXAM  11/29/1959  . OPHTHALMOLOGY EXAM  11/29/1959  . URINE MICROALBUMIN  11/29/1959  .  INFLUENZA VACCINE  01/08/2018    Patient Care Team: Guadalupe Maple, MD as PCP - General (Family Medicine) Bary Castilla, Forest Gleason, MD (General Surgery) Guadalupe Maple, MD (Family Medicine) Noreene Filbert, MD as Referring Physician (Radiation Oncology) Lollie Sails, MD as Consulting Physician (Gastroenterology) Teodoro Spray, MD as Consulting Physician (Cardiology)  Indicate any recent Medical Services you may have received from other than [facility] providers in the past year (date may be approximate).     Assessment:   This is a routine wellness examination for Ne[place].  Hearing/Vision screen No exam data present  Dietary issues and exercise activities discussed: Current Exercise Habits: The patient does not participate in regular exercise at present, Exercise limited by: None identified  Goals    . DIET - INCREASE WATER INTAKE     Recommend drinking at least 6-8 glasses of water a day      Depression Screen PHQ 2/9 Scores 01/26/2018 09/22/2017 07/16/2017 01/22/2017 09/16/2016 01/10/2016  PHQ - 2 Score 0 0 0 0 0 0    Fall Risk Fall Risk  01/26/2018 09/22/2017 07/16/2017 05/21/2017 01/22/2017  Falls in the past year? No No No No No    Is the patient's home free of loose throw rugs in walkways, pet beds, electrical cords, etc?   yes      Grab bars in the bathroom? no      Handrails on the stairs?   yes      Adequate lighting?   yes  Timed Get Up and Go Performed Completed in 8 seconds with no use of assistive devices, steady gait. No intervention needed at this time.   Cognitive Function:     6CIT Screen 01/26/2018 01/22/2017  What Year? 0 points 0 points  What month? 0 points 0 points  What time? 0 points 0 points  Count back from 20 0 points 0 points  Months in reverse 0 points 0 points  Repeat phrase 0 points 0 points  Total Score 0 0    Screening Tests Health Maintenance  Topic Date Due  . HEMOGLOBIN A1C  July 11, 1950  . FOOT EXAM  11/29/1959  . OPHTHALMOLOGY EXAM   11/29/1959  . URINE MICROALBUMIN  11/29/1959  . INFLUENZA  VACCINE  01/08/2018  . MAMMOGRAM  04/03/2019  . TETANUS/TDAP  12/20/2024  . COLONOSCOPY  05/30/2025  . DEXA SCAN  Completed  . Hepatitis C Screening  Completed  . PNA vac Low Risk Adult  Completed    Qualifies for Shingles Vaccine? Yes, discussed shingrix vaccine   Cancer Screenings: Lung: Low Dose CT Chest recommended if Age 10-80 years, 30 pack-year currently smoking OR have quit w/in 15years. Patient does not qualify. Breast: Up to date on Mammogram? Yes  04/02/2017 Up to date of Bone Density/Dexa? Yes 05/07/2017 Colorectal: completed 05/31/2015  Additional Screenings:  Hepatitis C Screening: completed 01/22/2017     Plan:    I have personally reviewed and addressed the Medicare Annual Wellness questionnaire and have noted the following in the patient's chart:  A. Medical and social history B. Use of alcohol, tobacco or illicit drugs  C. Current medications and supplements D. Functional ability and status E.  Nutritional status F.  Physical activity G. Advance directives H. List of other physicians I.  Hospitalizations, surgeries, and ER visits in previous 12 months J.  [place] such as hearing and vision if needed, cognitive and depression L. Referrals and appointments   In addition, I have reviewed and discussed with patient certain preventive protocols, quality metrics, and best practice recommendations. A written personalized care plan for preventive services as well as general preventive health recommendations were provided to patient.   Signed,  Tyler Aas, LPN Nurse Health Advisor   Nurse Notes: due for diabetic foot exam- cpe on 02/03/2018 with Dr.Crissman. Patient states she is not a diabetic, Dr.Crissman- can you look over problem list, it looks like she was diagnosed in 2015. No A1c's done in epic.    Patient is taking 125mg  of levothyroxine, she states it was decreased to 124mcg  sometime last year but didn't feel it was effective so she restarted on the 168mcg dose.

## 2018-01-26 NOTE — Patient Instructions (Addendum)
Annette Porter , Thank you for taking time to come for your Medicare Wellness Visit. I appreciate your ongoing commitment to your health goals. Please review the following plan we discussed and let me know if I can assist you in the future.   Screening recommendations/referrals:   Colonoscopy: completed 05/31/2015  Mammogram: completed 04/02/2017 Please call [phone] to schedule your mammogram.  Bone Density: completed 05/07/2017 Recommended yearly ophthalmology/optometry visit for glaucoma screening and checkup Recommended yearly dental visit for hygiene and checkup  Vaccinations: Influenza vaccine: due 02/2018 Pneumococcal vaccine: completed series Tdap vaccine: up to date Shingles vaccine: shingrix eligible, check with your insurance companuy for coverage     Advanced directives:Advance directive discussed with you today. I have provided a copy for you to complete at home and have notarized. Once this is complete please bring a copy in to our office so we can scan it into your chart.  Conditions/risks identified: Recommend drinking at least 6-8 glasses of water a day  Next appointment: Follow up on 02/03/2018 at 8:00am with [place]. Follow up in one year for your annual wellness exam.    Preventive Care 65 Years and Older, Female Preventive care refers to lifestyle choices and visits with your health care provider that can promote health and wellness. What does preventive care include?  A yearly physical exam. This is also called an annual well check.  Dental exams once or twice a year.  Routine eye exams. Ask your health care provider how often you should have your eyes checked.  Personal lifestyle choices, including:  Daily care of your teeth and gums.  Regular physical activity.  Eating a healthy diet.  Avoiding tobacco and drug use.  Limiting alcohol use.  Practicing safe sex.  Taking low-dose aspirin every day.  Taking vitamin and mineral supplements as  recommended by your health care provider. What happens during an annual well check? The services and screenings done by your health care provider during your annual well check will depend on your age, overall health, lifestyle risk factors, and family history of disease. Counseling  Your health care provider may ask you questions about your:  Alcohol use.  Tobacco use.  Drug use.  Emotional well-being.  Home and relationship well-being.  Sexual activity.  Eating habits.  History of falls.  Memory and ability to understand (cognition).  Work and work Statistician.  Reproductive health. Screening  You may have the following tests or measurements:  Height, weight, and BMI.  Blood pressure.  Lipid and cholesterol levels. These may be checked every 5 years, or more frequently if you are over 68 years old.  Skin check.  Lung cancer screening. You may have this screening every year starting at age 79 if you have a 30-pack-year history of smoking and currently smoke or have quit within the past 15 years.  Fecal occult blood test (FOBT) of the stool. You may have this test every year starting at age 83.  Flexible sigmoidoscopy or colonoscopy. You may have a sigmoidoscopy every 5 years or a colonoscopy every 10 years starting at age 39.  Hepatitis C blood test.  Hepatitis B blood test.  Sexually transmitted disease (STD) testing.  Diabetes screening. This is done by checking your blood sugar (glucose) after you have not eaten for a while (fasting). You may have this done every 1-3 years.  Bone density scan. This is done to screen for osteoporosis. You may have this done starting at age 73.  Mammogram. This may be done  every 1-2 years. Talk to your health care provider about how often you should have regular mammograms. Talk with your health care provider about your test results, treatment options, and if necessary, the need for more tests. Vaccines  Your health care  provider may recommend certain vaccines, such as:  Influenza vaccine. This is recommended every year.  Tetanus, diphtheria, and acellular pertussis (Tdap, Td) vaccine. You may need a Td booster every 10 years.  Zoster vaccine. You may need this after age 39.  Pneumococcal 13-valent conjugate (PCV13) vaccine. One dose is recommended after age 40.  Pneumococcal polysaccharide (PPSV23) vaccine. One dose is recommended after age 107. Talk to your health care provider about which screenings and vaccines you need and how often you need them. This information is not intended to replace advice given to you by your health care provider. Make sure you discuss any questions you have with your health care provider. Document Released: 06/23/2015 Document Revised: 02/14/2016 Document Reviewed: 03/28/2015 Elsevier Interactive Patient Education  2017 [place] Prevention in the Home Falls can cause injuries. They can happen to people of all ages. There are many things you can do to make your home safe and to help prevent falls. What can I do on the outside of my home?  Regularly fix the edges of walkways and driveways and fix any cracks.  Remove anything that might make you trip as you walk through a door, such as a raised step or threshold.  Trim any bushes or trees on the path to your home.  Use bright outdoor lighting.  Clear any walking paths of anything that might make someone trip, such as rocks or tools.  Regularly check to see if handrails are loose or broken. Make sure that both sides of any steps have handrails.  Any raised decks and porches should have guardrails on the edges.  Have any leaves, snow, or ice cleared regularly.  Use sand or salt on walking paths during winter.  Clean up any spills in your garage right away. This includes oil or grease spills. What can I do in the bathroom?  Use night lights.  Install grab bars by the toilet and in the tub and shower. Do  not use towel bars as grab bars.  Use non-skid mats or decals in the tub or shower.  If you need to sit down in the shower, use a plastic, non-slip stool.  Keep the floor dry. Clean up any water that spills on the floor as soon as it happens.  Remove soap buildup in the tub or shower regularly.  Attach bath mats securely with double-sided non-slip rug tape.  Do not have throw rugs and other things on the floor that can make you trip. What can I do in the bedroom?  Use night lights.  Make sure that you have a light by your bed that is easy to reach.  Do not use any sheets or blankets that are too big for your bed. They should not hang down onto the floor.  Have a firm chair that has side arms. You can use this for support while you get dressed.  Do not have throw rugs and other things on the floor that can make you trip. What can I do in the kitchen?  Clean up any spills right away.  Avoid walking on wet floors.  Keep items that you use a lot in easy-to-reach places.  If you need to reach something above you, use a  strong step stool that has a grab bar.  Keep electrical cords out of the way.  Do not use floor polish or wax that makes floors slippery. If you must use wax, use non-skid floor wax.  Do not have throw rugs and other things on the floor that can make you trip. What can I do with my stairs?  Do not leave any items on the stairs.  Make sure that there are handrails on both sides of the stairs and use them. Fix handrails that are broken or loose. Make sure that handrails are as long as the stairways.  Check any carpeting to make sure that it is firmly attached to the stairs. Fix any carpet that is loose or worn.  Avoid having throw rugs at the top or bottom of the stairs. If you do have throw rugs, attach them to the floor with carpet tape.  Make sure that you have a light switch at the top of the stairs and the bottom of the stairs. If you do not have them,  ask someone to add them for you. What else can I do to help prevent falls?  Wear shoes that:  Do not have high heels.  Have rubber bottoms.  Are comfortable and fit you well.  Are closed at the toe. Do not wear sandals.  If you use a stepladder:  Make sure that it is fully opened. Do not climb a closed stepladder.  Make sure that both sides of the stepladder are locked into place.  Ask someone to hold it for you, if possible.  Clearly mark and make sure that you can see:  Any grab bars or handrails.  First and last steps.  Where the edge of each step is.  Use tools that help you move around (mobility aids) if they are needed. These include:  Canes.  Walkers.  Scooters.  Crutches.  Turn on the lights when you go into a dark area. Replace any light bulbs as soon as they burn out.  Set up your furniture so you have a clear path. Avoid moving your furniture around.  If any of your floors are uneven, fix them.  If there are any pets around you, be aware of where they are.  Review your medicines with your doctor. Some medicines can make you feel dizzy. This can increase your chance of falling. Ask your doctor what other things that you can do to help prevent falls. This information is not intended to replace advice given to you by your health care provider. Make sure you discuss any questions you have with your health care provider. Document Released: 03/23/2009 Document Revised: 11/02/2015 Document Reviewed: 07/01/2014 Elsevier Interactive Patient Education  2017 [employer].

## 2018-01-28 ENCOUNTER — Ambulatory Visit: Payer: Medicare Other

## 2018-02-03 ENCOUNTER — Ambulatory Visit (INDEPENDENT_AMBULATORY_CARE_PROVIDER_SITE_OTHER): Payer: Medicare Other | Admitting: Family Medicine

## 2018-02-03 ENCOUNTER — Encounter: Payer: Self-pay | Admitting: Family Medicine

## 2018-02-03 VITALS — BP 135/73 | HR 68 | Ht 61.42 in | Wt 219.0 lb

## 2018-02-03 DIAGNOSIS — I2510 Atherosclerotic heart disease of native coronary artery without angina pectoris: Secondary | ICD-10-CM | POA: Diagnosis not present

## 2018-02-03 DIAGNOSIS — C50912 Malignant neoplasm of unspecified site of left female breast: Secondary | ICD-10-CM | POA: Diagnosis not present

## 2018-02-03 DIAGNOSIS — E785 Hyperlipidemia, unspecified: Secondary | ICD-10-CM

## 2018-02-03 DIAGNOSIS — I213 ST elevation (STEMI) myocardial infarction of unspecified site: Secondary | ICD-10-CM

## 2018-02-03 DIAGNOSIS — Z7189 Other specified counseling: Secondary | ICD-10-CM | POA: Diagnosis not present

## 2018-02-03 DIAGNOSIS — I2583 Coronary atherosclerosis due to lipid rich plaque: Secondary | ICD-10-CM

## 2018-02-03 DIAGNOSIS — E118 Type 2 diabetes mellitus with unspecified complications: Secondary | ICD-10-CM

## 2018-02-03 DIAGNOSIS — E039 Hypothyroidism, unspecified: Secondary | ICD-10-CM | POA: Diagnosis not present

## 2018-02-03 DIAGNOSIS — I10 Essential (primary) hypertension: Secondary | ICD-10-CM

## 2018-02-03 DIAGNOSIS — Z170 Estrogen receptor positive status [ER+]: Secondary | ICD-10-CM | POA: Diagnosis not present

## 2018-02-03 LAB — URINALYSIS, ROUTINE W REFLEX MICROSCOPIC
Bilirubin, UA: NEGATIVE
GLUCOSE, UA: NEGATIVE
Ketones, UA: NEGATIVE
LEUKOCYTES UA: NEGATIVE
NITRITE UA: NEGATIVE
PROTEIN UA: NEGATIVE
RBC UA: NEGATIVE
SPEC GRAV UA: 1.02 (ref 1.005–1.030)
Urobilinogen, Ur: 0.2 mg/dL (ref 0.2–1.0)
pH, UA: 6 (ref 5.0–7.5)

## 2018-02-03 MED ORDER — METOPROLOL SUCCINATE ER 50 MG PO TB24: 50.0000 mg | ORAL_TABLET | Freq: Every day | ORAL | 4 refills | Status: DC

## 2018-02-03 MED ORDER — HYDROCHLOROTHIAZIDE 25 MG PO TABS: 25.0000 mg | ORAL_TABLET | Freq: Every day | ORAL | 4 refills | Status: DC

## 2018-02-03 MED ORDER — ATORVASTATIN CALCIUM 40 MG PO TABS: 40.0000 mg | ORAL_TABLET | Freq: Every day | ORAL | 4 refills | Status: DC

## 2018-02-03 MED ORDER — EZETIMIBE 10 MG PO TABS: 10.0000 mg | ORAL_TABLET | Freq: Every day | ORAL | 4 refills | Status: DC

## 2018-02-03 NOTE — Assessment & Plan Note (Signed)
The current medical regimen is effective;  continue present plan and medications.  

## 2018-02-03 NOTE — Assessment & Plan Note (Signed)
Doing well cont rx from Dr Tollie Pizza

## 2018-02-03 NOTE — Assessment & Plan Note (Signed)
Stable no sx 

## 2018-02-03 NOTE — Assessment & Plan Note (Signed)
A voluntary discussion about advanced care planning including explanation and discussion of advanced directives was extentively discussed with the patient.  Explained about the healthcare proxy and living will was reviewed and packet with forms with expiration of how to fill them out was given.  Time spent: Encounter 16+ min individuals present: Patient 

## 2018-02-03 NOTE — Progress Notes (Signed)
BP 135/73   Pulse 68   Ht 5' 1.42" (1.56 m)   Wt 219 lb (99.3 kg)   SpO2 97%   BMI 40.82 kg/m    Subjective:    Patient ID: Annette Porter, female    DOB: 1950/09/14, 67 y.o.   MRN: [id number]  HPI: Annette Porter is a 67 y.o. female  Chief Complaint  Patient presents with  . Annual Exam  Patient follow-up doing well all in all concerned about weight gain.  Had some discussions about various weight loss techniques along with exercise.  Discussed that patient feels somewhat sluggish after decreased dose from 125 to 100 mcg patient over 2 months ago restarted the 125 mcg dose of thyroid wondering what her thyroid is going to be at this point. Taking other medications without problems or issues no chest pain chest tightness good reports from breast exams from Dr. Barbette Hair office.  Relevant past medical, surgical, family and social history reviewed and updated as indicated. Interim medical history since our last visit reviewed. Allergies and medications reviewed and updated.  Review of Systems  Constitutional: Negative.   HENT: Negative.   Eyes: Negative.   Respiratory: Negative.   Cardiovascular: Negative.   Gastrointestinal: Negative.   Endocrine: Negative.   Genitourinary: Negative.   Musculoskeletal: Negative.   Skin: Negative.   Allergic/Immunologic: Negative.   Neurological: Negative.   Hematological: Negative.   Psychiatric/Behavioral: Negative.     Per HPI unless specifically indicated above     Objective:    BP 135/73   Pulse 68   Ht 5' 1.42" (1.56 m)   Wt 219 lb (99.3 kg)   SpO2 97%   BMI 40.82 kg/m   Wt Readings from Last 3 Encounters:  02/03/18 219 lb (99.3 kg)  01/26/18 217 lb 9.6 oz (98.7 kg)  10/24/17 215 lb (97.5 kg)    Physical Exam  Constitutional: She is oriented to person, place, and time. She appears well-developed and well-nourished.  HENT:  Head: Normocephalic and atraumatic.  Right Ear: External ear normal.  Left Ear: External  ear normal.  Nose: Nose normal.  Mouth/Throat: Oropharynx is clear and moist.  Eyes: Pupils are equal, round, and reactive to light. Conjunctivae and EOM are normal.  Neck: Normal range of motion. Neck supple. Carotid bruit is not present.  Cardiovascular: Normal rate, regular rhythm and normal heart sounds.  No murmur heard. Pulmonary/Chest: Effort normal and breath sounds normal.  Abdominal: Soft. Bowel sounds are normal. There is no hepatosplenomegaly.  Musculoskeletal: Normal range of motion.  Neurological: She is alert and oriented to person, place, and time.  Skin: No rash noted.  Psychiatric: She has a normal mood and affect. Her behavior is normal. Judgment and thought content normal.    Results for orders placed or performed in visit on 40/98/11  Basic metabolic panel  Result Value Ref Range   Glucose 81 65 - 99 mg/dL   BUN 17 8 - 27 mg/dL   Creatinine, Ser 0.75 0.57 - 1.00 mg/dL   GFR calc non Af Amer 83 >59 mL/min/1.73   GFR calc Af Amer 95 >59 mL/min/1.73   BUN/Creatinine Ratio 23 12 - 28   Sodium 141 134 - 144 mmol/L   Potassium 4.3 3.5 - 5.2 mmol/L   Chloride 102 96 - 106 mmol/L   CO2 25 20 - 29 mmol/L   Calcium 9.0 8.7 - 10.3 mg/dL  LP+ALT+AST Piccolo, Waived  Result Value Ref Range   ALT (SGPT) Piccolo,  Waived 30 10 - 47 U/L   AST (SGOT) Piccolo, Waived 23 11 - 38 U/L   Cholesterol Piccolo, Waived 143 <200 mg/dL   HDL Chol Piccolo, Waived 47 (L) >59 mg/dL   Triglycerides Piccolo,Waived 139 <150 mg/dL   Chol/HDL Ratio Piccolo,Waive 3.0 mg/dL   LDL Chol Calc Piccolo Waived 68 <100 mg/dL   VLDL Chol Calc Piccolo,Waive 28 <30 mg/dL  TSH  Result Value Ref Range   TSH 2.940 0.450 - 4.500 uIU/mL      Assessment & Plan:   Problem List Items Addressed This Visit      Cardiovascular and Mediastinum   Hypertension - Primary    The current medical regimen is effective;  continue present plan and medications.       Relevant Medications   atorvastatin  (LIPITOR) 40 MG tablet   ezetimibe (ZETIA) 10 MG tablet   hydrochlorothiazide (HYDRODIURIL) 25 MG tablet   metoprolol succinate (TOPROL-XL) 50 MG 24 hr tablet   Other Relevant Orders   CBC with Differential/Platelet   Comprehensive metabolic panel   Lipid panel   TSH   Urinalysis, Routine w reflex microscopic   CAD (coronary atherosclerotic disease)    The current medical regimen is effective;  continue present plan and medications.       Relevant Medications   atorvastatin (LIPITOR) 40 MG tablet   ezetimibe (ZETIA) 10 MG tablet   hydrochlorothiazide (HYDRODIURIL) 25 MG tablet   metoprolol succinate (TOPROL-XL) 50 MG 24 hr tablet   Myocardial infarction (HCC)    Stable no sx       Relevant Medications   atorvastatin (LIPITOR) 40 MG tablet   ezetimibe (ZETIA) 10 MG tablet   hydrochlorothiazide (HYDRODIURIL) 25 MG tablet   metoprolol succinate (TOPROL-XL) 50 MG 24 hr tablet     Endocrine   Hypothyroidism    Wait on lab for rx      Relevant Medications   metoprolol succinate (TOPROL-XL) 50 MG 24 hr tablet   Other Relevant Orders   CBC with Differential/Platelet   Comprehensive metabolic panel   Lipid panel   TSH   Urinalysis, Routine w reflex microscopic   RESOLVED: Type 2 diabetes mellitus (HCC)   Relevant Medications   atorvastatin (LIPITOR) 40 MG tablet     Other   Breast cancer (HCC)    Doing well cont rx from Dr Tollie Pizza      Hyperlipidemia    The current medical regimen is effective;  continue present plan and medications.       Relevant Medications   atorvastatin (LIPITOR) 40 MG tablet   ezetimibe (ZETIA) 10 MG tablet   hydrochlorothiazide (HYDRODIURIL) 25 MG tablet   metoprolol succinate (TOPROL-XL) 50 MG 24 hr tablet   Other Relevant Orders   CBC with Differential/Platelet   Comprehensive metabolic panel   Lipid panel   TSH   Urinalysis, Routine w reflex microscopic   Advanced care planning/counseling discussion    A voluntary discussion about  advanced care planning including explanation and discussion of advanced directives was extentively discussed with the patient.  Explained about the healthcare proxy and living will was reviewed and packet with forms with expiration of how to fill them out was given.  Time spent: Encounter 16+ min individuals present: Patient        Other Visit Diagnoses    Coronary artery disease due to lipid rich plaque       Relevant Medications   atorvastatin (LIPITOR) 40 MG tablet   ezetimibe (ZETIA)  10 MG tablet   hydrochlorothiazide (HYDRODIURIL) 25 MG tablet   metoprolol succinate (TOPROL-XL) 50 MG 24 hr tablet       Follow up plan: Return in about 6 months (around 08/06/2018) for BMP,  Lipids, ALT, AST.

## 2018-02-03 NOTE — Assessment & Plan Note (Signed)
Wait on lab for rx

## 2018-02-03 NOTE — Patient Instructions (Signed)
Please call [facility] @ [phone] to schedule your mammogram.   

## 2018-02-04 ENCOUNTER — Encounter: Payer: Self-pay | Admitting: Family Medicine

## 2018-02-04 LAB — COMPREHENSIVE METABOLIC PANEL
A/G RATIO: 1.5 (ref 1.2–2.2)
ALBUMIN: 4 g/dL (ref 3.6–4.8)
ALT: 23 IU/L (ref 0–32)
AST: 17 IU/L (ref 0–40)
Alkaline Phosphatase: 75 IU/L (ref 39–117)
BUN / CREAT RATIO: 22 (ref 12–28)
BUN: 17 mg/dL (ref 8–27)
Bilirubin Total: 0.3 mg/dL (ref 0.0–1.2)
CALCIUM: 9.5 mg/dL (ref 8.7–10.3)
CO2: 23 mmol/L (ref 20–29)
CREATININE: 0.79 mg/dL (ref 0.57–1.00)
Chloride: 99 mmol/L (ref 96–106)
GFR, EST AFRICAN AMERICAN: 89 mL/min/{1.73_m2} (ref >59)
GFR, EST NON AFRICAN AMERICAN: 77 mL/min/{1.73_m2} (ref >59)
GLOBULIN, TOTAL: 2.7 g/dL (ref 1.5–4.5)
Glucose: 99 mg/dL (ref 65–99)
POTASSIUM: 4.1 mmol/L (ref 3.5–5.2)
SODIUM: 138 mmol/L (ref 134–144)
TOTAL PROTEIN: 6.7 g/dL (ref 6.0–8.5)

## 2018-02-04 LAB — LIPID PANEL
Chol/HDL Ratio: 3.4 ratio (ref 0.0–4.4)
Cholesterol, Total: 145 mg/dL (ref 100–199)
HDL: 43 mg/dL (ref >39)
LDL CALC: 76 mg/dL (ref 0–99)
TRIGLYCERIDES: 130 mg/dL (ref 0–149)
VLDL Cholesterol Cal: 26 mg/dL (ref 5–40)

## 2018-02-04 LAB — CBC WITH DIFFERENTIAL/PLATELET
BASOS: 1 %
Basophils Absolute: 0 10*3/uL (ref 0.0–0.2)
EOS (ABSOLUTE): 0.2 10*3/uL (ref 0.0–0.4)
EOS: 3 %
HEMATOCRIT: 40.1 % (ref 34.0–46.6)
Hemoglobin: 13 g/dL (ref 11.1–15.9)
IMMATURE GRANS (ABS): 0 10*3/uL (ref 0.0–0.1)
IMMATURE GRANULOCYTES: 0 %
Lymphocytes Absolute: 1.6 10*3/uL (ref 0.7–3.1)
Lymphs: 21 %
MCH: 29.6 pg (ref 26.6–33.0)
MCHC: 32.4 g/dL (ref 31.5–35.7)
MCV: 91 fL (ref 79–97)
MONOS ABS: 0.9 10*3/uL (ref 0.1–0.9)
Monocytes: 11 %
NEUTROS PCT: 64 %
Neutrophils Absolute: 5 10*3/uL (ref 1.4–7.0)
Platelets: 291 10*3/uL (ref 150–450)
RBC: 4.39 x10E6/uL (ref 3.77–5.28)
RDW: 14.3 % (ref 12.3–15.4)
WBC: 7.8 10*3/uL (ref 3.4–10.8)

## 2018-02-04 LAB — TSH: TSH: 0.572 u[IU]/mL (ref 0.450–4.500)

## 2018-02-10 ENCOUNTER — Telehealth: Payer: Self-pay

## 2018-02-10 MED ORDER — LEVOTHYROXINE SODIUM 100 MCG PO TABS: 100.0000 ug | ORAL_TABLET | Freq: Every day | ORAL | 3 refills | Status: DC

## 2018-02-10 NOTE — Telephone Encounter (Signed)
Call pt Labs including thyroid are normal refill sent to [facility]

## 2018-02-10 NOTE — Telephone Encounter (Signed)
Patient stated that she feels better when she takes 125 mcg synthroid. States that you told her this was fine, but she may need to remind you. She would like to get it from [facility]

## 2018-02-10 NOTE — Telephone Encounter (Signed)
Called and spoke with patient. She is requesting her lab results. Specifically her Thyroid. Pt will need a refill done as well.

## 2018-02-11 MED ORDER — LEVOTHYROXINE SODIUM 125 MCG PO TABS: 125.0000 ug | ORAL_TABLET | Freq: Every day | ORAL | 4 refills | Status: DC

## 2018-02-11 NOTE — Telephone Encounter (Signed)
Call pt 

## 2018-02-11 NOTE — Addendum Note (Signed)
Addended by: Golden Pop A on: 02/11/2018 12:03 PM   Modules accepted: Orders

## 2018-02-11 NOTE — Telephone Encounter (Signed)
Patient was transferred to provider for telephone conversation.   

## 2018-02-11 NOTE — Telephone Encounter (Signed)
Phone call Discussed with patient blood test which is normal thyroid was done on levothyroxine 125 mcg.  We will continue this dose called into [facility].

## 2018-02-16 ENCOUNTER — Other Ambulatory Visit: Payer: Self-pay

## 2018-02-16 DIAGNOSIS — C50312 Malignant neoplasm of lower-inner quadrant of left female breast: Secondary | ICD-10-CM

## 2018-03-26 ENCOUNTER — Ambulatory Visit (INDEPENDENT_AMBULATORY_CARE_PROVIDER_SITE_OTHER): Payer: Medicare Other

## 2018-03-26 DIAGNOSIS — Z23 Encounter for immunization: Secondary | ICD-10-CM | POA: Diagnosis not present

## 2018-04-01 DIAGNOSIS — E782 Mixed hyperlipidemia: Secondary | ICD-10-CM | POA: Diagnosis not present

## 2018-04-01 DIAGNOSIS — I25118 Atherosclerotic heart disease of native coronary artery with other forms of angina pectoris: Secondary | ICD-10-CM | POA: Diagnosis not present

## 2018-04-01 DIAGNOSIS — E119 Type 2 diabetes mellitus without complications: Secondary | ICD-10-CM | POA: Diagnosis not present

## 2018-04-03 ENCOUNTER — Ambulatory Visit
Admission: RE | Admit: 2018-04-03 | Discharge: 2018-04-03 | Disposition: A | Discharge status: Home / Self Care | Payer: Medicare Other | Source: Ambulatory Visit | Attending: Family Medicine | Admitting: Family Medicine

## 2018-04-03 DIAGNOSIS — C50912 Malignant neoplasm of unspecified site of left female breast: Secondary | ICD-10-CM | POA: Diagnosis not present

## 2018-04-03 DIAGNOSIS — R928 Other abnormal and inconclusive findings on diagnostic imaging of breast: Secondary | ICD-10-CM | POA: Diagnosis not present

## 2018-04-03 DIAGNOSIS — Z170 Estrogen receptor positive status [ER+]: Secondary | ICD-10-CM | POA: Diagnosis not present

## 2018-04-03 DIAGNOSIS — Z853 Personal history of malignant neoplasm of breast: Secondary | ICD-10-CM | POA: Diagnosis not present

## 2018-04-09 ENCOUNTER — Encounter: Payer: Self-pay | Admitting: General Surgery

## 2018-04-09 ENCOUNTER — Other Ambulatory Visit: Payer: Self-pay

## 2018-04-09 ENCOUNTER — Ambulatory Visit (INDEPENDENT_AMBULATORY_CARE_PROVIDER_SITE_OTHER): Payer: Medicare Other | Admitting: General Surgery

## 2018-04-09 VITALS — BP 108/68 | HR 68 | Temp 97.7°F | Resp 14 | Ht 62.0 in | Wt 217.0 lb

## 2018-04-09 DIAGNOSIS — I2583 Coronary atherosclerosis due to lipid rich plaque: Secondary | ICD-10-CM

## 2018-04-09 DIAGNOSIS — I2510 Atherosclerotic heart disease of native coronary artery without angina pectoris: Secondary | ICD-10-CM

## 2018-04-09 DIAGNOSIS — C50312 Malignant neoplasm of lower-inner quadrant of left female breast: Secondary | ICD-10-CM

## 2018-04-09 NOTE — Progress Notes (Signed)
Patient ID: Annette Porter, female   DOB: [birth year], 67 y.o.   MRN: [id number]  Chief Complaint  Patient presents with  . Follow-up    HPI Annette Porter is a 67 y.o. female who presents for a breast evaluation. The most recent mammogram was done on 04/03/2018. Marland Kitchen  Patient does perform regular self breast checks and gets regular mammograms done.    HPI  Past Medical History:  Diagnosis Date  . 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 CAD (coronary artery disease)   . GERD (gastroesophageal reflux disease)   . Hyperlipidemia   . Hypertension   . Hypothyroidism   . Morbid obesity ([place])   . Myocardial infarction ([place]) 07/1998  . Personal history of radiation therapy 2014 and 2015   mammostite for breast ca    Past Surgical History:  Procedure Laterality Date  . BREAST BIOPSY Left 03/22/13    DCIS at 3:00  . BREAST BIOPSY Left 11/11/13   INVASIVE MAMMARY CARCINOMA at 8:00  . BREAST LUMPECTOMY Left 04/29/2013   High grade DCIS, 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  Marland Kitchen BREAST LUMPECTOMY Left 12/16/2013   11 mm ER/ PR positive, Her 2 neu negative, T1c, N0. Mammosite radiation.   Marland Kitchen BREAST LUMPECTOMY WITH MAMMOSITE CAVITY EVALUATION DEVICE Left 2014  . BREAST LUMPECTOMY WITH MAMMOSITE CAVITY EVALUATION DEVICE Left 2015  . BREAST MAMMOSITE  05/20/13  . BREAST SURGERY Left 04/29/13   wide excision  . BREAST SURGERY Left 12-16-13   Wide excision with SN biopsy, INVASIVE MAMMARY CARCINOMA  . CARDIAC CATHETERIZATION    . COLONOSCOPY  2006  . COLONOSCOPY WITH PROPOFOL N/A 05/31/2015   Procedure: COLONOSCOPY WITH PROPOFOL;  Surgeon: Robert Bellow, MD;  Location: [facility] ENDOSCOPY;  Service: Endoscopy;  Laterality: N/A;  . HAND SURGERY    . KNEE ARTHROSCOPY WITH MEDIAL MENISECTOMY Left 02/24/2015   Procedure: KNEE ARTHROSCOPY WITH PARTIAL MEDIAL MENISECTOMY;   Surgeon: Christophe Louis, MD;  Location: [facility] ORS;  Service: Orthopedics;  Laterality: Left;    Family History  Problem Relation Age of Onset  . Brain cancer Father   . Thyroid disease Sister   . Diabetes Maternal Uncle   . Thyroid disease Paternal Aunt   . Thyroid disease Sister   . Thyroid disease Sister   . Breast cancer Neg Hx     Social History Social History   Tobacco Use  . Smoking status: Never Smoker  . Smokeless tobacco: Never Used  Substance Use Topics  . Alcohol use: No  . Drug use: No    Allergies  Allergen Reactions  . Benazepril Cough  . Dilaudid [Hydromorphone] Nausea And Vomiting  . Hydrocodone Itching  . Tape Other (See Comments)    irratation    Current Outpatient Medications  Medication Sig Dispense Refill  . aspirin 81 MG tablet Take 81 mg by mouth daily.    Marland Kitchen atorvastatin (LIPITOR) 40 MG tablet Take 1 tablet (40 mg total) by mouth daily. 90 tablet 4  . Calcium Carbonate (CALCIUM 600 PO) Take 1 tablet by mouth 2 (two) times daily.    . Cholecalciferol (VITAMIN D3) 1000 UNITS CAPS Take 1 capsule by mouth daily.    Marland Kitchen ezetimibe (ZETIA) 10 MG tablet Take 1 tablet (10 mg total) by mouth daily. 90 tablet 4  . hydrochlorothiazide (HYDRODIURIL) 25 MG tablet Take 1  tablet (25 mg total) by mouth daily. 90 tablet 4  . ibuprofen (ADVIL,MOTRIN) 800 MG tablet Take 1 tablet (800 mg total) by mouth 3 (three) times daily as needed. 270 tablet 0  . isosorbide mononitrate (IMDUR) 30 MG 24 hr tablet Take 30 mg by mouth daily.    Marland Kitchen letrozole (FEMARA) 2.5 MG tablet TAKE ONE TABLET BY MOUTH ONCE DAILY 90 tablet 3  . levothyroxine (SYNTHROID, LEVOTHROID) 125 MCG tablet Take 1 tablet (125 mcg total) by mouth daily. 90 tablet 4  . metoprolol succinate (TOPROL-XL) 50 MG 24 hr tablet Take 1 tablet (50 mg total) by mouth daily. 90 tablet 4  . nitroGLYCERIN (NITROSTAT) 0.4 MG SL tablet Place 0.4 mg under the tongue every 5 (five) minutes as needed for chest pain.    .  ranitidine (ZANTAC) 150 MG tablet Take 150 mg by mouth at bedtime.     . vitamin B-12 (CYANOCOBALAMIN) 250 MCG tablet Take 500 mcg by mouth daily.      No current facility-administered medications for this visit.     Review of Systems Review of Systems  Constitutional: Negative.   Eyes: Negative.   Respiratory: Negative.   Cardiovascular: Negative.     Blood pressure 108/68, pulse 68, temperature 97.7 F (36.5 C), temperature source Skin, resp. rate 14, height 5\' 2"  (1.575 m), weight 217 lb (98.4 kg), SpO2 98 %.  Physical Exam Physical Exam  Constitutional: She is oriented to person, place, and time. She appears well-developed and well-nourished.  Eyes: Conjunctivae are normal. No scleral icterus.  Neck: Neck supple.  Cardiovascular: Normal rate, regular rhythm and normal heart sounds.  Pulmonary/Chest: Effort normal and breath sounds normal. Right breast exhibits no inverted nipple, no mass, no nipple discharge, no skin change and no tenderness. Left breast exhibits no inverted nipple, no mass, no nipple discharge, no skin change and no tenderness.    Lymphadenopathy:    She has no cervical adenopathy.  Neurological: She is alert and oriented to person, place, and time.  Skin: Skin is warm and dry.    Data Reviewed Bilateral diagnostic mammogram dated April 03, 2018 was reviewed.  Postsurgical changes.  BI-RADS-2.  A seroma was associated with the lateral side at 3:00, unchanged over past several years.  Assessment    No evidence of recurrent cancer.    Plan  The patient has been asked to return to the office in one year with a bilateral diagnostic mammogram.The patient is aware to call back for any questions or concerns.  The patient will be a candidate for BCI testing at her next visit if she would be amenable to continuing antiestrogen therapy if testing showed it would be of benefit.  HPI, Physical Exam, Assessment and Plan have been scribed under the direction  and in the presence of Hervey Ard, MD.  Gaspar Cola, CMA  Annette Porter 04/09/2018, 8:49 PM

## 2018-04-09 NOTE — Patient Instructions (Signed)
The patient has been asked to return to the office in one year with a bilateral diagnostic mammogram.The patient is aware to call back for any questions or concerns. 

## 2018-04-16 ENCOUNTER — Ambulatory Visit: Payer: Medicare Other | Admitting: General Surgery

## 2018-04-27 ENCOUNTER — Other Ambulatory Visit: Payer: Self-pay

## 2018-04-27 MED ORDER — IBUPROFEN 800 MG PO TABS: 800.0000 mg | ORAL_TABLET | Freq: Three times a day (TID) | ORAL | 0 refills | Status: DC | PRN

## 2018-04-27 NOTE — Telephone Encounter (Signed)
Fax from [facility].  Request for Ibuprofen tablets.  Upcoming appt 08/06/2018

## 2018-06-04 ENCOUNTER — Encounter: Payer: Self-pay | Admitting: Family Medicine

## 2018-08-06 ENCOUNTER — Ambulatory Visit: Payer: Medicare Other | Admitting: Family Medicine

## 2018-08-10 ENCOUNTER — Other Ambulatory Visit: Payer: Self-pay | Admitting: Family Medicine

## 2018-08-11 ENCOUNTER — Ambulatory Visit: Payer: Medicare Other | Admitting: Family Medicine

## 2018-08-17 ENCOUNTER — Ambulatory Visit (INDEPENDENT_AMBULATORY_CARE_PROVIDER_SITE_OTHER): Payer: Medicare Other | Admitting: Family Medicine

## 2018-08-17 ENCOUNTER — Encounter: Payer: Self-pay | Admitting: Family Medicine

## 2018-08-17 ENCOUNTER — Other Ambulatory Visit: Payer: Self-pay

## 2018-08-17 VITALS — BP 117/69 | HR 62 | Temp 98.1°F | Ht 61.0 in | Wt 218.0 lb

## 2018-08-17 DIAGNOSIS — M7071 Other bursitis of hip, right hip: Secondary | ICD-10-CM

## 2018-08-17 DIAGNOSIS — C50912 Malignant neoplasm of unspecified site of left female breast: Secondary | ICD-10-CM | POA: Diagnosis not present

## 2018-08-17 DIAGNOSIS — Z170 Estrogen receptor positive status [ER+]: Secondary | ICD-10-CM | POA: Diagnosis not present

## 2018-08-17 DIAGNOSIS — I2510 Atherosclerotic heart disease of native coronary artery without angina pectoris: Secondary | ICD-10-CM | POA: Diagnosis not present

## 2018-08-17 DIAGNOSIS — I10 Essential (primary) hypertension: Secondary | ICD-10-CM | POA: Diagnosis not present

## 2018-08-17 DIAGNOSIS — E039 Hypothyroidism, unspecified: Secondary | ICD-10-CM | POA: Diagnosis not present

## 2018-08-17 DIAGNOSIS — I2583 Coronary atherosclerosis due to lipid rich plaque: Secondary | ICD-10-CM | POA: Diagnosis not present

## 2018-08-17 DIAGNOSIS — E785 Hyperlipidemia, unspecified: Secondary | ICD-10-CM | POA: Diagnosis not present

## 2018-08-17 DIAGNOSIS — E118 Type 2 diabetes mellitus with unspecified complications: Secondary | ICD-10-CM | POA: Diagnosis not present

## 2018-08-17 LAB — MICROALBUMIN, URINE WAIVED
Creatinine, Urine Waived: 50 mg/dL (ref 10–300)
MICROALB, UR WAIVED: 10 mg/L (ref 0–19)

## 2018-08-17 LAB — LP+ALT+AST PICCOLO, WAIVED
ALT (SGPT) PICCOLO, WAIVED: 25 U/L (ref 10–47)
AST (SGOT) PICCOLO, WAIVED: 28 U/L (ref 11–38)
CHOLESTEROL PICCOLO, WAIVED: 133 mg/dL (ref <200)
Chol/HDL Ratio Piccolo,Waive: 2.9 mg/dL
HDL Chol Piccolo, Waived: 45 mg/dL — ABNORMAL LOW (ref >59)
LDL Chol Calc Piccolo Waived: 61 mg/dL (ref <100)
TRIGLYCERIDES PICCOLO,WAIVED: 135 mg/dL (ref <150)
VLDL Chol Calc Piccolo,Waive: 27 mg/dL (ref <30)

## 2018-08-17 LAB — BAYER DCA HB A1C WAIVED: HB A1C: 6.1 % (ref <7.0)

## 2018-08-17 MED ORDER — MELOXICAM 7.5 MG PO TABS: 7.5000 mg | ORAL_TABLET | Freq: Every day | ORAL | 5 refills | Status: DC

## 2018-08-17 NOTE — Assessment & Plan Note (Signed)
The current medical regimen is effective;  continue present plan and medications.  

## 2018-08-17 NOTE — Progress Notes (Signed)
BP 117/69   Pulse 62   Temp 98.1 F (36.7 C) (Oral)   Ht 5\' 1"  (1.549 m)   Wt 218 lb (98.9 kg)   SpO2 96%   BMI 41.19 kg/m    Subjective:    Patient ID: Annette Porter, female    DOB: 01/15/1951, 68 y.o.   MRN: [id number]  HPI: Annette Porter is a 68 y.o. female  Chief Complaint  Patient presents with  . Hypertension    78m f/u  . Hyperlipidemia   Patient all in all doing well has no complaints with prediabetes not taking any medications and struggling with weight loss diet nutrition and exercise. Blood pressure cholesterol though doing well. No heart issues getting good reports from cardiology. Breast cancer also good reports taking Femara without problems.  Relevant past medical, surgical, family and social history reviewed and updated as indicated. Interim medical history since our last visit reviewed. Allergies and medications reviewed and updated.  Review of Systems  Constitutional: Negative.   Respiratory: Negative.   Cardiovascular: Negative.     Per HPI unless specifically indicated above     Objective:    BP 117/69   Pulse 62   Temp 98.1 F (36.7 C) (Oral)   Ht 5\' 1"  (1.549 m)   Wt 218 lb (98.9 kg)   SpO2 96%   BMI 41.19 kg/m   Wt Readings from Last 3 Encounters:  08/17/18 218 lb (98.9 kg)  04/09/18 217 lb (98.4 kg)  02/03/18 219 lb (99.3 kg)    Physical Exam Constitutional:      Appearance: She is well-developed.  HENT:     Head: Normocephalic and atraumatic.  Eyes:     Conjunctiva/sclera: Conjunctivae normal.  Neck:     Musculoskeletal: Normal range of motion.  Cardiovascular:     Rate and Rhythm: Normal rate and regular rhythm.     Heart sounds: Normal heart sounds.  Pulmonary:     Effort: Pulmonary effort is normal.     Breath sounds: Normal breath sounds.  Musculoskeletal: Normal range of motion.  Skin:    Findings: No erythema.  Neurological:     Mental Status: She is alert and oriented to person, place, and time.    Psychiatric:        Behavior: Behavior normal.        Thought Content: Thought content normal.        Judgment: Judgment normal.     Results for orders placed or performed in visit on 02/03/18  CBC with Differential/Platelet  Result Value Ref Range   WBC 7.8 3.4 - 10.8 x10E3/uL   RBC 4.39 3.77 - 5.28 x10E6/uL   Hemoglobin 13.0 11.1 - 15.9 g/dL   Hematocrit 40.1 34.0 - 46.6 %   MCV 91 79 - 97 fL   MCH 29.6 26.6 - 33.0 pg   MCHC 32.4 31.5 - 35.7 g/dL   RDW 14.3 12.3 - 15.4 %   Platelets 291 150 - 450 x10E3/uL   Neutrophils 64 Not Estab. %   Lymphs 21 Not Estab. %   Monocytes 11 Not Estab. %   Eos 3 Not Estab. %   Basos 1 Not Estab. %   Neutrophils Absolute 5.0 1.4 - 7.0 x10E3/uL   Lymphocytes Absolute 1.6 0.7 - 3.1 x10E3/uL   Monocytes Absolute 0.9 0.1 - 0.9 x10E3/uL   EOS (ABSOLUTE) 0.2 0.0 - 0.4 x10E3/uL   Basophils Absolute 0.0 0.0 - 0.2 x10E3/uL   Immature Granulocytes 0 Not  Estab. %   Immature Grans (Abs) 0.0 0.0 - 0.1 x10E3/uL  Comprehensive metabolic panel  Result Value Ref Range   Glucose 99 65 - 99 mg/dL   BUN 17 8 - 27 mg/dL   Creatinine, Ser 0.79 0.57 - 1.00 mg/dL   GFR calc non Af Amer 77 >59 mL/min/1.73   GFR calc Af Amer 89 >59 mL/min/1.73   BUN/Creatinine Ratio 22 12 - 28   Sodium 138 134 - 144 mmol/L   Potassium 4.1 3.5 - 5.2 mmol/L   Chloride 99 96 - 106 mmol/L   CO2 23 20 - 29 mmol/L   Calcium 9.5 8.7 - 10.3 mg/dL   Total Protein 6.7 6.0 - 8.5 g/dL   Albumin 4.0 3.6 - 4.8 g/dL   Globulin, Total 2.7 1.5 - 4.5 g/dL   Albumin/Globulin Ratio 1.5 1.2 - 2.2   Bilirubin Total 0.3 0.0 - 1.2 mg/dL   Alkaline Phosphatase 75 39 - 117 IU/L   AST 17 0 - 40 IU/L   ALT 23 0 - 32 IU/L  Lipid panel  Result Value Ref Range   Cholesterol, Total 145 100 - 199 mg/dL   Triglycerides 130 0 - 149 mg/dL   HDL 43 >39 mg/dL   VLDL Cholesterol Cal 26 5 - 40 mg/dL   LDL Calculated 76 0 - 99 mg/dL   Chol/HDL Ratio 3.4 0.0 - 4.4 ratio  TSH  Result Value Ref Range    TSH 0.572 0.450 - 4.500 uIU/mL  Urinalysis, Routine w reflex microscopic  Result Value Ref Range   Specific Gravity, UA 1.020 1.005 - 1.030   pH, UA 6.0 5.0 - 7.5   Color, UA Yellow Yellow   Appearance Ur Clear Clear   Leukocytes, UA Negative Negative   Protein, UA Negative Negative/Trace   Glucose, UA Negative Negative   Ketones, UA Negative Negative   RBC, UA Negative Negative   Bilirubin, UA Negative Negative   Urobilinogen, Ur 0.2 0.2 - 1.0 mg/dL   Nitrite, UA Negative Negative      Assessment & Plan:   Problem List Items Addressed This Visit      Cardiovascular and Mediastinum   Hypertension - Primary    The current medical regimen is effective;  continue present plan and medications.       Relevant Orders   Basic metabolic panel   LP+ALT+AST Piccolo, Waived   Bayer DCA Hb A1c Waived   Microalbumin, Urine Waived   CAD (coronary atherosclerotic disease)    The current medical regimen is effective;  continue present plan and medications.         Endocrine   Hypothyroidism    The current medical regimen is effective;  continue present plan and medications.         Musculoskeletal and Integument   Bursitis of hip, right    Discuss care and tx meloxicam        Other   Breast cancer ([place])    The current medical regimen is effective;  continue present plan and medications.       Hyperlipidemia    The current medical regimen is effective;  continue present plan and medications.       Relevant Orders   Basic metabolic panel   LP+ALT+AST Piccolo, Waived   Bayer DCA Hb A1c Waived   Microalbumin, Urine Waived   Morbid obesity (HCC)    Discuss wt loss           Follow up plan: Return in  about 6 months (around 02/17/2019) for Physical Exam.

## 2018-08-17 NOTE — Assessment & Plan Note (Signed)
Discuss care and tx meloxicam

## 2018-08-17 NOTE — Assessment & Plan Note (Signed)
Discuss wt loss

## 2018-08-18 ENCOUNTER — Encounter: Payer: Self-pay | Admitting: Family Medicine

## 2018-08-18 LAB — BASIC METABOLIC PANEL
BUN / CREAT RATIO: 19 (ref 12–28)
BUN: 15 mg/dL (ref 8–27)
CALCIUM: 9.6 mg/dL (ref 8.7–10.3)
CO2: 25 mmol/L (ref 20–29)
CREATININE: 0.78 mg/dL (ref 0.57–1.00)
Chloride: 98 mmol/L (ref 96–106)
GFR, EST AFRICAN AMERICAN: 90 mL/min/{1.73_m2} (ref >59)
GFR, EST NON AFRICAN AMERICAN: 78 mL/min/{1.73_m2} (ref >59)
Glucose: 98 mg/dL (ref 65–99)
Potassium: 4.3 mmol/L (ref 3.5–5.2)
Sodium: 136 mmol/L (ref 134–144)

## 2018-09-23 DIAGNOSIS — E782 Mixed hyperlipidemia: Secondary | ICD-10-CM | POA: Diagnosis not present

## 2018-09-23 DIAGNOSIS — R0602 Shortness of breath: Secondary | ICD-10-CM | POA: Diagnosis not present

## 2018-09-23 DIAGNOSIS — E119 Type 2 diabetes mellitus without complications: Secondary | ICD-10-CM | POA: Diagnosis not present

## 2018-09-23 DIAGNOSIS — I25118 Atherosclerotic heart disease of native coronary artery with other forms of angina pectoris: Secondary | ICD-10-CM | POA: Diagnosis not present

## 2018-09-23 DIAGNOSIS — I480 Paroxysmal atrial fibrillation: Secondary | ICD-10-CM | POA: Diagnosis not present

## 2018-09-28 ENCOUNTER — Ambulatory Visit: Payer: Medicare Other | Admitting: Radiation Oncology

## 2018-10-08 DIAGNOSIS — R0602 Shortness of breath: Secondary | ICD-10-CM | POA: Diagnosis not present

## 2018-10-08 DIAGNOSIS — I25118 Atherosclerotic heart disease of native coronary artery with other forms of angina pectoris: Secondary | ICD-10-CM | POA: Diagnosis not present

## 2018-10-13 DIAGNOSIS — I25118 Atherosclerotic heart disease of native coronary artery with other forms of angina pectoris: Secondary | ICD-10-CM | POA: Diagnosis not present

## 2018-11-27 ENCOUNTER — Other Ambulatory Visit: Payer: Self-pay

## 2018-11-30 ENCOUNTER — Other Ambulatory Visit: Payer: Self-pay

## 2018-11-30 ENCOUNTER — Encounter: Payer: Self-pay | Admitting: Radiation Oncology

## 2018-11-30 ENCOUNTER — Ambulatory Visit
Admission: RE | Admit: 2018-11-30 | Discharge: 2018-11-30 | Disposition: A | Discharge status: Home / Self Care | Payer: Medicare Other | Source: Ambulatory Visit | Attending: Radiation Oncology | Admitting: Radiation Oncology

## 2018-11-30 VITALS — BP 162/87 | HR 65 | Temp 96.9°F | Resp 18 | Wt 215.8 lb

## 2018-11-30 DIAGNOSIS — Z923 Personal history of irradiation: Secondary | ICD-10-CM | POA: Diagnosis not present

## 2018-11-30 DIAGNOSIS — C50312 Malignant neoplasm of lower-inner quadrant of left female breast: Secondary | ICD-10-CM | POA: Diagnosis not present

## 2018-11-30 DIAGNOSIS — Z79818 Long term (current) use of other agents affecting estrogen receptors and estrogen levels: Secondary | ICD-10-CM | POA: Diagnosis not present

## 2018-11-30 DIAGNOSIS — Z170 Estrogen receptor positive status [ER+]: Secondary | ICD-10-CM | POA: Insufficient documentation

## 2018-11-30 NOTE — Progress Notes (Signed)
Radiation Oncology Follow up Note  Name: Annette Porter   Date:   11/30/2018 MRN:  [id number] DOB: [birth year]    This 68 y.o. 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  Seen today in routine follow-up she is doing well.  She specifically denies breast tenderness cough or bone pain.  Still some occasional problems with scar tissue in the left breast although that is resolving.  She is currently on.  Femara tolerating that well without side effect.  Last mammogram was back in October which I have reviewed was BI-RADS 2 benign.  COMPLICATIONS OF TREATMENT: none  FOLLOW UP COMPLIANCE: keeps appointments   PHYSICAL EXAM:  BP (!) 162/87   Pulse 65   Temp (!) 96.9 F (36.1 C)   Resp 18   Wt 215 lb 13.3 oz (97.9 kg)   BMI 40.78 kg/m  Lungs are clear to A&P cardiac examination essentially unremarkable with regular rate and rhythm. No dominant mass or nodularity is noted in either breast in 2 positions examined. Incision is well-healed. No axillary or supraclavicular adenopathy is appreciated. Cosmetic result is excellent.  Well-developed well-nourished patient in NAD. HEENT reveals PERLA, EOMI, discs not visualized.  Oral cavity is clear. No oral mucosal lesions are identified. Neck is clear without evidence of cervical or supraclavicular adenopathy. Lungs are clear to A&P. Cardiac examination is essentially unremarkable with regular rate and rhythm without murmur rub or thrill. Abdomen is benign with no organomegaly or masses noted. Motor sensory and DTR levels are equal and symmetric in the upper and lower extremities. Cranial nerves II through XII are grossly intact.  Proprioception is intact. No peripheral adenopathy or edema is identified. No motor or sensory levels are noted. Crude visual fields are within normal range.  RADIOLOGY RESULTS: Mammograms reviewed and compatible with above-stated findings  PLAN: Present time patient is doing well with no evidence of disease now out 4-1/2 years.  I am going to discontinue follow-up care.  I have asked the patient's primary care doctor to make sure mammograms are ordered.  Patient knows to call me at anytime with any concerns at any time.  She continues on Femara at this time.  I would like to take this opportunity to thank you for allowing me to participate in the care of your patient.Noreene Filbert, MD

## 2018-12-06 ENCOUNTER — Other Ambulatory Visit: Payer: Self-pay | Admitting: Family Medicine

## 2018-12-06 NOTE — Telephone Encounter (Signed)
Requested Prescriptions  Pending Prescriptions Disp Refills  . ibuprofen (ADVIL) 800 MG tablet [Pharmacy Med Name: IBUPROFEN  800MG   TAB] 270 tablet 0    Sig: TAKE 1 TABLET BY MOUTH 3  TIMES DAILY AS NEEDED     Analgesics:  NSAIDS Passed - 12/06/2018  5:48 AM      Passed - Cr in normal range and within 360 days    Creatinine  Date Value Ref Range Status  04/21/2013 0.81 0.60 - 1.30 mg/dL Final   Creatinine, Ser  Date Value Ref Range Status  08/17/2018 0.78 0.57 - 1.00 mg/dL Final         Passed - HGB in normal range and within 360 days    Hemoglobin  Date Value Ref Range Status  02/03/2018 13.0 11.1 - 15.9 g/dL Final         Passed - Patient is not pregnant      Passed - Valid encounter within last 12 months    Recent Outpatient Visits          3 months ago Essential hypertension   [place] [facility], Jeannette How, MD   10 months ago Essential hypertension   [place] [facility], Jeannette How, MD   1 year ago Essential hypertension   [facility], Jeannette How, MD   1 year ago Acute sinusitis, recurrence not specified, unspecified location   [facility], Jeannette How, MD   1 year ago Essential hypertension   [place], Jeannette How, MD      Future Appointments            In 2 months  [place], [place]   In 2 months [facility], Jeannette How, MD [facility], [place]

## 2018-12-28 DIAGNOSIS — I25118 Atherosclerotic heart disease of native coronary artery with other forms of angina pectoris: Secondary | ICD-10-CM | POA: Diagnosis not present

## 2018-12-28 DIAGNOSIS — E782 Mixed hyperlipidemia: Secondary | ICD-10-CM | POA: Diagnosis not present

## 2018-12-28 DIAGNOSIS — E119 Type 2 diabetes mellitus without complications: Secondary | ICD-10-CM | POA: Diagnosis not present

## 2018-12-29 ENCOUNTER — Encounter: Payer: Self-pay | Admitting: General Surgery

## 2019-02-01 ENCOUNTER — Ambulatory Visit: Payer: Medicare Other

## 2019-02-02 ENCOUNTER — Other Ambulatory Visit: Payer: Self-pay

## 2019-02-02 DIAGNOSIS — C50312 Malignant neoplasm of lower-inner quadrant of left female breast: Secondary | ICD-10-CM

## 2019-02-10 ENCOUNTER — Ambulatory Visit (INDEPENDENT_AMBULATORY_CARE_PROVIDER_SITE_OTHER): Payer: Medicare Other | Admitting: Family Medicine

## 2019-02-10 ENCOUNTER — Other Ambulatory Visit: Payer: Self-pay

## 2019-02-10 ENCOUNTER — Encounter: Payer: Self-pay | Admitting: Family Medicine

## 2019-02-10 DIAGNOSIS — Z7189 Other specified counseling: Secondary | ICD-10-CM | POA: Diagnosis not present

## 2019-02-10 DIAGNOSIS — E785 Hyperlipidemia, unspecified: Secondary | ICD-10-CM

## 2019-02-10 DIAGNOSIS — I2510 Atherosclerotic heart disease of native coronary artery without angina pectoris: Secondary | ICD-10-CM | POA: Diagnosis not present

## 2019-02-10 DIAGNOSIS — I10 Essential (primary) hypertension: Secondary | ICD-10-CM

## 2019-02-10 DIAGNOSIS — E039 Hypothyroidism, unspecified: Secondary | ICD-10-CM

## 2019-02-10 DIAGNOSIS — I2583 Coronary atherosclerosis due to lipid rich plaque: Secondary | ICD-10-CM

## 2019-02-10 MED ORDER — HYDROCHLOROTHIAZIDE 25 MG PO TABS: 25.0000 mg | ORAL_TABLET | Freq: Every day | ORAL | 4 refills | Status: DC

## 2019-02-10 MED ORDER — LEVOTHYROXINE SODIUM 125 MCG PO TABS: 125.0000 ug | ORAL_TABLET | Freq: Every day | ORAL | 4 refills | Status: DC

## 2019-02-10 MED ORDER — ATORVASTATIN CALCIUM 40 MG PO TABS: 40.0000 mg | ORAL_TABLET | Freq: Every day | ORAL | 4 refills | Status: DC

## 2019-02-10 MED ORDER — EZETIMIBE 10 MG PO TABS: 10.0000 mg | ORAL_TABLET | Freq: Every day | ORAL | 4 refills | Status: DC

## 2019-02-10 MED ORDER — METOPROLOL SUCCINATE ER 50 MG PO TB24: 50.0000 mg | ORAL_TABLET | Freq: Every day | ORAL | 4 refills | Status: DC

## 2019-02-10 NOTE — Assessment & Plan Note (Signed)
The current medical regimen is effective;  continue present plan and medications.  

## 2019-02-10 NOTE — Progress Notes (Signed)
BP (!) 154/77   Wt 213 lb (96.6 kg)   BMI 40.25 kg/m    Subjective:    Patient ID: Annette Porter, female    DOB: 03-05-1951, 68 y.o.   MRN: [id number]  HPI: Annette Porter is a 68 y.o. female  Med check Discussed with patient concerned about blood pressure last several readings have been elevated.  Patient's previously had very good blood pressure readings with no problems taking medications faithfully without any issues. Cholesterol thyroid all doing well no breast cancer issues  Relevant past medical, surgical, family and social history reviewed and updated as indicated. Interim medical history since our last visit reviewed. Allergies and medications reviewed and updated.  Review of Systems  Constitutional: Negative.   HENT: Negative.   Eyes: Negative.   Respiratory: Negative.   Cardiovascular: Negative.   Gastrointestinal: Negative.   Endocrine: Negative.   Genitourinary: Negative.   Musculoskeletal: Negative.   Skin: Negative.   Allergic/Immunologic: Negative.   Neurological: Negative.   Hematological: Negative.   Psychiatric/Behavioral: Negative.     Per HPI unless specifically indicated above     Objective:    BP (!) 154/77   Wt 213 lb (96.6 kg)   BMI 40.25 kg/m   Wt Readings from Last 3 Encounters:  02/10/19 213 lb (96.6 kg)  11/30/18 215 lb 13.3 oz (97.9 kg)  08/17/18 218 lb (98.9 kg)    Physical Exam  Results for orders placed or performed in visit on [id number]  Basic metabolic panel  Result Value Ref Range   Glucose 98 65 - 99 mg/dL   BUN 15 8 - 27 mg/dL   Creatinine, Ser 0.78 0.57 - 1.00 mg/dL   GFR calc non Af Amer 78 >59 mL/min/1.73   GFR calc Af Amer 90 >59 mL/min/1.73   BUN/Creatinine Ratio 19 12 - 28   Sodium 136 134 - 144 mmol/L   Potassium 4.3 3.5 - 5.2 mmol/L   Chloride 98 96 - 106 mmol/L   CO2 25 20 - 29 mmol/L   Calcium 9.6 8.7 - 10.3 mg/dL  LP+ALT+AST Piccolo, Waived  Result Value Ref Range   ALT (SGPT) Piccolo, Waived 25  10 - 47 U/L   AST (SGOT) Piccolo, Waived 28 11 - 38 U/L   Cholesterol Piccolo, Waived 133 <200 mg/dL   HDL Chol Piccolo, Waived 45 (L) >59 mg/dL   Triglycerides Piccolo,Waived 135 <150 mg/dL   Chol/HDL Ratio Piccolo,Waive 2.9 mg/dL   LDL Chol Calc Piccolo Waived 61 <100 mg/dL   VLDL Chol Calc Piccolo,Waive 27 <30 mg/dL  Bayer DCA Hb A1c Waived  Result Value Ref Range   HB A1C (BAYER DCA - WAIVED) 6.1 <7.0 %  Microalbumin, Urine Waived  Result Value Ref Range   Microalb, Ur Waived 10 0 - 19 mg/L   Creatinine, Urine Waived 50 10 - 300 mg/dL   Microalb/Creat Ratio 30-300 (H) <30 mg/g      Assessment & Plan:   Problem List Items Addressed This Visit      Cardiovascular and Mediastinum   Hypertension    Discussed hypertension and recheck blood pressures if remains elevated will contact us and/or increase metoprolol from 50 mg to 100 mg.      Relevant Medications   atorvastatin (LIPITOR) 40 MG tablet   metoprolol succinate (TOPROL-XL) 50 MG 24 hr tablet   ezetimibe (ZETIA) 10 MG tablet   hydrochlorothiazide (HYDRODIURIL) 25 MG tablet   Other Relevant Orders   Comprehensive metabolic panel  CBC with Differential/Platelet   TSH   Urinalysis, Routine w reflex microscopic   CAD (coronary atherosclerotic disease)    The current medical regimen is effective;  continue present plan and medications.       Relevant Medications   atorvastatin (LIPITOR) 40 MG tablet   metoprolol succinate (TOPROL-XL) 50 MG 24 hr tablet   ezetimibe (ZETIA) 10 MG tablet   hydrochlorothiazide (HYDRODIURIL) 25 MG tablet     Endocrine   Hypothyroidism    The current medical regimen is effective;  continue present plan and medications.       Relevant Medications   metoprolol succinate (TOPROL-XL) 50 MG 24 hr tablet   levothyroxine (SYNTHROID) 125 MCG tablet     Other   Hyperlipidemia    The current medical regimen is effective;  continue present plan and medications.       Relevant  Medications   atorvastatin (LIPITOR) 40 MG tablet   metoprolol succinate (TOPROL-XL) 50 MG 24 hr tablet   ezetimibe (ZETIA) 10 MG tablet   hydrochlorothiazide (HYDRODIURIL) 25 MG tablet   Other Relevant Orders   Lipid panel   CBC with Differential/Platelet   TSH   Urinalysis, Routine w reflex microscopic    Other Visit Diagnoses    Coronary artery disease due to lipid rich plaque       Relevant Medications   atorvastatin (LIPITOR) 40 MG tablet   metoprolol succinate (TOPROL-XL) 50 MG 24 hr tablet   ezetimibe (ZETIA) 10 MG tablet   hydrochlorothiazide (HYDRODIURIL) 25 MG tablet   Other Relevant Orders   Comprehensive metabolic panel   CBC with Differential/Platelet   TSH   Urinalysis, Routine w reflex microscopic      Telemedicine using audio/video telecommunications for a synchronous communication visit. Today's visit due to COVID-19 isolation precautions I connected with and verified that I am speaking with the correct person using two identifiers.   I discussed the limitations, risks, security and privacy concerns of performing an evaluation and management service by telecommunication and the availability of in person appointments. I also discussed with the patient that there may be a patient responsible charge related to this service. The patient expressed understanding and agreed to proceed. The patient's location is home. I am at home.   I discussed the assessment and treatment plan with the patient. The patient was provided an opportunity to ask questions and all were answered. The patient agreed with the plan and demonstrated an understanding of the instructions.   The patient was advised to call back or seek an in-person evaluation if the symptoms worsen or if the condition fails to improve as anticipated.   I provided 21+ minutes of time during this encounter. Follow up plan: Return in about 6 months (around 08/10/2019), or if symptoms worsen or fail to improve, for BMP,   Lipids, ALT, AST.

## 2019-02-10 NOTE — Assessment & Plan Note (Signed)
Discussed hypertension and recheck blood pressures if remains elevated will contact us and/or increase metoprolol from 50 mg to 100 mg.

## 2019-02-10 NOTE — Assessment & Plan Note (Signed)
A voluntary discussion about advanced care planning including explanation and discussion of advanced directives was extentively discussed with the patient.  Explained about the healthcare proxy and living will was reviewed and packet with forms with expiration of how to fill them out was given.  Time spent: Encounter 16+ min individuals present: Patient 

## 2019-02-17 ENCOUNTER — Ambulatory Visit: Payer: Medicare Other | Admitting: Family Medicine

## 2019-02-17 ENCOUNTER — Other Ambulatory Visit: Payer: Self-pay

## 2019-02-17 ENCOUNTER — Ambulatory Visit (INDEPENDENT_AMBULATORY_CARE_PROVIDER_SITE_OTHER): Payer: Medicare Other

## 2019-02-17 VITALS — BP 122/74 | HR 61 | Temp 98.4°F | Resp 15 | Ht 61.0 in | Wt 213.7 lb

## 2019-02-17 DIAGNOSIS — E785 Hyperlipidemia, unspecified: Secondary | ICD-10-CM | POA: Diagnosis not present

## 2019-02-17 DIAGNOSIS — Z0000 Encounter for general adult medical examination without abnormal findings: Secondary | ICD-10-CM | POA: Diagnosis not present

## 2019-02-17 DIAGNOSIS — Z23 Encounter for immunization: Secondary | ICD-10-CM

## 2019-02-17 DIAGNOSIS — I2510 Atherosclerotic heart disease of native coronary artery without angina pectoris: Secondary | ICD-10-CM | POA: Diagnosis not present

## 2019-02-17 DIAGNOSIS — I10 Essential (primary) hypertension: Secondary | ICD-10-CM | POA: Diagnosis not present

## 2019-02-17 DIAGNOSIS — I2583 Coronary atherosclerosis due to lipid rich plaque: Secondary | ICD-10-CM | POA: Diagnosis not present

## 2019-02-17 LAB — URINALYSIS, ROUTINE W REFLEX MICROSCOPIC
Bilirubin, UA: NEGATIVE
Glucose, UA: NEGATIVE
Ketones, UA: NEGATIVE
Nitrite, UA: NEGATIVE
Protein,UA: NEGATIVE
RBC, UA: NEGATIVE
Specific Gravity, UA: 1.02 (ref 1.005–1.030)
Urobilinogen, Ur: 0.2 mg/dL (ref 0.2–1.0)
pH, UA: 5.5 (ref 5.0–7.5)

## 2019-02-17 LAB — MICROSCOPIC EXAMINATION: RBC: NONE SEEN /hpf (ref 0–2)

## 2019-02-17 NOTE — Patient Instructions (Signed)
Annette Porter , Thank you for taking time to come for your Medicare Wellness Visit. I appreciate your ongoing commitment to your health goals. Please review the following plan we discussed and let me know if I can assist you in the future.   Screening recommendations/referrals: Colonoscopy: completed 05/31/2015 Mammogram: scheduled 04/06/2019 Bone Density: completed 05/07/2017 Recommended yearly ophthalmology/optometry visit for glaucoma screening and checkup Recommended yearly dental visit for hygiene and checkup  Vaccinations: Influenza vaccine: done today Pneumococcal vaccine: up to date Tdap vaccine: up to date Shingles vaccine: shingrix eligible     Advanced directives: Advance directive discussed with you today. I have provided a copy for you to complete at home and have notarized. Once this is complete please bring a copy in to our office so we can scan it into your chart.  Conditions/risks identified: increase walking to 3 times a week for at least 30 minutes.   Next appointment: Follow up in one year for your annual wellness visit.    Preventive Care 46 Years and Older, Female Preventive care refers to lifestyle choices and visits with your health care provider that can promote health and wellness. What does preventive care include?  A yearly physical exam. This is also called an annual well check.  Dental exams once or twice a year.  Routine eye exams. Ask your health care provider how often you should have your eyes checked.  Personal lifestyle choices, including:  Daily care of your teeth and gums.  Regular physical activity.  Eating a healthy diet.  Avoiding tobacco and drug use.  Limiting alcohol use.  Practicing safe sex.  Taking low-dose aspirin every day.  Taking vitamin and mineral supplements as recommended by your health care provider. What happens during an annual well check? The services and screenings done by your health care provider during  your annual well check will depend on your age, overall health, lifestyle risk factors, and family history of disease. Counseling  Your health care provider may ask you questions about your:  Alcohol use.  Tobacco use.  Drug use.  Emotional well-being.  Home and relationship well-being.  Sexual activity.  Eating habits.  History of falls.  Memory and ability to understand (cognition).  Work and work Statistician.  Reproductive health. Screening  You may have the following tests or measurements:  Height, weight, and BMI.  Blood pressure.  Lipid and cholesterol levels. These may be checked every 5 years, or more frequently if you are over 69 years old.  Skin check.  Lung cancer screening. You may have this screening every year starting at age 45 if you have a 30-pack-year history of smoking and currently smoke or have quit within the past 15 years.  Fecal occult blood test (FOBT) of the stool. You may have this test every year starting at age 69.  Flexible sigmoidoscopy or colonoscopy. You may have a sigmoidoscopy every 5 years or a colonoscopy every 10 years starting at age 69.  Hepatitis C blood test.  Hepatitis B blood test.  Sexually transmitted disease (STD) testing.  Diabetes screening. This is done by checking your blood sugar (glucose) after you have not eaten for a while (fasting). You may have this done every 1-3 years.  Bone density scan. This is done to screen for osteoporosis. You may have this done starting at age 69.  Mammogram. This may be done every 1-2 years. Talk to your health care provider about how often you should have regular mammograms. Talk with your health  care provider about your test results, treatment options, and if necessary, the need for more tests. Vaccines  Your health care provider may recommend certain vaccines, such as:  Influenza vaccine. This is recommended every year.  Tetanus, diphtheria, and acellular pertussis (Tdap,  Td) vaccine. You may need a Td booster every 10 years.  Zoster vaccine. You may need this after age 69.  Pneumococcal 13-valent conjugate (PCV13) vaccine. One dose is recommended after age 69.  Pneumococcal polysaccharide (PPSV23) vaccine. One dose is recommended after age 69. Talk to your health care provider about which screenings and vaccines you need and how often you need them. This information is not intended to replace advice given to you by your health care provider. Make sure you discuss any questions you have with your health care provider. Document Released: 06/23/2015 Document Revised: 02/14/2016 Document Reviewed: 03/28/2015 Elsevier Interactive Patient Education  2017 [place] Prevention in the Home Falls can cause injuries. They can happen to people of all ages. There are many things you can do to make your home safe and to help prevent falls. What can I do on the outside of my home?  Regularly fix the edges of walkways and driveways and fix any cracks.  Remove anything that might make you trip as you walk through a door, such as a raised step or threshold.  Trim any bushes or trees on the path to your home.  Use bright outdoor lighting.  Clear any walking paths of anything that might make someone trip, such as rocks or tools.  Regularly check to see if handrails are loose or broken. Make sure that both sides of any steps have handrails.  Any raised decks and porches should have guardrails on the edges.  Have any leaves, snow, or ice cleared regularly.  Use sand or salt on walking paths during winter.  Clean up any spills in your garage right away. This includes oil or grease spills. What can I do in the bathroom?  Use night lights.  Install grab bars by the toilet and in the tub and shower. Do not use towel bars as grab bars.  Use non-skid mats or decals in the tub or shower.  If you need to sit down in the shower, use a plastic, non-slip stool.   Keep the floor dry. Clean up any water that spills on the floor as soon as it happens.  Remove soap buildup in the tub or shower regularly.  Attach bath mats securely with double-sided non-slip rug tape.  Do not have throw rugs and other things on the floor that can make you trip. What can I do in the bedroom?  Use night lights.  Make sure that you have a light by your bed that is easy to reach.  Do not use any sheets or blankets that are too big for your bed. They should not hang down onto the floor.  Have a firm chair that has side arms. You can use this for support while you get dressed.  Do not have throw rugs and other things on the floor that can make you trip. What can I do in the kitchen?  Clean up any spills right away.  Avoid walking on wet floors.  Keep items that you use a lot in easy-to-reach places.  If you need to reach something above you, use a strong step stool that has a grab bar.  Keep electrical cords out of the way.  Do not use floor  polish or wax that makes floors slippery. If you must use wax, use non-skid floor wax.  Do not have throw rugs and other things on the floor that can make you trip. What can I do with my stairs?  Do not leave any items on the stairs.  Make sure that there are handrails on both sides of the stairs and use them. Fix handrails that are broken or loose. Make sure that handrails are as long as the stairways.  Check any carpeting to make sure that it is firmly attached to the stairs. Fix any carpet that is loose or worn.  Avoid having throw rugs at the top or bottom of the stairs. If you do have throw rugs, attach them to the floor with carpet tape.  Make sure that you have a light switch at the top of the stairs and the bottom of the stairs. If you do not have them, ask someone to add them for you. What else can I do to help prevent falls?  Wear shoes that:  Do not have high heels.  Have rubber bottoms.  Are  comfortable and fit you well.  Are closed at the toe. Do not wear sandals.  If you use a stepladder:  Make sure that it is fully opened. Do not climb a closed stepladder.  Make sure that both sides of the stepladder are locked into place.  Ask someone to hold it for you, if possible.  Clearly mark and make sure that you can see:  Any grab bars or handrails.  First and last steps.  Where the edge of each step is.  Use tools that help you move around (mobility aids) if they are needed. These include:  Canes.  Walkers.  Scooters.  Crutches.  Turn on the lights when you go into a dark area. Replace any light bulbs as soon as they burn out.  Set up your furniture so you have a clear path. Avoid moving your furniture around.  If any of your floors are uneven, fix them.  If there are any pets around you, be aware of where they are.  Review your medicines with your doctor. Some medicines can make you feel dizzy. This can increase your chance of falling. Ask your doctor what other things that you can do to help prevent falls. This information is not intended to replace advice given to you by your health care provider. Make sure you discuss any questions you have with your health care provider. Document Released: 03/23/2009 Document Revised: 11/02/2015 Document Reviewed: 07/01/2014 Elsevier Interactive Patient Education  2017 [employer].

## 2019-02-17 NOTE — Progress Notes (Signed)
Subjective:   Annette Porter is a 68 y.o. female who presents for Medicare Annual (Subsequent) preventive examination.  Review of Systems:  Cardiac Risk Factors include: advanced age (>27men, >1 women);dyslipidemia;hypertension;obesity (BMI >30kg/m2)     Objective:     Vitals: BP 122/74 (BP Location: Right Arm, Patient Position: Sitting, Cuff Size: Normal)   Pulse 61   Temp 98.4 F (36.9 C) (Oral)   Resp 15   Ht 5\' 1"  (1.549 m)   BMI 40.25 kg/m   Body mass index is 40.25 kg/m.  Advanced Directives 02/17/2019 11/30/2018 01/26/2018 10/24/2017 09/22/2017 01/22/2017 05/31/2015  Does Patient Have a Medical Advance Directive? No No No No No No No  Would patient like information on creating a medical advance directive? - No - Patient declined Yes (MAU/Ambulatory/Procedural Areas - Information given) - No - Patient declined Yes (MAU/Ambulatory/Procedural Areas - Information given) -    Tobacco Social History   Tobacco Use  Smoking Status Never Smoker  Smokeless Tobacco Never Used     Counseling given: Not Answered   Clinical Intake:  Pre-visit preparation completed: Yes        Nutritional Risks: None Diabetes: No  How often do you need to have someone help you when you read instructions, pamphlets, or other written materials from your doctor or pharmacy?: 1 - Never  Interpreter Needed?: No  Information entered by :: Reice Bienvenue,LPN  Past Medical History:  Diagnosis Date  . 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 CAD (coronary artery disease)   . GERD (gastroesophageal reflux disease)   . Hyperlipidemia   . Hypertension   . Hypothyroidism   . Morbid obesity ([place])   . Myocardial infarction ([place]) 07/1998  . Personal history of radiation therapy 2014 and 2015   mammostite for breast ca   Past Surgical History:  Procedure Laterality Date  . BREAST BIOPSY Left 03/22/13    DCIS  at 3:00  . BREAST BIOPSY Left 11/11/13   INVASIVE MAMMARY CARCINOMA at 8:00  . BREAST LUMPECTOMY Left 04/29/2013   High grade DCIS, 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  Marland Kitchen BREAST LUMPECTOMY Left 12/16/2013   11 mm ER/ PR positive, Her 2 neu negative, T1c, N0. Mammosite radiation.   Marland Kitchen BREAST LUMPECTOMY WITH MAMMOSITE CAVITY EVALUATION DEVICE Left 2014  . BREAST LUMPECTOMY WITH MAMMOSITE CAVITY EVALUATION DEVICE Left 2015  . BREAST MAMMOSITE  05/20/13  . BREAST SURGERY Left 04/29/13   wide excision  . BREAST SURGERY Left 12-16-13   Wide excision with SN biopsy, INVASIVE MAMMARY CARCINOMA  . CARDIAC CATHETERIZATION    . COLONOSCOPY  2006  . COLONOSCOPY WITH PROPOFOL N/A 05/31/2015   Procedure: COLONOSCOPY WITH PROPOFOL;  Surgeon: Robert Bellow, MD;  Location[facility] ENDOSCOPY;  Service: Endoscopy;  Laterality: N/A;  . HAND SURGERY    . KNEE ARTHROSCOPY WITH MEDIAL MENISECTOMY Left 02/24/2015   Procedure: KNEE ARTHROSCOPY WITH PARTIAL MEDIAL MENISECTOMY;  Surgeon: Christophe Louis, MD;  Location: [facility] ORS;  Service: Orthopedics;  Laterality: Left;   Family History  Problem Relation Age of Onset  . Brain cancer Father   . Thyroid disease Sister   . Diabetes Maternal Uncle   . Thyroid disease Paternal Aunt   . Thyroid disease Sister   . Thyroid disease Sister   . Breast cancer Neg Hx    Social History  Socioeconomic History  . Marital status: Married    Spouse name: Not on file  . Number of children: Not on file  . Years of education: Not on file  . Highest education level: Some college, no degree  Occupational History  . Occupation: retired  Scientific laboratory technician  . Financial resource strain: Not hard at all  . Food insecurity    Worry: Never true    Inability: Never true  . Transportation needs    Medical: No    Non-medical: No  Tobacco Use  . Smoking status: Never Smoker  . Smokeless tobacco: Never Used  Substance and Sexual Activity  .  Alcohol use: No  . Drug use: No  . Sexual activity: Not on file  Lifestyle  . Physical activity    Days per week: 0 days    Minutes per session: 0 min  . Stress: Not at all  Relationships  . Social connections    Talks on phone: More than three times a week    Gets together: More than three times a week    Attends religious service: More than 4 times per year    Active member of club or organization: No    Attends meetings of clubs or organizations: Never    Relationship status: Married  Other Topics Concern  . Not on file  Social History Narrative  . Not on file    Outpatient Encounter Medications as of 02/17/2019  Medication Sig  . aspirin 81 MG tablet Take 81 mg by mouth daily.  Marland Kitchen atorvastatin (LIPITOR) 40 MG tablet Take 1 tablet (40 mg total) by mouth daily.  . Calcium Carbonate (CALCIUM 600 PO) Take 1 tablet by mouth 2 (two) times daily.  . Cholecalciferol (VITAMIN D3) 1000 UNITS CAPS Take 1 capsule by mouth daily.  Marland Kitchen docusate sodium (COLACE) 100 MG capsule Take 100 mg by mouth daily.  Marland Kitchen ezetimibe (ZETIA) 10 MG tablet Take 1 tablet (10 mg total) by mouth daily.  . hydrochlorothiazide (HYDRODIURIL) 25 MG tablet Take 1 tablet (25 mg total) by mouth daily.  Marland Kitchen ibuprofen (ADVIL) 800 MG tablet TAKE 1 TABLET BY MOUTH 3  TIMES DAILY AS NEEDED  . isosorbide mononitrate (IMDUR) 30 MG 24 hr tablet Take 30 mg by mouth daily.  . isosorbide mononitrate (IMDUR) 60 MG 24 hr tablet TAKE 1 TABLET BY MOUTH ONCE DAILY  . letrozole (FEMARA) 2.5 MG tablet TAKE ONE TABLET BY MOUTH ONCE DAILY  . levothyroxine (SYNTHROID) 125 MCG tablet Take 1 tablet (125 mcg total) by mouth daily.  . metoprolol succinate (TOPROL-XL) 50 MG 24 hr tablet Take 1 tablet (50 mg total) by mouth daily.  . ranitidine (ZANTAC) 150 MG tablet Take 150 mg by mouth at bedtime.   . vitamin B-12 (CYANOCOBALAMIN) 250 MCG tablet Take 500 mcg by mouth daily.   . [DISCONTINUED] isosorbide mononitrate (IMDUR) 30 MG 24 hr tablet TAKE 1  TABLET BY MOUTH ONCE DAILY  . nitroGLYCERIN (NITROSTAT) 0.4 MG SL tablet Place 0.4 mg under the tongue every 5 (five) minutes as needed for chest pain.   No facility-administered encounter medications on file as of 02/17/2019.     Activities of Daily Living In your present state of health, do you have any difficulty performing the following activities: 02/17/2019  Hearing? N  Comment no hearing aids  Vision? N  Comment eyeglasses, goes to my eye dr.  Difficulty concentrating or making decisions? N  Walking or climbing stairs? N  Dressing or bathing? N  Doing errands, shopping? N  Preparing Food and eating ? N  Using the Toilet? N  In the past six months, have you accidently leaked urine? Y  Comment wears panty liner for preventative, due to coughing or sneezing.  Do you have problems with loss of bowel control? N  Managing your Medications? N  Managing your Finances? N  Housekeeping or managing your Housekeeping? N  Some recent data might be hidden    Patient Care Team: Guadalupe Maple, MD as PCP - General (Family Medicine) Byrnett, Forest Gleason, MD (General Surgery) Guadalupe Maple, MD (Family Medicine) Noreene Filbert, MD as Referring Physician (Radiation Oncology) Lollie Sails, MD as Consulting Physician (Gastroenterology) Teodoro Spray, MD as Consulting Physician (Cardiology)    Assessment:   This is a routine wellness examination for Renalda.  Exercise Activities and Dietary recommendations Current Exercise Habits: The patient does not participate in regular exercise at present, Exercise limited by: None identified  Goals    . DIET - INCREASE WATER INTAKE     Recommend drinking at least 6-8 glasses of water a day       Fall Risk: Fall Risk  02/17/2019 01/26/2018 09/22/2017 07/16/2017 05/21/2017  Falls in the past year? 0 No No No No    FALL RISK PREVENTION PERTAINING TO THE HOME:  Any stairs in or around the home? Yes  If so, are there any without handrails? No    Home free of loose throw rugs in walkways, pet beds, electrical cords, etc? Yes  Adequate lighting in your home to reduce risk of falls? Yes   ASSISTIVE DEVICES UTILIZED TO PREVENT FALLS:  Life alert? No  Use of a cane, walker or w/c? No  Grab bars in the bathroom? No  Shower chair or bench in shower? No  Elevated toilet seat or a handicapped toilet? No   DME ORDERS:  DME order needed?  No   TIMED UP AND GO:  Was the test performed? Yes .  Length of time to ambulate 10 feet: 10 sec.   GAIT:  Appearance of gait: Gait steady and fast without the use of an assistive device.  Education: Fall risk prevention has been discussed.  Intervention(s) required? No   DME/home health order needed?  No    Depression Screen PHQ 2/9 Scores 02/17/2019 01/26/2018 09/22/2017 07/16/2017  PHQ - 2 Score 0 0 0 0     Cognitive Function     6CIT Screen 01/26/2018 01/22/2017  What Year? 0 points 0 points  What month? 0 points 0 points  What time? 0 points 0 points  Count back from 20 0 points 0 points  Months in reverse 0 points 0 points  Repeat phrase 0 points 0 points  Total Score 0 0    Immunization History  Administered Date(s) Administered  . Fluad Quad(high Dose 65+) 02/17/2019  . Influenza, High Dose Seasonal PF 03/19/2016, 03/17/2017, 03/26/2018  . Influenza,inj,Quad PF,6+ Mos 03/22/2015  . Pneumococcal Conjugate-13 12/21/2014  . Pneumococcal-Unspecified 04/12/2004, 06/26/2009  . Td 04/29/2005, 12/21/2014    Qualifies for Shingles Vaccine? Yes  Zostavax completed n/a. Due for Shingrix. Education has been provided regarding the importance of this vaccine. Pt has been advised to call insurance company to determine out of pocket expense. Advised may also receive vaccine at local pharmacy or Health Dept. Verbalized acceptance and understanding.  Tdap: up to date   Flu Vaccine: Due for Flu vaccine. Does the patient want to receive this vaccine today?  Yes .  Pneumococcal Vaccine:  up to date   Screening Tests Health Maintenance  Topic Date Due  . INFLUENZA VACCINE  01/09/2019  . URINE MICROALBUMIN  08/17/2019  . MAMMOGRAM  04/03/2020  . TETANUS/TDAP  12/20/2024  . COLONOSCOPY  05/30/2025  . DEXA SCAN  Completed  . Hepatitis C Screening  Completed  . PNA vac Low Risk Adult  Completed    Cancer Screenings:  Colorectal Screening: Completed 05/31/2015. Repeat every 10 years  Mammogram: Completed 04/03/2018. Repeat every year; scheduled 04/06/2019  Bone Density: Completed 05/07/2017,  Lung Cancer Screening: (Low Dose CT Chest recommended if Age 66-80 years, 30 pack-year currently smoking OR have quit w/in 15years.) does not qualify.    Additional Screening:  Hepatitis C Screening: does qualify; Completed 01/22/2017  Vision Screening: Recommended annual ophthalmology exams for early detection of glaucoma and other disorders of the eye. Is the patient up to date with their annual eye exam?  Yes , goes every other year.  Who is the provider or what is the name of the office in which the pt attends annual eye exams? My eye dr.    [place] Screening: Recommended annual dental exams for proper oral hygiene  Community Resource Referral:  CRR required this visit?  No       Plan:  I have personally reviewed and addressed the Medicare Annual Wellness questionnaire and have noted the following in the patient's chart:  A. Medical and social history B. Use of alcohol, tobacco or illicit drugs  C. Current medications and supplements D. Functional ability and status E.  Nutritional status F.  Physical activity G. Advance directives H. List of other physicians I.  Hospitalizations, surgeries, and ER visits in previous 12 months J.  [place] such as hearing and vision if needed, cognitive and depression L. Referrals and appointments   In addition, I have reviewed and discussed with patient certain preventive protocols, quality metrics, and best  practice recommendations. A written personalized care plan for preventive services as well as general preventive health recommendations were provided to patient.  Signed,    Bevelyn Ngo, LPN  [phone] Nurse Health Advisor   Nurse Notes: none

## 2019-02-18 LAB — COMPREHENSIVE METABOLIC PANEL
ALT: 38 IU/L — ABNORMAL HIGH (ref 0–32)
AST: 31 IU/L (ref 0–40)
Albumin/Globulin Ratio: 1.5 (ref 1.2–2.2)
Albumin: 4.2 g/dL (ref 3.8–4.8)
Alkaline Phosphatase: 85 IU/L (ref 39–117)
BUN/Creatinine Ratio: 13 (ref 12–28)
BUN: 10 mg/dL (ref 8–27)
Bilirubin Total: 0.3 mg/dL (ref 0.0–1.2)
CO2: 24 mmol/L (ref 20–29)
Calcium: 9.7 mg/dL (ref 8.7–10.3)
Chloride: 105 mmol/L (ref 96–106)
Creatinine, Ser: 0.75 mg/dL (ref 0.57–1.00)
GFR calc Af Amer: 94 mL/min/{1.73_m2} (ref >59)
GFR calc non Af Amer: 82 mL/min/{1.73_m2} (ref >59)
Globulin, Total: 2.8 g/dL (ref 1.5–4.5)
Glucose: 95 mg/dL (ref 65–99)
Potassium: 4.8 mmol/L (ref 3.5–5.2)
Sodium: 142 mmol/L (ref 134–144)
Total Protein: 7 g/dL (ref 6.0–8.5)

## 2019-02-18 LAB — CBC WITH DIFFERENTIAL/PLATELET
Basophils Absolute: 0.1 10*3/uL (ref 0.0–0.2)
Basos: 1 %
EOS (ABSOLUTE): 0.2 10*3/uL (ref 0.0–0.4)
Eos: 3 %
Hematocrit: 42 % (ref 34.0–46.6)
Hemoglobin: 13.8 g/dL (ref 11.1–15.9)
Immature Grans (Abs): 0 10*3/uL (ref 0.0–0.1)
Immature Granulocytes: 0 %
Lymphocytes Absolute: 1.4 10*3/uL (ref 0.7–3.1)
Lymphs: 18 %
MCH: 29.3 pg (ref 26.6–33.0)
MCHC: 32.9 g/dL (ref 31.5–35.7)
MCV: 89 fL (ref 79–97)
Monocytes Absolute: 0.7 10*3/uL (ref 0.1–0.9)
Monocytes: 9 %
Neutrophils Absolute: 5.5 10*3/uL (ref 1.4–7.0)
Neutrophils: 69 %
Platelets: 245 10*3/uL (ref 150–450)
RBC: 4.71 x10E6/uL (ref 3.77–5.28)
RDW: 13.1 % (ref 11.7–15.4)
WBC: 7.9 10*3/uL (ref 3.4–10.8)

## 2019-02-18 LAB — LIPID PANEL
Chol/HDL Ratio: 2.9 ratio (ref 0.0–4.4)
Cholesterol, Total: 134 mg/dL (ref 100–199)
HDL: 47 mg/dL (ref >39)
LDL Chol Calc (NIH): 65 mg/dL (ref 0–99)
Triglycerides: 124 mg/dL (ref 0–149)
VLDL Cholesterol Cal: 22 mg/dL (ref 5–40)

## 2019-02-18 LAB — TSH: TSH: 0.418 u[IU]/mL — ABNORMAL LOW (ref 0.450–4.500)

## 2019-02-21 ENCOUNTER — Other Ambulatory Visit: Payer: Self-pay | Admitting: Family Medicine

## 2019-02-22 ENCOUNTER — Encounter: Payer: Self-pay | Admitting: Family Medicine

## 2019-02-22 ENCOUNTER — Other Ambulatory Visit: Payer: Self-pay | Admitting: Family Medicine

## 2019-02-22 DIAGNOSIS — E0590 Thyrotoxicosis, unspecified without thyrotoxic crisis or storm: Secondary | ICD-10-CM

## 2019-02-22 NOTE — Telephone Encounter (Signed)
Requested medication (s) are due for refill today: yes  Requested medication (s) are on the active medication list: yes  Last refill: 01/03/2019  Future visit scheduled: no  Notes to clinic: Patient requesting 1 year supply   Requested Prescriptions  Pending Prescriptions Disp Refills   ibuprofen (ADVIL) 800 MG tablet [Pharmacy Med Name: IBUPROFEN  800MG   TAB] 270 tablet 3    Sig: TAKE 1 TABLET BY MOUTH 3  TIMES DAILY AS NEEDED     Analgesics:  NSAIDS Passed - 02/21/2019  4:57 AM      Passed - Cr in normal range and within 360 days    Creatinine  Date Value Ref Range Status  04/21/2013 0.81 0.60 - 1.30 mg/dL Final   Creatinine, Ser  Date Value Ref Range Status  02/17/2019 0.75 0.57 - 1.00 mg/dL Final         Passed - HGB in normal range and within 360 days    Hemoglobin  Date Value Ref Range Status  02/17/2019 13.8 11.1 - 15.9 g/dL Final         Passed - Patient is not pregnant      Passed - Valid encounter within last 12 months    Recent Outpatient Visits          1 week ago Hyperlipidemia, unspecified hyperlipidemia type   [facility] [place], Jeannette How, MD   6 months ago Essential hypertension   [place] Crissman, Jeannette How, MD   1 year ago Essential hypertension   [place], Jeannette How, MD   1 year ago Essential hypertension   [place], Jeannette How, MD   1 year ago Acute sinusitis, recurrence not specified, unspecified location   [place], Jeannette How, MD      Future Appointments            In 5 months Crissman, Jeannette How, MD [facility]

## 2019-02-24 ENCOUNTER — Other Ambulatory Visit: Payer: Self-pay

## 2019-02-24 ENCOUNTER — Other Ambulatory Visit: Payer: Medicare Other

## 2019-02-24 DIAGNOSIS — E0590 Thyrotoxicosis, unspecified without thyrotoxic crisis or storm: Secondary | ICD-10-CM

## 2019-02-25 LAB — TSH: TSH: 0.351 u[IU]/mL — ABNORMAL LOW (ref 0.450–4.500)

## 2019-02-26 ENCOUNTER — Other Ambulatory Visit: Payer: Self-pay | Admitting: Family Medicine

## 2019-02-26 DIAGNOSIS — E039 Hypothyroidism, unspecified: Secondary | ICD-10-CM

## 2019-02-26 MED ORDER — LEVOTHYROXINE SODIUM 112 MCG PO TABS: 112.0000 ug | ORAL_TABLET | Freq: Every day | ORAL | 0 refills | Status: DC

## 2019-04-06 ENCOUNTER — Ambulatory Visit
Admission: RE | Admit: 2019-04-06 | Discharge: 2019-04-06 | Disposition: A | Discharge status: Home / Self Care | Payer: Medicare Other | Source: Ambulatory Visit | Attending: Surgery | Admitting: Surgery

## 2019-04-06 DIAGNOSIS — C50312 Malignant neoplasm of lower-inner quadrant of left female breast: Secondary | ICD-10-CM | POA: Insufficient documentation

## 2019-04-06 DIAGNOSIS — R928 Other abnormal and inconclusive findings on diagnostic imaging of breast: Secondary | ICD-10-CM | POA: Diagnosis not present

## 2019-04-06 DIAGNOSIS — Z853 Personal history of malignant neoplasm of breast: Secondary | ICD-10-CM | POA: Diagnosis not present

## 2019-04-07 ENCOUNTER — Telehealth: Payer: Self-pay

## 2019-04-07 NOTE — Telephone Encounter (Signed)
Patient notified normal mammogram. Reminded of follow up appointment.

## 2019-04-14 ENCOUNTER — Encounter: Payer: Self-pay | Admitting: Surgery

## 2019-04-14 ENCOUNTER — Other Ambulatory Visit: Payer: Self-pay

## 2019-04-14 ENCOUNTER — Ambulatory Visit (INDEPENDENT_AMBULATORY_CARE_PROVIDER_SITE_OTHER): Payer: Medicare Other | Admitting: Surgery

## 2019-04-14 VITALS — BP 148/99 | HR 64 | Temp 97.9°F | Resp 14 | Ht 62.0 in | Wt 212.4 lb

## 2019-04-14 DIAGNOSIS — I2510 Atherosclerotic heart disease of native coronary artery without angina pectoris: Secondary | ICD-10-CM | POA: Diagnosis not present

## 2019-04-14 DIAGNOSIS — C50312 Malignant neoplasm of lower-inner quadrant of left female breast: Secondary | ICD-10-CM

## 2019-04-14 DIAGNOSIS — I2583 Coronary atherosclerosis due to lipid rich plaque: Secondary | ICD-10-CM | POA: Diagnosis not present

## 2019-04-14 MED ORDER — LETROZOLE 2.5 MG PO TABS: 2.5000 mg | ORAL_TABLET | Freq: Every day | ORAL | 12 refills | Status: DC

## 2019-04-14 NOTE — Progress Notes (Signed)
Letrozole sent to [facility] today.

## 2019-04-14 NOTE — Progress Notes (Signed)
Outpatient Surgical Follow Up  04/14/2019  Annette Porter is an 68 y.o. female.   Chief Complaint  Patient presents with  . Follow-up    1 yr f/u dx mammo    HPI: Annette Porter is a 68 year old female with history of left invasive breast CA in 2015 and DCIS 2014 left breast  requiring lumpectomy and MammoSite radiation for  both.  She occasionally developed some pulling and tugging in the left breast.  No new masses.  No fevers no chills no weight loss.  Had a recent mammogram that I have personally reviewed showing no evidence of any concerning lesions.  She is tolerating letrozole very well.  I had a lengthy discussion regarding hormonal therapy after 5 years of treatment.  She seems to be tolerating the medication very well and wishes to avoid any cancer recurrences therefore I do think that is very reasonable to continue hormonal therapy for another 5 years.  Past Medical History:  Diagnosis Date  . 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 CAD (coronary artery disease)   . GERD (gastroesophageal reflux disease)   . Hyperlipidemia   . Hypertension   . Hypothyroidism   . Morbid obesity ([place])   . Myocardial infarction ([place]) 07/1998  . Personal history of radiation therapy 2014 and 2015   mammostite for breast ca    Past Surgical History:  Procedure Laterality Date  . BREAST BIOPSY Left 03/22/13    DCIS at 3:00  . BREAST BIOPSY Left 11/11/13   INVASIVE MAMMARY CARCINOMA at 8:00  . BREAST LUMPECTOMY Left 04/29/2013   High grade DCIS, 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  Marland Kitchen BREAST LUMPECTOMY Left 12/16/2013   [facility] 11 mm ER/ PR positive, Her 2 neu negative, T1c, N0. Mammosite radiation.   Marland Kitchen BREAST LUMPECTOMY WITH MAMMOSITE CAVITY EVALUATION DEVICE Left 2014  . BREAST LUMPECTOMY WITH MAMMOSITE CAVITY EVALUATION DEVICE Left 2015  . BREAST MAMMOSITE  05/20/13  . BREAST  SURGERY Left 04/29/13   wide excision  . BREAST SURGERY Left 12-16-13   Wide excision with SN biopsy, INVASIVE MAMMARY CARCINOMA  . CARDIAC CATHETERIZATION    . COLONOSCOPY  2006  . COLONOSCOPY WITH PROPOFOL N/A 05/31/2015   Procedure: COLONOSCOPY WITH PROPOFOL;  Surgeon: Robert Bellow, MD;  Location: [facility] ENDOSCOPY;  Service: Endoscopy;  Laterality: N/A;  . HAND SURGERY    . KNEE ARTHROSCOPY WITH MEDIAL MENISECTOMY Left 02/24/2015   Procedure: KNEE ARTHROSCOPY WITH PARTIAL MEDIAL MENISECTOMY;  Surgeon: Christophe Louis, MD;  Location: [facility] ORS;  Service: Orthopedics;  Laterality: Left;    Family History  Problem Relation Age of Onset  . Brain cancer Father   . Thyroid disease Sister   . Diabetes Maternal Uncle   . Thyroid disease Paternal Aunt   . Thyroid disease Sister   . Thyroid disease Sister   . Breast cancer Neg Hx     Social History:  reports that she has never smoked. She has never used smokeless tobacco. She reports that she does not drink alcohol or use drugs.  Allergies:  Allergies  Allergen Reactions  . Benazepril Cough  . Dilaudid [Hydromorphone] Nausea And Vomiting  . Hydrocodone Itching  . Tape Other (See Comments)    irratation    Medications reviewed.    ROS Full ROS performed and is otherwise negative other than what is stated in  HPI   BP (!) 148/99   Pulse 64   Temp 97.9 F (36.6 C) (Temporal)   Resp 14   Ht 5\' 2"  (1.575 m)   Wt 212 lb 6.4 oz (96.3 kg)   SpO2 97%   BMI 38.85 kg/m   Physical Exam Vitals signs and nursing note reviewed. Exam conducted with a chaperone present.  Constitutional:      General: She is not in acute distress.    Appearance: Normal appearance.  Eyes:     General: No scleral icterus.       Right eye: No discharge.        Left eye: No discharge.     Extraocular Movements: Extraocular movements intact.  Neck:     Musculoskeletal: Normal range of motion and neck supple. No neck rigidity or muscular  tenderness.  Cardiovascular:     Rate and Rhythm: Normal rate and regular rhythm.     Pulses: Normal pulses.     Heart sounds: No murmur.  Pulmonary:     Effort: Pulmonary effort is normal. No respiratory distress.     Breath sounds: No stridor. No wheezing or rhonchi.     Comments: BREAST: Left upper quadrant firmness related to radiation changes and lumpectomy.  There is no new concerning lesions on physical exam on either breast.  There is no lymphadenopathy.  Previous lumpectomy scar on the left Abdominal:     General: Abdomen is flat. There is no distension.     Palpations: There is no mass.     Tenderness: There is no abdominal tenderness. There is no right CVA tenderness or guarding.     Hernia: No hernia is present.  Neurological:     Mental Status: She is alert.  Psychiatric:        Mood and Affect: Mood normal.        Behavior: Behavior normal.        Thought Content: Thought content normal.        Judgment: Judgment normal.     Assessment/Plan: 68 year old female with a previous left breast cancer no new concerning lesions.  Had an extensive discussion about hormonal therapy and she wishes to extend the treatment for 5 more years.  Currently there is no contraindication to continue hormonal therapy and she is very motivated to do so.  We will see her back in 1 year with a mammogram    Greater than 50% of the 25 minutes  visit was spent in counseling/coordination of care   Caroleen Hamman, MD [place] Surgeon

## 2019-04-14 NOTE — Patient Instructions (Signed)
Please call our office if you have not heard from our office by the end of October 2021 to schedule the mammogram and follow up appointment with Dr.Pabon.

## 2019-04-16 ENCOUNTER — Other Ambulatory Visit: Payer: Self-pay

## 2019-04-16 DIAGNOSIS — E039 Hypothyroidism, unspecified: Secondary | ICD-10-CM

## 2019-04-16 NOTE — Telephone Encounter (Signed)
Lab appointment scheduled for 11/10. Routing back to provider for refill after lab.

## 2019-04-16 NOTE — Telephone Encounter (Signed)
Last seen 02/10/19 and has appointment 08/10/19.

## 2019-04-16 NOTE — Telephone Encounter (Signed)
Called and left patient a VM asking for her to please return my call.  

## 2019-04-16 NOTE — Telephone Encounter (Signed)
Needs to come in for labs- order already in

## 2019-04-19 ENCOUNTER — Other Ambulatory Visit: Payer: Self-pay

## 2019-04-20 ENCOUNTER — Other Ambulatory Visit: Payer: Medicare Other

## 2019-04-20 ENCOUNTER — Other Ambulatory Visit: Payer: Self-pay

## 2019-04-20 DIAGNOSIS — E039 Hypothyroidism, unspecified: Secondary | ICD-10-CM | POA: Diagnosis not present

## 2019-04-21 LAB — TSH: TSH: 0.542 u[IU]/mL (ref 0.450–4.500)

## 2019-04-21 MED ORDER — LEVOTHYROXINE SODIUM 112 MCG PO TABS: 112.0000 ug | ORAL_TABLET | Freq: Every day | ORAL | 0 refills | Status: DC

## 2019-06-01 ENCOUNTER — Encounter: Payer: Self-pay | Admitting: Family Medicine

## 2019-06-01 ENCOUNTER — Ambulatory Visit (INDEPENDENT_AMBULATORY_CARE_PROVIDER_SITE_OTHER): Payer: Medicare Other | Admitting: Family Medicine

## 2019-06-01 ENCOUNTER — Other Ambulatory Visit: Payer: Self-pay

## 2019-06-01 VITALS — BP 182/81 | HR 61 | Ht 62.0 in | Wt 212.0 lb

## 2019-06-01 DIAGNOSIS — J0100 Acute maxillary sinusitis, unspecified: Secondary | ICD-10-CM

## 2019-06-01 DIAGNOSIS — I2583 Coronary atherosclerosis due to lipid rich plaque: Secondary | ICD-10-CM | POA: Diagnosis not present

## 2019-06-01 DIAGNOSIS — I2510 Atherosclerotic heart disease of native coronary artery without angina pectoris: Secondary | ICD-10-CM | POA: Diagnosis not present

## 2019-06-01 MED ORDER — AMOXICILLIN-POT CLAVULANATE 875-125 MG PO TABS: 1.0000 | ORAL_TABLET | Freq: Two times a day (BID) | ORAL | 0 refills | Status: DC

## 2019-06-01 NOTE — Progress Notes (Signed)
   BP (!) 182/81   Pulse 61   Ht 5\' 2"  (1.575 m)   Wt 212 lb (96.2 kg)   BMI 38.78 kg/m    Subjective:    Patient ID: Annette Porter, female    DOB: 03/31/1951, 68 y.o.   MRN: [id number]  HPI: Annette Porter is a 68 y.o. female  Chief Complaint  Patient presents with  . Sinusitis    x over a week. has tried otc cold medication  . Cough    produstive green phlegm    . This visit was completed via telephone due to the restrictions of the COVID-19 pandemic. All issues as above were discussed and addressed. Physical exam was done as above through visual confirmation on telephone. If it was felt that the patient should be evaluated in the office, they were directed there. The patient verbally consented to this visit. . Location of the patient: home . Location of the provider: home . Those involved with this call:  . Provider: Merrie Roof, PA-C . CMA: Lesle Chris, [place] . Front Desk/Registration: Jill Side  . Time spent on call: 15 minutes on the phone discussing health concerns. 5 minutes total spent in review of patient's record and preparation of their chart. I verified patient identity using two factors (patient name and date of birth). Patient consents verbally to being seen via telemedicine visit today.   Over a week of sinus congestion, pain and pressure, post nasal drainage, productive cough. Taking OTC cold and sinus medications without much relief. Denies fever, chills, sweats, CP, SOB, N/V/D, sick contacts, recent travel. Notes she gets a sinus infection this time every single year.   Relevant past medical, surgical, family and social history reviewed and updated as indicated. Interim medical history since our last visit reviewed. Allergies and medications reviewed and updated.  Review of Systems  Per HPI unless specifically indicated above     Objective:    BP (!) 182/81   Pulse 61   Ht 5\' 2"  (1.575 m)   Wt 212 lb (96.2 kg)   BMI 38.78 kg/m   Wt Readings  from Last 3 Encounters:  06/01/19 212 lb (96.2 kg)  04/14/19 212 lb 6.4 oz (96.3 kg)  02/17/19 213 lb 11.2 oz (96.9 kg)    Physical Exam  Unable to perform PE due to patient lack of access to video technology for today's visit.   Results for orders placed or performed in visit on 04/20/19  TSH  Result Value Ref Range   TSH 0.542 0.450 - 4.500 uIU/mL      Assessment & Plan:   Problem List Items Addressed This Visit    None    Visit Diagnoses    Acute maxillary sinusitis, recurrence not specified    -  Primary   Declines COVID testing, will quarantine until sxs improve. Tx with augmentin, mucinex, and start good allergy regimen given seasonal infections   Relevant Medications   amoxicillin-clavulanate (AUGMENTIN) 875-125 MG tablet       Follow up plan: Return if symptoms worsen or fail to improve.

## 2019-07-06 ENCOUNTER — Other Ambulatory Visit: Payer: Self-pay | Admitting: Nurse Practitioner

## 2019-07-06 DIAGNOSIS — E039 Hypothyroidism, unspecified: Secondary | ICD-10-CM

## 2019-07-13 DIAGNOSIS — I10 Essential (primary) hypertension: Secondary | ICD-10-CM | POA: Diagnosis not present

## 2019-07-13 DIAGNOSIS — I480 Paroxysmal atrial fibrillation: Secondary | ICD-10-CM | POA: Diagnosis not present

## 2019-07-13 DIAGNOSIS — I25118 Atherosclerotic heart disease of native coronary artery with other forms of angina pectoris: Secondary | ICD-10-CM | POA: Diagnosis not present

## 2019-08-10 ENCOUNTER — Ambulatory Visit: Payer: Self-pay | Admitting: Family Medicine

## 2019-08-15 ENCOUNTER — Encounter: Payer: Self-pay | Admitting: Nurse Practitioner

## 2019-08-16 ENCOUNTER — Encounter: Payer: Self-pay | Admitting: Nurse Practitioner

## 2019-08-16 ENCOUNTER — Ambulatory Visit (INDEPENDENT_AMBULATORY_CARE_PROVIDER_SITE_OTHER): Payer: Medicare Other | Admitting: Nurse Practitioner

## 2019-08-16 ENCOUNTER — Other Ambulatory Visit: Payer: Self-pay

## 2019-08-16 ENCOUNTER — Ambulatory Visit: Payer: Medicare Other | Attending: Internal Medicine

## 2019-08-16 DIAGNOSIS — I2510 Atherosclerotic heart disease of native coronary artery without angina pectoris: Secondary | ICD-10-CM

## 2019-08-16 DIAGNOSIS — I2583 Coronary atherosclerosis due to lipid rich plaque: Secondary | ICD-10-CM | POA: Diagnosis not present

## 2019-08-16 DIAGNOSIS — E782 Mixed hyperlipidemia: Secondary | ICD-10-CM | POA: Diagnosis not present

## 2019-08-16 DIAGNOSIS — Z853 Personal history of malignant neoplasm of breast: Secondary | ICD-10-CM | POA: Diagnosis not present

## 2019-08-16 DIAGNOSIS — I10 Essential (primary) hypertension: Secondary | ICD-10-CM

## 2019-08-16 DIAGNOSIS — I252 Old myocardial infarction: Secondary | ICD-10-CM | POA: Diagnosis not present

## 2019-08-16 DIAGNOSIS — E039 Hypothyroidism, unspecified: Secondary | ICD-10-CM

## 2019-08-16 DIAGNOSIS — Z23 Encounter for immunization: Secondary | ICD-10-CM | POA: Insufficient documentation

## 2019-08-16 NOTE — Patient Instructions (Signed)
DASH Eating Plan DASH stands for "Dietary Approaches to Stop Hypertension." The DASH eating plan is a healthy eating plan that has been shown to reduce high blood pressure (hypertension). It may also reduce your risk for type 2 diabetes, heart disease, and stroke. The DASH eating plan may also help with weight loss. What are tips for following this plan?  General guidelines  Avoid eating more than 2,300 mg (milligrams) of salt (sodium) a day. If you have hypertension, you may need to reduce your sodium intake to 1,500 mg a day.  Limit alcohol intake to no more than 1 drink a day for nonpregnant women and 2 drinks a day for men. One drink equals 12 oz of beer, 5 oz of wine, or 1 oz of hard liquor.  Work with your health care provider to maintain a healthy body weight or to lose weight. Ask what an ideal weight is for you.  Get at least 30 minutes of exercise that causes your heart to beat faster (aerobic exercise) most days of the week. Activities may include walking, swimming, or biking.  Work with your health care provider or diet and nutrition specialist (dietitian) to adjust your eating plan to your individual calorie needs. Reading food labels   Check food labels for the amount of sodium per serving. Choose foods with less than 5 percent of the Daily Value of sodium. Generally, foods with less than 300 mg of sodium per serving fit into this eating plan.  To find whole grains, look for the word "whole" as the first word in the ingredient list. Shopping  Buy products labeled as "low-sodium" or "no salt added."  Buy fresh foods. Avoid canned foods and premade or frozen meals. Cooking  Avoid adding salt when cooking. Use salt-free seasonings or herbs instead of table salt or sea salt. Check with your health care provider or pharmacist before using salt substitutes.  Do not fry foods. Cook foods using healthy methods such as baking, boiling, grilling, and broiling instead.  Cook with  heart-healthy oils, such as olive, canola, soybean, or sunflower oil. Meal planning  Eat a balanced diet that includes: ? 5 or more servings of fruits and vegetables each day. At each meal, try to fill half of your plate with fruits and vegetables. ? Up to 6-8 servings of whole grains each day. ? Less than 6 oz of lean meat, poultry, or fish each day. A 3-oz serving of meat is about the same size as a deck of cards. One egg equals 1 oz. ? 2 servings of low-fat dairy each day. ? A serving of nuts, seeds, or beans 5 times each week. ? Heart-healthy fats. Healthy fats called Omega-3 fatty acids are found in foods such as flaxseeds and coldwater fish, like sardines, salmon, and mackerel.  Limit how much you eat of the following: ? Canned or prepackaged foods. ? Food that is high in trans fat, such as fried foods. ? Food that is high in saturated fat, such as fatty meat. ? Sweets, desserts, sugary drinks, and other foods with added sugar. ? Full-fat dairy products.  Do not salt foods before eating.  Try to eat at least 2 vegetarian meals each week.  Eat more home-cooked food and less restaurant, buffet, and fast food.  When eating at a restaurant, ask that your food be prepared with less salt or no salt, if possible. What foods are recommended? The items listed may not be a complete list. Talk with your dietitian about   what dietary choices are best for you. Grains Whole-grain or whole-wheat bread. Whole-grain or whole-wheat pasta. Brown rice. Oatmeal. Quinoa. Bulgur. Whole-grain and low-sodium cereals. Pita bread. Low-fat, low-sodium crackers. Whole-wheat flour tortillas. Vegetables Fresh or frozen vegetables (raw, steamed, roasted, or grilled). Low-sodium or reduced-sodium tomato and vegetable juice. Low-sodium or reduced-sodium tomato sauce and tomato paste. Low-sodium or reduced-sodium canned vegetables. Fruits All fresh, dried, or frozen fruit. Canned fruit in natural juice (without  added sugar). Meat and other protein foods Skinless chicken or turkey. Ground chicken or turkey. Pork with fat trimmed off. Fish and seafood. Egg whites. Dried beans, peas, or lentils. Unsalted nuts, nut butters, and seeds. Unsalted canned beans. Lean cuts of beef with fat trimmed off. Low-sodium, lean deli meat. Dairy Low-fat (1%) or fat-free (skim) milk. Fat-free, low-fat, or reduced-fat cheeses. Nonfat, low-sodium ricotta or cottage cheese. Low-fat or nonfat yogurt. Low-fat, low-sodium cheese. Fats and oils Soft margarine without trans fats. Vegetable oil. Low-fat, reduced-fat, or light mayonnaise and salad dressings (reduced-sodium). Canola, safflower, olive, soybean, and sunflower oils. Avocado. Seasoning and other foods Herbs. Spices. Seasoning mixes without salt. Unsalted popcorn and pretzels. Fat-free sweets. What foods are not recommended? The items listed may not be a complete list. Talk with your dietitian about what dietary choices are best for you. Grains Baked goods made with fat, such as croissants, muffins, or some breads. Dry pasta or rice meal packs. Vegetables Creamed or fried vegetables. Vegetables in a cheese sauce. Regular canned vegetables (not low-sodium or reduced-sodium). Regular canned tomato sauce and paste (not low-sodium or reduced-sodium). Regular tomato and vegetable juice (not low-sodium or reduced-sodium). Pickles. Olives. Fruits Canned fruit in a light or heavy syrup. Fried fruit. Fruit in cream or butter sauce. Meat and other protein foods Fatty cuts of meat. Ribs. Fried meat. Bacon. Sausage. Bologna and other processed lunch meats. Salami. Fatback. Hotdogs. Bratwurst. Salted nuts and seeds. Canned beans with added salt. Canned or smoked fish. Whole eggs or egg yolks. Chicken or turkey with skin. Dairy Whole or 2% milk, cream, and half-and-half. Whole or full-fat cream cheese. Whole-fat or sweetened yogurt. Full-fat cheese. Nondairy creamers. Whipped toppings.  Processed cheese and cheese spreads. Fats and oils Butter. Stick margarine. Lard. Shortening. Ghee. Bacon fat. Tropical oils, such as coconut, palm kernel, or palm oil. Seasoning and other foods Salted popcorn and pretzels. Onion salt, garlic salt, seasoned salt, table salt, and sea salt. Worcestershire sauce. Tartar sauce. Barbecue sauce. Teriyaki sauce. Soy sauce, including reduced-sodium. Steak sauce. Canned and packaged gravies. Fish sauce. Oyster sauce. Cocktail sauce. Horseradish that you find on the shelf. Ketchup. Mustard. Meat flavorings and tenderizers. Bouillon cubes. Hot sauce and Tabasco sauce. Premade or packaged marinades. Premade or packaged taco seasonings. Relishes. Regular salad dressings. Where to find more information:  National Heart, Lung, and Blood Institute: www.nhlbi.nih.gov  American Heart Association: www.heart.org Summary  The DASH eating plan is a healthy eating plan that has been shown to reduce high blood pressure (hypertension). It may also reduce your risk for type 2 diabetes, heart disease, and stroke.  With the DASH eating plan, you should limit salt (sodium) intake to 2,300 mg a day. If you have hypertension, you may need to reduce your sodium intake to 1,500 mg a day.  When on the DASH eating plan, aim to eat more fresh fruits and vegetables, whole grains, lean proteins, low-fat dairy, and heart-healthy fats.  Work with your health care provider or diet and nutrition specialist (dietitian) to adjust your eating plan to your   individual calorie needs. This information is not intended to replace advice given to you by your health care provider. Make sure you discuss any questions you have with your health care provider. Document Revised: 05/09/2017 Document Reviewed: 05/20/2016 Elsevier Patient Education  2020 Elsevier Inc.  

## 2019-08-16 NOTE — Assessment & Plan Note (Signed)
Recommend continued focus on healthy diet choices and regular physical activity (30 minutes 5 days a week).  Recommend small goals at a time.

## 2019-08-16 NOTE — Assessment & Plan Note (Addendum)
Chronic, ongoing.  Continue current medication regimen and adjust as needed based on thyroid labs.  Obtain labs today. 

## 2019-08-16 NOTE — Assessment & Plan Note (Signed)
Chronic, ongoing.  Continue current medication regimen and adjust as needed. Lipid panel today. 

## 2019-08-16 NOTE — Assessment & Plan Note (Signed)
Chronic, continue ASA and statin.  Continue collaboration with cardiology. 

## 2019-08-16 NOTE — Progress Notes (Signed)
BP 132/78 (BP Location: Right Arm, Patient Position: Sitting)   Pulse 60   Temp 98.5 F (36.9 C) (Oral)   Ht 5\' 1"  (1.549 m)   Wt 216 lb (98 kg)   SpO2 97%   BMI 40.81 kg/m    Subjective:    Patient ID: Annette Porter, female    DOB: 04-18-1951, 69 y.o.   MRN: [id number]  HPI: Annette Porter is a 69 y.o. female  Chief Complaint  Patient presents with  . Hyperlipidemia  . Hypertension   HYPERTENSION / HYPERLIPIDEMIA Continues on Isosorbide 24HR -- 60 MG + 30 MG, HCTZ, Metoprolol, ASA, Atorvastatin, and Zetia. Has NTG -- has not used recently, has been several months.  Followed by cardiology, Dr. Ubaldo Glassing, and last saw 07/13/2019.  Has history of MI in 2005 and CAD. Satisfied with current treatment? yes Duration of hypertension: chronic BP monitoring frequency: not checking BP range:  BP medication side effects: no Duration of hyperlipidemia: chronic Cholesterol medication side effects: no Cholesterol supplements: none Medication compliance: good compliance Aspirin: yes Recent stressors: no Recurrent headaches: no Visual changes: no Palpitations: no Dyspnea: no Chest pain: no Lower extremity edema: no Dizzy/lightheaded: no   HYPOTHYROIDISM Recent TSH 0.542.  Continues on Levothyroxine 112 MCG.   Thyroid control status:stable Satisfied with current treatment? yes Medication side effects: no Medication compliance: good compliance Etiology of hypothyroidism:  Recent dose adjustment:no Fatigue: no Cold intolerance: no Heat intolerance: no Weight gain: no Weight loss: no Constipation: no Diarrhea/loose stools: no Palpitations: no Lower extremity edema: no Anxiety/depressed mood: no  HISTORY OF BREAST CA: Had in 2014 and 2015, radiation both times.  Follow-up visits with Dr. Dahlia Byes, last 04/14/2019.  Relevant past medical, surgical, family and social history reviewed and updated as indicated. Interim medical history since our last visit reviewed. Allergies and  medications reviewed and updated.  Review of Systems  Constitutional: Negative for activity change, appetite change, diaphoresis, fatigue and fever.  Respiratory: Negative for cough, chest tightness, shortness of breath and wheezing.   Cardiovascular: Negative for chest pain, palpitations and leg swelling.  Gastrointestinal: Negative.   Neurological: Negative.   Psychiatric/Behavioral: Negative.     Per HPI unless specifically indicated above     Objective:    BP 132/78 (BP Location: Right Arm, Patient Position: Sitting)   Pulse 60   Temp 98.5 F (36.9 C) (Oral)   Ht 5\' 1"  (1.549 m)   Wt 216 lb (98 kg)   SpO2 97%   BMI 40.81 kg/m   Wt Readings from Last 3 Encounters:  08/16/19 216 lb (98 kg)  06/01/19 212 lb (96.2 kg)  04/14/19 212 lb 6.4 oz (96.3 kg)    Physical Exam Vitals and nursing note reviewed.  Constitutional:      General: She is awake. She is not in acute distress.    Appearance: She is well-developed. She is morbidly obese. She is not ill-appearing.  HENT:     Head: Normocephalic.     Right Ear: Hearing normal.     Left Ear: Hearing normal.  Eyes:     General: Lids are normal.        Right eye: No discharge.        Left eye: No discharge.     Conjunctiva/sclera: Conjunctivae normal.     Pupils: Pupils are equal, round, and reactive to light.  Neck:     Thyroid: No thyromegaly.     Vascular: No carotid bruit.  Cardiovascular:  Rate and Rhythm: Normal rate and regular rhythm.     Heart sounds: Normal heart sounds. No murmur. No gallop.   Pulmonary:     Effort: Pulmonary effort is normal. No accessory muscle usage or respiratory distress.     Breath sounds: Normal breath sounds.  Abdominal:     General: Bowel sounds are normal.     Palpations: Abdomen is soft.  Musculoskeletal:     Cervical back: Normal range of motion and neck supple.     Right lower leg: No edema.     Left lower leg: No edema.  Skin:    General: Skin is warm and dry.    Neurological:     Mental Status: She is alert and oriented to person, place, and time.  Psychiatric:        Attention and Perception: Attention normal.        Mood and Affect: Mood normal.        Speech: Speech normal.        Behavior: Behavior normal. Behavior is cooperative.     Results for orders placed or performed in visit on 04/20/19  TSH  Result Value Ref Range   TSH 0.542 0.450 - 4.500 uIU/mL      Assessment & Plan:   Problem List Items Addressed This Visit      Cardiovascular and Mediastinum   Hypertension    Chronic, ongoing.  Initial BP levels elevated, but recheck at goal.  Recommend she monitor BP at least a few mornings a week at home and document.  DASH diet at home.  Continue current medication regimen and adjust as needed.  Labs today.  Return in 6 months.      Relevant Orders   Comprehensive metabolic panel   CAD (coronary atherosclerotic disease)    Chronic, continue ASA and statin.  Continue collaboration with cardiology.        Endocrine   Hypothyroidism    Chronic, ongoing.  Continue current medication regimen and adjust as needed based on thyroid labs.  Obtain labs today.      Relevant Orders   Thyroid Panel With TSH     Other   History of breast cancer    Continue collaboration with Dr. Dahlia Byes, with surgical team, and oncology team.      Hyperlipidemia    Chronic, ongoing.  Continue current medication regimen and adjust as needed.  Lipid panel today.      Relevant Orders   Comprehensive metabolic panel   Lipid Panel w/o Chol/HDL Ratio   History of MI (myocardial infarction)    Continue current medication regimen and collaboration with cardiology.  Monitor NTG use.      Morbid obesity ([place]) - Primary    Recommend continued focus on healthy diet choices and regular physical activity (30 minutes 5 days a week).  Recommend small goals at a time.          Follow up plan: Return in about 6 months (around 02/16/2020) for HTN/HLD,  hypothyroid, GERD.

## 2019-08-16 NOTE — Assessment & Plan Note (Signed)
Continue current medication regimen and collaboration with cardiology.  Monitor NTG use.

## 2019-08-16 NOTE — Progress Notes (Signed)
   Covid-19 Vaccination Clinic  Name:  [name]    MRN: [id number] DOB: [birth year]  08/16/2019  [name] was observed post Covid-19 immunization for 15 minutes without incident. She was provided with Vaccine Information Sheet and instruction to access the V-Safe system.   Ms. Sharp was instructed to call 911 with any severe reactions post vaccine: [place] Kitchen Difficulty breathing  . Swelling of face and throat  . A fast heartbeat  . A bad rash all over body  . Dizziness and weakness   Immunizations Administered    Name Date Dose VIS Date Route   Pfizer COVID-19 Vaccine 08/16/2019 11:04 AM 0.3 mL 05/21/2019 Intramuscular   Manufacturer: [place]   Lot: [id number]   [place]: [id number]

## 2019-08-16 NOTE — Assessment & Plan Note (Signed)
Chronic, ongoing.  Initial BP levels elevated, but recheck at goal.  Recommend she monitor BP at least a few mornings a week at home and document.  DASH diet at home.  Continue current medication regimen and adjust as needed.  Labs today.  Return in 6 months.

## 2019-08-16 NOTE — Assessment & Plan Note (Signed)
Continue collaboration with Dr. Dahlia Byes, with surgical team, and oncology team.

## 2019-08-17 LAB — LIPID PANEL W/O CHOL/HDL RATIO
Cholesterol, Total: 142 mg/dL (ref 100–199)
HDL: 50 mg/dL (ref >39)
LDL Chol Calc (NIH): 71 mg/dL (ref 0–99)
Triglycerides: 114 mg/dL (ref 0–149)
VLDL Cholesterol Cal: 21 mg/dL (ref 5–40)

## 2019-08-17 LAB — THYROID PANEL WITH TSH
Free Thyroxine Index: 2.2 (ref 1.2–4.9)
T3 Uptake Ratio: 27 % (ref 24–39)
T4, Total: 8.1 ug/dL (ref 4.5–12.0)
TSH: 0.518 u[IU]/mL (ref 0.450–4.500)

## 2019-08-17 LAB — COMPREHENSIVE METABOLIC PANEL
ALT: 33 IU/L — ABNORMAL HIGH (ref 0–32)
AST: 26 IU/L (ref 0–40)
Albumin/Globulin Ratio: 1.6 (ref 1.2–2.2)
Albumin: 4.1 g/dL (ref 3.8–4.8)
Alkaline Phosphatase: 85 IU/L (ref 39–117)
BUN/Creatinine Ratio: 24 (ref 12–28)
BUN: 17 mg/dL (ref 8–27)
Bilirubin Total: 0.3 mg/dL (ref 0.0–1.2)
CO2: 23 mmol/L (ref 20–29)
Calcium: 9.1 mg/dL (ref 8.7–10.3)
Chloride: 100 mmol/L (ref 96–106)
Creatinine, Ser: 0.7 mg/dL (ref 0.57–1.00)
GFR calc Af Amer: 102 mL/min/{1.73_m2} (ref >59)
GFR calc non Af Amer: 89 mL/min/{1.73_m2} (ref >59)
Globulin, Total: 2.5 g/dL (ref 1.5–4.5)
Glucose: 87 mg/dL (ref 65–99)
Potassium: 4.6 mmol/L (ref 3.5–5.2)
Sodium: 137 mmol/L (ref 134–144)
Total Protein: 6.6 g/dL (ref 6.0–8.5)

## 2019-08-17 NOTE — Progress Notes (Signed)
Contacted via MyChart

## 2019-09-06 ENCOUNTER — Other Ambulatory Visit: Payer: Self-pay | Admitting: Nurse Practitioner

## 2019-09-06 DIAGNOSIS — E039 Hypothyroidism, unspecified: Secondary | ICD-10-CM

## 2019-09-07 ENCOUNTER — Ambulatory Visit: Payer: Medicare Other | Attending: Internal Medicine

## 2019-09-07 DIAGNOSIS — Z23 Encounter for immunization: Secondary | ICD-10-CM

## 2019-09-07 NOTE — Progress Notes (Signed)
   Covid-19 Vaccination Clinic  Name:  Annette Porter    MRN: [id number] DOB: 1950/11/25  09/07/2019  [name] was observed post Covid-19 immunization for 15 minutes without incident. She was provided with Vaccine Information Sheet and instruction to access the V-Safe system.   [name] was instructed to call 911 with any severe reactions post vaccine: Mar[place] Kitchen Difficulty breathing  . Swelling of face and throat  . A fast heartbeat  . A bad rash all over body  . Dizziness and weakness   Immunizations Administered    Name Date Dose VIS Date Route   Pfizer COVID-19 Vaccine 09/07/2019 12:04 PM 0.3 mL 05/21/2019 Intramuscular   Manufacturer: [place]   Lot: [id number]   [place]: [id number]

## 2020-01-18 DIAGNOSIS — I25118 Atherosclerotic heart disease of native coronary artery with other forms of angina pectoris: Secondary | ICD-10-CM | POA: Diagnosis not present

## 2020-01-18 DIAGNOSIS — E782 Mixed hyperlipidemia: Secondary | ICD-10-CM | POA: Diagnosis not present

## 2020-01-18 DIAGNOSIS — I10 Essential (primary) hypertension: Secondary | ICD-10-CM | POA: Diagnosis not present

## 2020-01-18 DIAGNOSIS — E119 Type 2 diabetes mellitus without complications: Secondary | ICD-10-CM | POA: Diagnosis not present

## 2020-02-15 ENCOUNTER — Other Ambulatory Visit: Payer: Self-pay | Admitting: General Surgery

## 2020-02-15 DIAGNOSIS — Z1231 Encounter for screening mammogram for malignant neoplasm of breast: Secondary | ICD-10-CM

## 2020-02-16 ENCOUNTER — Other Ambulatory Visit: Payer: Self-pay

## 2020-02-16 DIAGNOSIS — C50312 Malignant neoplasm of lower-inner quadrant of left female breast: Secondary | ICD-10-CM

## 2020-02-27 ENCOUNTER — Other Ambulatory Visit: Payer: Self-pay | Admitting: Nurse Practitioner

## 2020-02-27 DIAGNOSIS — E039 Hypothyroidism, unspecified: Secondary | ICD-10-CM

## 2020-02-27 NOTE — Telephone Encounter (Signed)
Requested Prescriptions  Pending Prescriptions Disp Refills  . levothyroxine (SYNTHROID) 112 MCG tablet [Pharmacy Med Name: LEVOTHYROXINE  112MCG  TAB] 90 tablet 3    Sig: TAKE 1 TABLET BY MOUTH  DAILY     Endocrinology:  Hypothyroid Agents Failed - 02/27/2020  5:12 AM      Failed - TSH needs to be rechecked within 3 months after an abnormal result. Refill until TSH is due.      Passed - TSH in normal range and within 360 days    TSH  Date Value Ref Range Status  08/16/2019 0.518 0.450 - 4.500 uIU/mL Final         Passed - Valid encounter within last 12 months    Recent Outpatient Visits          6 months ago Morbid obesity ([place])   [place], Tiffin T, NP   9 months ago Acute maxillary sinusitis, recurrence not specified   [facility] [place], [place]   1 year ago Hyperlipidemia, unspecified hyperlipidemia type   [facility] [place], Jeannette How, MD   1 year ago Essential hypertension   [place], Jeannette How, MD   2 years ago Essential hypertension   [place], Jeannette How, MD      Future Appointments            In 4 weeks Cann[place], Barbaraann Faster, NP [employer], [facility]

## 2020-02-28 ENCOUNTER — Other Ambulatory Visit: Payer: Self-pay

## 2020-02-28 DIAGNOSIS — I2583 Coronary atherosclerosis due to lipid rich plaque: Secondary | ICD-10-CM

## 2020-02-28 DIAGNOSIS — E785 Hyperlipidemia, unspecified: Secondary | ICD-10-CM

## 2020-02-28 DIAGNOSIS — I10 Essential (primary) hypertension: Secondary | ICD-10-CM

## 2020-02-28 MED ORDER — EZETIMIBE 10 MG PO TABS: 10.0000 mg | ORAL_TABLET | Freq: Every day | ORAL | 4 refills | Status: DC

## 2020-02-28 MED ORDER — ATORVASTATIN CALCIUM 40 MG PO TABS: 40.0000 mg | ORAL_TABLET | Freq: Every day | ORAL | 4 refills | Status: DC

## 2020-02-28 MED ORDER — HYDROCHLOROTHIAZIDE 25 MG PO TABS: 25.0000 mg | ORAL_TABLET | Freq: Every day | ORAL | 4 refills | Status: DC

## 2020-02-28 MED ORDER — METOPROLOL SUCCINATE ER 50 MG PO TB24: 50.0000 mg | ORAL_TABLET | Freq: Every day | ORAL | 4 refills | Status: DC

## 2020-02-28 NOTE — Telephone Encounter (Signed)
Patient last seen in March and has f/up 03/27/20.

## 2020-02-28 NOTE — Telephone Encounter (Signed)
Please find out if she has enough to get to her appointment.  

## 2020-03-06 ENCOUNTER — Ambulatory Visit: Payer: Medicare Other

## 2020-03-06 ENCOUNTER — Ambulatory Visit: Payer: Medicare Other | Admitting: Nurse Practitioner

## 2020-03-13 ENCOUNTER — Ambulatory Visit: Payer: Medicare Other

## 2020-03-13 ENCOUNTER — Ambulatory Visit: Payer: Medicare Other | Admitting: Nurse Practitioner

## 2020-03-20 ENCOUNTER — Ambulatory Visit: Payer: Medicare Other | Admitting: Nurse Practitioner

## 2020-03-20 ENCOUNTER — Ambulatory Visit: Payer: Medicare Other

## 2020-03-23 ENCOUNTER — Encounter: Payer: Self-pay | Admitting: Nurse Practitioner

## 2020-03-23 DIAGNOSIS — I77811 Abdominal aortic ectasia: Secondary | ICD-10-CM | POA: Insufficient documentation

## 2020-03-24 ENCOUNTER — Ambulatory Visit (INDEPENDENT_AMBULATORY_CARE_PROVIDER_SITE_OTHER): Payer: Medicare Other

## 2020-03-24 VITALS — Ht 61.0 in | Wt 215.0 lb

## 2020-03-24 DIAGNOSIS — Z0000 Encounter for general adult medical examination without abnormal findings: Secondary | ICD-10-CM | POA: Diagnosis not present

## 2020-03-24 NOTE — Patient Instructions (Signed)
Annette Porter , Thank you for taking time to come for your Medicare Wellness Visit. I appreciate your ongoing commitment to your health goals. Please review the following plan we discussed and let me know if I can assist you in the future.   Screening recommendations/referrals: Colonoscopy: completed 05/31/2015 Mammogram: scheduled for 04/06/2020 Bone Density: completed 05/07/2017 Recommended yearly ophthalmology/optometry visit for glaucoma screening and checkup Recommended yearly dental visit for hygiene and checkup  Vaccinations: Influenza vaccine: due Pneumococcal vaccine: completed 12/21/2014 Tdap vaccine: completed 12/21/2014 Shingles vaccine: completed    Covid-19: 09/07/2019, 08/16/2019  Advanced directives: Advance directive discussed with you today. .   Conditions/risks identified: none  Next appointment: Follow up in one year for your annual wellness visit    Preventive Care 65 Years and Older, Female Preventive care refers to lifestyle choices and visits with your health care provider that can promote health and wellness. What does preventive care include?  A yearly physical exam. This is also called an annual well check.  Dental exams once or twice a year.  Routine eye exams. Ask your health care provider how often you should have your eyes checked.  Personal lifestyle choices, including:  Daily care of your teeth and gums.  Regular physical activity.  Eating a healthy diet.  Avoiding tobacco and drug use.  Limiting alcohol use.  Practicing safe sex.  Taking low-dose aspirin every day.  Taking vitamin and mineral supplements as recommended by your health care provider. What happens during an annual well check? The services and screenings done by your health care provider during your annual well check will depend on your age, overall health, lifestyle risk factors, and family history of disease. Counseling  Your health care provider may ask you questions  about your:  Alcohol use.  Tobacco use.  Drug use.  Emotional well-being.  Home and relationship well-being.  Sexual activity.  Eating habits.  History of falls.  Memory and ability to understand (cognition).  Work and work Statistician.  Reproductive health. Screening  You may have the following tests or measurements:  Height, weight, and BMI.  Blood pressure.  Lipid and cholesterol levels. These may be checked every 5 years, or more frequently if you are over 81 years old.  Skin check.  Lung cancer screening. You may have this screening every year starting at age 60 if you have a 30-pack-year history of smoking and currently smoke or have quit within the past 15 years.  Fecal occult blood test (FOBT) of the stool. You may have this test every year starting at age 66.  Flexible sigmoidoscopy or colonoscopy. You may have a sigmoidoscopy every 5 years or a colonoscopy every 10 years starting at age 56.  Hepatitis C blood test.  Hepatitis B blood test.  Sexually transmitted disease (STD) testing.  Diabetes screening. This is done by checking your blood sugar (glucose) after you have not eaten for a while (fasting). You may have this done every 1-3 years.  Bone density scan. This is done to screen for osteoporosis. You may have this done starting at age 52.  Mammogram. This may be done every 1-2 years. Talk to your health care provider about how often you should have regular mammograms. Talk with your health care provider about your test results, treatment options, and if necessary, the need for more tests. Vaccines  Your health care provider may recommend certain vaccines, such as:  Influenza vaccine. This is recommended every year.  Tetanus, diphtheria, and acellular pertussis (Tdap, Td)  vaccine. You may need a Td booster every 10 years.  Zoster vaccine. You may need this after age 57.  Pneumococcal 13-valent conjugate (PCV13) vaccine. One dose is  recommended after age 61.  Pneumococcal polysaccharide (PPSV23) vaccine. One dose is recommended after age 8. Talk to your health care provider about which screenings and vaccines you need and how often you need them. This information is not intended to replace advice given to you by your health care provider. Make sure you discuss any questions you have with your health care provider. Document Released: 06/23/2015 Document Revised: 02/14/2016 Document Reviewed: 03/28/2015 Elsevier Interactive Patient Education  2017 [place] Prevention in the Home Falls can cause injuries. They can happen to people of all ages. There are many things you can do to make your home safe and to help prevent falls. What can I do on the outside of my home?  Regularly fix the edges of walkways and driveways and fix any cracks.  Remove anything that might make you trip as you walk through a door, such as a raised step or threshold.  Trim any bushes or trees on the path to your home.  Use bright outdoor lighting.  Clear any walking paths of anything that might make someone trip, such as rocks or tools.  Regularly check to see if handrails are loose or broken. Make sure that both sides of any steps have handrails.  Any raised decks and porches should have guardrails on the edges.  Have any leaves, snow, or ice cleared regularly.  Use sand or salt on walking paths during winter.  Clean up any spills in your garage right away. This includes oil or grease spills. What can I do in the bathroom?  Use night lights.  Install grab bars by the toilet and in the tub and shower. Do not use towel bars as grab bars.  Use non-skid mats or decals in the tub or shower.  If you need to sit down in the shower, use a plastic, non-slip stool.  Keep the floor dry. Clean up any water that spills on the floor as soon as it happens.  Remove soap buildup in the tub or shower regularly.  Attach bath mats  securely with double-sided non-slip rug tape.  Do not have throw rugs and other things on the floor that can make you trip. What can I do in the bedroom?  Use night lights.  Make sure that you have a light by your bed that is easy to reach.  Do not use any sheets or blankets that are too big for your bed. They should not hang down onto the floor.  Have a firm chair that has side arms. You can use this for support while you get dressed.  Do not have throw rugs and other things on the floor that can make you trip. What can I do in the kitchen?  Clean up any spills right away.  Avoid walking on wet floors.  Keep items that you use a lot in easy-to-reach places.  If you need to reach something above you, use a strong step stool that has a grab bar.  Keep electrical cords out of the way.  Do not use floor polish or wax that makes floors slippery. If you must use wax, use non-skid floor wax.  Do not have throw rugs and other things on the floor that can make you trip. What can I do with my stairs?  Do not leave  any items on the stairs.  Make sure that there are handrails on both sides of the stairs and use them. Fix handrails that are broken or loose. Make sure that handrails are as long as the stairways.  Check any carpeting to make sure that it is firmly attached to the stairs. Fix any carpet that is loose or worn.  Avoid having throw rugs at the top or bottom of the stairs. If you do have throw rugs, attach them to the floor with carpet tape.  Make sure that you have a light switch at the top of the stairs and the bottom of the stairs. If you do not have them, ask someone to add them for you. What else can I do to help prevent falls?  Wear shoes that:  Do not have high heels.  Have rubber bottoms.  Are comfortable and fit you well.  Are closed at the toe. Do not wear sandals.  If you use a stepladder:  Make sure that it is fully opened. Do not climb a closed  stepladder.  Make sure that both sides of the stepladder are locked into place.  Ask someone to hold it for you, if possible.  Clearly mark and make sure that you can see:  Any grab bars or handrails.  First and last steps.  Where the edge of each step is.  Use tools that help you move around (mobility aids) if they are needed. These include:  Canes.  Walkers.  Scooters.  Crutches.  Turn on the lights when you go into a dark area. Replace any light bulbs as soon as they burn out.  Set up your furniture so you have a clear path. Avoid moving your furniture around.  If any of your floors are uneven, fix them.  If there are any pets around you, be aware of where they are.  Review your medicines with your doctor. Some medicines can make you feel dizzy. This can increase your chance of falling. Ask your doctor what other things that you can do to help prevent falls. This information is not intended to replace advice given to you by your health care provider. Make sure you discuss any questions you have with your health care provider. Document Released: 03/23/2009 Document Revised: 11/02/2015 Document Reviewed: 07/01/2014 Elsevier Interactive Patient Education  2017 [employer].

## 2020-03-24 NOTE — Progress Notes (Signed)
I connected with Annette Porter today by telephone and verified that I am speaking with the correct person using two identifiers. Location patient: home Location provider: work Persons participating in the virtual visit: Annette Porter, [name] LPN.   I discussed the limitations, risks, security and privacy concerns of performing an evaluation and management service by telephone and the availability of in person appointments. I also discussed with the patient that there may be a patient responsible charge related to this service. The patient expressed understanding and verbally consented to this telephonic visit.    Interactive audio and video telecommunications were attempted between this provider and patient, however failed, due to patient having technical difficulties OR patient did not have access to video capability.  We continued and completed visit with audio only.     Vital signs may be patient reported or missing.  Subjective:   Annette Porter is a 69 y.o. female who presents for Medicare Annual (Subsequent) preventive examination.  Review of Systems     Cardiac Risk Factors include: advanced age (>43men, >64 women);dyslipidemia;obesity (BMI >30kg/m2);sedentary lifestyle     Objective:    Today's Vitals   03/24/20 0811  Weight: 215 lb (97.5 kg)  Height: 5\' 1"  (1.549 m)   Body mass index is 40.62 kg/m.  Advanced Directives 03/24/2020 02/17/2019 11/30/2018 01/26/2018 10/24/2017 09/22/2017 01/22/2017  Does Patient Have a Medical Advance Directive? No No No No No No No  Would patient like information on creating a medical advance directive? - - No - Patient declined Yes (MAU/Ambulatory/Procedural Areas - Information given) - No - Patient declined Yes (MAU/Ambulatory/Procedural Areas - Information given)    Current Medications (verified) Outpatient Encounter Medications as of 03/24/2020  Medication Sig  . aspirin 81 MG tablet Take 81 mg by mouth daily.  Marland Kitchen atorvastatin  (LIPITOR) 40 MG tablet Take 1 tablet (40 mg total) by mouth daily.  . Calcium Carbonate (CALCIUM 600 PO) Take 1 tablet by mouth 2 (two) times daily.  . Cholecalciferol (VITAMIN D3) 1000 UNITS CAPS Take 1 capsule by mouth daily.  Marland Kitchen docusate sodium (COLACE) 100 MG capsule Take 100 mg by mouth daily.  Marland Kitchen ezetimibe (ZETIA) 10 MG tablet Take 1 tablet (10 mg total) by mouth daily.  . hydrochlorothiazide (HYDRODIURIL) 25 MG tablet Take 1 tablet (25 mg total) by mouth daily.  Marland Kitchen ibuprofen (ADVIL) 800 MG tablet TAKE 1 TABLET BY MOUTH 3  TIMES DAILY AS NEEDED  . isosorbide mononitrate (IMDUR) 30 MG 24 hr tablet Take 30 mg by mouth daily.  . isosorbide mononitrate (IMDUR) 60 MG 24 hr tablet TAKE 1 TABLET BY MOUTH ONCE DAILY  . letrozole (FEMARA) 2.5 MG tablet Take 1 tablet (2.5 mg total) by mouth daily.  Marland Kitchen levothyroxine (SYNTHROID) 112 MCG tablet TAKE 1 TABLET BY MOUTH  DAILY  . metoprolol succinate (TOPROL-XL) 50 MG 24 hr tablet Take 1 tablet (50 mg total) by mouth daily.  . nitroGLYCERIN (NITROSTAT) 0.4 MG SL tablet Place 0.4 mg under the tongue every 5 (five) minutes as needed for chest pain.  . ranitidine (ZANTAC) 150 MG tablet Take 150 mg by mouth at bedtime.   . vitamin B-12 (CYANOCOBALAMIN) 250 MCG tablet Take 500 mcg by mouth daily.    No facility-administered encounter medications on file as of 03/24/2020.    Allergies (verified) Benazepril, Dilaudid [hydromorphone], Hydrocodone, and Tape   History: Past Medical History:  Diagnosis Date  . 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 CAD (coronary artery disease)   . GERD (gastroesophageal reflux disease)   . Hyperlipidemia   . Hypertension   . Hypothyroidism   . Morbid obesity ([place])   . Myocardial infarction ([place]) 07/1998  . Personal history of radiation therapy 2014 and 2015   mammostite for breast ca   Past Surgical History:  Procedure Laterality Date   . BREAST BIOPSY Left 03/22/13    DCIS at 3:00  . BREAST BIOPSY Left 11/11/13   INVASIVE MAMMARY CARCINOMA at 8:00  . BREAST LUMPECTOMY Left 04/29/2013   High grade DCIS, 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  Marland Kitchen BREAST LUMPECTOMY Left 12/16/2013   IMC 11 mm ER/ PR positive, Her 2 neu negative, T1c, N0. Mammosite radiation.   Marland Kitchen BREAST LUMPECTOMY WITH MAMMOSITE CAVITY EVALUATION DEVICE Left 2014  . BREAST LUMPECTOMY WITH MAMMOSITE CAVITY EVALUATION DEVICE Left 2015  . BREAST MAMMOSITE  05/20/13  . BREAST SURGERY Left 04/29/13   wide excision  . BREAST SURGERY Left 12-16-13   Wide excision with SN biopsy, INVASIVE MAMMARY CARCINOMA  . CARDIAC CATHETERIZATION    . COLONOSCOPY  2006  . COLONOSCOPY WITH PROPOFOL N/A 05/31/2015   Procedure: COLONOSCOPY WITH PROPOFOL;  Surgeon: Robert Bellow, MD;  Location: [facility] ENDOSCOPY;  Service: Endoscopy;  Laterality: N/A;  . HAND SURGERY    . KNEE ARTHROSCOPY WITH MEDIAL MENISECTOMY Left 02/24/2015   Procedure: KNEE ARTHROSCOPY WITH PARTIAL MEDIAL MENISECTOMY;  Surgeon: Christophe Louis, MD;  Location: [facility] ORS;  Service: Orthopedics;  Laterality: Left;   Family History  Problem Relation Age of Onset  . Brain cancer Father   . Thyroid disease Sister   . Diabetes Maternal Uncle   . Thyroid disease Paternal Aunt   . Thyroid disease Sister   . Thyroid disease Sister   . Breast cancer Neg Hx    Social History   Socioeconomic History  . Marital status: Married    Spouse name: Not on file  . Number of children: Not on file  . Years of education: Not on file  . Highest education level: Some college, no degree  Occupational History  . Occupation: retired  Tobacco Use  . Smoking status: Never Smoker  . Smokeless tobacco: Never Used  Vaping Use  . Vaping Use: Never used  Substance and Sexual Activity  . Alcohol use: No  . Drug use: No  . Sexual activity: Not on file  Other Topics Concern  . Not on file    Social History Narrative  . Not on file   Social Determinants of Health   Financial Resource Strain: Low Risk   . Difficulty of Paying Living Expenses: Not hard at all  Food Insecurity: No Food Insecurity  . Worried About Charity fundraiser in the Last Year: Never true  . Ran Out of Food in the Last Year: Never true  Transportation Needs: No Transportation Needs  . Lack of Transportation (Medical): No  . Lack of Transportation (Non-Medical): No  Physical Activity: Inactive  . Days of Exercise per Week: 0 days  . Minutes of Exercise per Session: 0 min  Stress: No Stress Concern Present  . Feeling of Stress : Not at all  Social Connections:   . Frequency of Communication with Friends and Family: Not on file  . Frequency of Social Gatherings with Friends and Family: Not on file  . Attends Religious Services: Not on  file  . Active Member of Clubs or Organizations: Not on file  . Attends Archivist Meetings: Not on file  . Marital Status: Not on file    Tobacco Counseling Counseling given: Not Answered   Clinical Intake:  Pre-visit preparation completed: Yes  Pain : No/denies pain     Nutritional Status: BMI > 30  Obese Nutritional Risks: None Diabetes: No  How often do you need to have someone help you when you read instructions, pamphlets, or other written materials from your doctor or pharmacy?: 1 - Never What is the last grade level you completed in school?: 46yr college  Diabetic?no  Interpreter Needed?: No  Information entered by :: NAllen LPN   Activities of Daily Living In your present state of health, do you have any difficulty performing the following activities: 03/24/2020  Hearing? N  Vision? N  Difficulty concentrating or making decisions? N  Walking or climbing stairs? N  Dressing or bathing? N  Doing errands, shopping? N  Preparing Food and eating ? N  Using the Toilet? N  In the past six months, have you accidently leaked urine? N   Do you have problems with loss of bowel control? N  Managing your Medications? N  Managing your Finances? N  Housekeeping or managing your Housekeeping? N  Some recent data might be hidden    Patient Care Team: Guadalupe Maple, MD as PCP - General (Family Medicine) Bary Castilla, Forest Gleason, MD (General Surgery) Guadalupe Maple, MD (Family Medicine) Noreene Filbert, MD as Referring Physician (Radiation Oncology) Lollie Sails, MD (Inactive) as Consulting Physician (Gastroenterology) Teodoro Spray, MD as Consulting Physician (Cardiology)  Indicate any recent Medical Services you may have received from other than [facility] providers in the past year (date may be approximate).     Assessment:   This is a routine wellness examination for Eliabeth.  Hearing/Vision screen  Hearing Screening   125Hz  250Hz  500Hz  1000Hz  2000Hz  3000Hz  4000Hz  6000Hz  8000Hz   Right ear:           Left ear:           Vision Screening Comments: Regular eye exams, [facility]  Dietary issues and exercise activities discussed: Current Exercise Habits: The patient does not participate in regular exercise at present  Goals    . DIET - INCREASE WATER INTAKE     Recommend drinking at least 6-8 glasses of water a day    . Patient Stated     03/24/2020, wants to lose 25-30 pounds      Depression Screen PHQ 2/9 Scores 03/24/2020 02/17/2019 01/26/2018 09/22/2017 07/16/2017 01/22/2017 09/16/2016  PHQ - 2 Score 0 0 0 0 0 0 0    Fall Risk Fall Risk  03/24/2020 04/14/2019 02/17/2019 01/26/2018 09/22/2017  Falls in the past year? 0 0 0 No No  Risk for fall due to : Medication side effect - - - -  Follow up Falls evaluation completed;Education provided;Falls prevention discussed - - - -    Any stairs in or around the home? Yes  If so, are there any without handrails? No  Home free of loose throw rugs in walkways, pet beds, electrical cords, etc? Yes  Adequate lighting in your home to reduce risk of falls? Yes   ASSISTIVE  DEVICES UTILIZED TO PREVENT FALLS:  Life alert? No  Use of a cane, walker or w/c? No  Grab bars in the bathroom? No  Shower chair or bench in shower? Yes  Elevated toilet seat or a handicapped toilet? No   TIMED UP AND GO:  Was the test performed? No .   Cognitive Function:     6CIT Screen 03/24/2020 01/26/2018 01/22/2017  What Year? 0 points 0 points 0 points  What month? 0 points 0 points 0 points  What time? 0 points 0 points 0 points  Count back from 20 0 points 0 points 0 points  Months in reverse 0 points 0 points 0 points  Repeat phrase 0 points 0 points 0 points  Total Score 0 0 0    Immunizations Immunization History  Administered Date(s) Administered  . Fluad Quad(high Dose 65+) 02/17/2019  . Influenza, High Dose Seasonal PF 03/19/2016, 03/17/2017, 03/26/2018  . Influenza,inj,Quad PF,6+ Mos 03/22/2015  . PFIZER SARS-COV-2 Vaccination 08/16/2019, 09/07/2019  . Pneumococcal Conjugate-13 12/21/2014  . Pneumococcal-Unspecified 04/12/2004, 06/26/2009  . Td 04/29/2005, 12/21/2014    TDAP status: Up to date Flu Vaccine status: Up to date Pneumococcal vaccine status: Up to date Covid-19 vaccine status: Completed vaccines  Qualifies for Shingles Vaccine? Yes   Zostavax completed Yes   Shingrix Completed?: Yes  Screening Tests Health Maintenance  Topic Date Due  . URINE MICROALBUMIN  08/17/2019  . INFLUENZA VACCINE  01/09/2020  . MAMMOGRAM  04/05/2021  . TETANUS/TDAP  12/20/2024  . COLONOSCOPY  05/30/2025  . DEXA SCAN  Completed  . COVID-19 Vaccine  Completed  . Hepatitis C Screening  Completed  . PNA vac Low Risk Adult  Completed    Health Maintenance  Health Maintenance Due  Topic Date Due  . URINE MICROALBUMIN  08/17/2019  . INFLUENZA VACCINE  01/09/2020    Colorectal cancer screening: Completed 05/31/2015. Repeat every 10 years Mammogram status: scheduled 04/06/2020 Bone Density status: Completed 05/07/2017.   Lung Cancer Screening: (Low Dose  CT Chest recommended if Age 28-80 years, 30 pack-year currently smoking OR have quit w/in 15years.) does not qualify.   Lung Cancer Screening Referral: no  Additional Screening:  Hepatitis C Screening: does qualify; Completed 01/22/2017  Vision Screening: Recommended annual ophthalmology exams for early detection of glaucoma and other disorders of the eye. Is the patient up to date with their annual eye exam?  Yes  Who is the provider or what is the name of the office in which the patient attends annual eye exams? [facility] If pt is not established with a provider, would they like to be referred to a provider to establish care? No .   Dental Screening: Recommended annual dental exams for proper oral hygiene  Community Resource Referral / Chronic Care Management: CRR required this visit?  No   CCM required this visit?  No      Plan:     I have personally reviewed and noted the following in the patient's chart:   . Medical and social history . Use of alcohol, tobacco or illicit drugs  . Current medications and supplements . Functional ability and status . Nutritional status . Physical activity . Advanced directives . List of other physicians . Hospitalizations, surgeries, and ER visits in previous 12 months . Vitals . Screenings to include cognitive, depression, and falls . Referrals and appointments  In addition, I have reviewed and discussed with patient certain preventive protocols, quality metrics, and best practice recommendations. A written personalized care plan for preventive services as well as general preventive health recommendations were provided to patient.     Kellie Simmering, LPN   [id number]   Nurse Notes:

## 2020-03-27 ENCOUNTER — Encounter: Payer: Self-pay | Admitting: Nurse Practitioner

## 2020-03-27 ENCOUNTER — Ambulatory Visit (INDEPENDENT_AMBULATORY_CARE_PROVIDER_SITE_OTHER): Payer: Medicare Other | Admitting: Nurse Practitioner

## 2020-03-27 ENCOUNTER — Other Ambulatory Visit: Payer: Self-pay

## 2020-03-27 DIAGNOSIS — Z853 Personal history of malignant neoplasm of breast: Secondary | ICD-10-CM

## 2020-03-27 DIAGNOSIS — E039 Hypothyroidism, unspecified: Secondary | ICD-10-CM | POA: Diagnosis not present

## 2020-03-27 DIAGNOSIS — I10 Essential (primary) hypertension: Secondary | ICD-10-CM | POA: Diagnosis not present

## 2020-03-27 DIAGNOSIS — R059 Cough, unspecified: Secondary | ICD-10-CM | POA: Diagnosis not present

## 2020-03-27 DIAGNOSIS — K219 Gastro-esophageal reflux disease without esophagitis: Secondary | ICD-10-CM | POA: Diagnosis not present

## 2020-03-27 DIAGNOSIS — I2510 Atherosclerotic heart disease of native coronary artery without angina pectoris: Secondary | ICD-10-CM | POA: Diagnosis not present

## 2020-03-27 DIAGNOSIS — Z23 Encounter for immunization: Secondary | ICD-10-CM | POA: Diagnosis not present

## 2020-03-27 DIAGNOSIS — I2583 Coronary atherosclerosis due to lipid rich plaque: Secondary | ICD-10-CM | POA: Diagnosis not present

## 2020-03-27 DIAGNOSIS — Z8616 Personal history of COVID-19: Secondary | ICD-10-CM | POA: Insufficient documentation

## 2020-03-27 DIAGNOSIS — E782 Mixed hyperlipidemia: Secondary | ICD-10-CM

## 2020-03-27 DIAGNOSIS — I77811 Abdominal aortic ectasia: Secondary | ICD-10-CM

## 2020-03-27 NOTE — Assessment & Plan Note (Addendum)
Continue collaboration with Dr. Bary Castilla, with surgical team, and oncology team.

## 2020-03-27 NOTE — Assessment & Plan Note (Addendum)
Ongoing for years, suspect related to underlying allergic rhinitis.  No red flag symptoms.  Will trial taking daily OTC Claritin or Allegra + using Flonase OTC.  Discussed with patient and provided education.  Avoid Benadryl products.  Obtain labs today to include CBC and CMP.  Will have return in 8 weeks, if ongoing consider imaging.

## 2020-03-27 NOTE — Assessment & Plan Note (Signed)
Chronic, continue ASA and statin.  Continue collaboration with cardiology.

## 2020-03-27 NOTE — Assessment & Plan Note (Signed)
Ongoing, possible cause of ongoing cough for years.  Recommend she start taking OTC Pepcid daily, if ongoing issues may consider trial of PPI.

## 2020-03-27 NOTE — Assessment & Plan Note (Signed)
Chronic, ongoing.  Continue current medication regimen and adjust as needed. Lipid panel today. 

## 2020-03-27 NOTE — Assessment & Plan Note (Signed)
Ongoing with no symptoms.  Repeat US ordered and discussed with patient.  Recommend she continue daily statin and ASA for prevention.  Discussed BP control and continuing medications.

## 2020-03-27 NOTE — Assessment & Plan Note (Signed)
Chronic, ongoing.  Continue current medication regimen and adjust as needed based on thyroid labs.  Obtain labs today.

## 2020-03-27 NOTE — Assessment & Plan Note (Signed)
BMI 41.47 with HTN, HLD, CAD.  Recommended eating smaller high protein, low fat meals more frequently and exercising 30 mins a day 5 times a week with a goal of 10-15lb weight loss in the next 3 months. Patient voiced their understanding and motivation to adhere to these recommendations.

## 2020-03-27 NOTE — Patient Instructions (Signed)
Allergies, Adult An allergy means that your body reacts to something that bothers it (allergen). It is not a normal reaction. This can happen from something that you:  Eat.  Breathe in.  Touch. You can have an allergy (be allergic) to:  Outdoor things, like: ? Pollen. ? Grass. ? Weeds.  Indoor things, like: ? Dust. ? Smoke. ? Pet dander.  Foods.  Medicines.  Things that bother your skin, like: ? Detergents. ? Chemicals. ? Latex.  Perfume.  Bugs. An allergy cannot spread from person to person (is not contagious). Follow these instructions at home:         Stay away from things that you know you are allergic to.  If you have allergies to things in the air, wash out your nose each day. Do it with one of these: ? A salt-water (saline) spray. ? A container (neti pot).  Take over-the-counter and prescription medicines only as told by your doctor.  Keep all follow-up visits as told by your doctor. This is important.  If you are at risk for a very bad allergy reaction (anaphylaxis), keep an auto-injector with you all the time. This is called an epinephrine injection. ? This is pre-measured medicine with a needle. You can put it into your skin by yourself. ? Right after you have a very bad allergy reaction, you or a person with you must give the medicine in less than a few minutes. This is an emergency.  If you have ever had a very bad allergy reaction, wear a medical alert bracelet or necklace. Your very bad allergy should be written on it. Contact a health care provider if:  Your symptoms do not get better with treatment. Get help right away if:  You have symptoms of a very bad allergy reaction. These include: ? A swollen mouth, tongue, or throat. ? Pain or tightness in your chest. ? Trouble breathing. ? Being short of breath. ? Dizziness. ? Fainting. ? Very bad pain in your belly (abdomen). ? Throwing up (vomiting). ? Watery poop  (diarrhea). Summary  An allergy means that your body reacts to something that bothers it (allergen). It is not a normal reaction.  Stay away from things that make your body react.  Take over-the-counter and prescription medicines only as told by your doctor.  If you are at risk for a very bad allergy reaction, carry an auto-injector (epinephrine injection) all the time. Also, wear a medical alert bracelet or necklace so people know about your allergy. This information is not intended to replace advice given to you by your health care provider. Make sure you discuss any questions you have with your health care provider. Document Revised: 09/15/2018 Document Reviewed: 09/09/2016 Elsevier Patient Education  [place].

## 2020-03-27 NOTE — Progress Notes (Signed)
BP 126/72   Pulse 60   Temp 98.1 F (36.7 C) (Oral)   Resp 16   Ht 5\' 1"  (1.549 m)   Wt 220 lb (99.8 kg)   SpO2 98%   BMI 41.57 kg/m    Subjective:    Patient ID: Annette Porter, female    DOB: [birth year], 69 y.o.   MRN: [id number]  HPI: Annette Porter is a 69 y.o. female  Chief Complaint  Patient presents with  . Hypertension  . Hyperlipidemia  . Hypothyroidism   HYPERTENSION / HYPERLIPIDEMIA Continues on Isosorbide 24HR -- 60 MG + 30 MG, HCTZ, Metoprolol, ASA, Atorvastatin, and Zetia. Has NTG -- has not used recently, has been several months.  Followed by cardiology, Dr. Ubaldo Glassing, and last saw 01/18/2020.  Has history of MI in 2005 and CAD.  In July 2016 had an abdominal US noting abdominal aortic ectasia and is recommended to repeat in 5 years -- discussed with her today and educated.   Satisfied with current treatment? yes Duration of hypertension: chronic BP monitoring frequency: not checking BP range:  BP medication side effects: no Duration of hyperlipidemia: chronic Cholesterol medication side effects: no Cholesterol supplements: none Medication compliance: good compliance Aspirin: yes Recent stressors: no Recurrent headaches: no Visual changes: no Palpitations: no Dyspnea: no Chest pain: no Lower extremity edema: no Dizzy/lightheaded: no   HYPOTHYROIDISM Recent TSH 0.518 in March 2021.  Continues on Levothyroxine 112 MCG.   Thyroid control status:stable Satisfied with current treatment? yes Medication side effects: no Medication compliance: good compliance Etiology of hypothyroidism:  Recent dose adjustment:no Fatigue: no Cold intolerance: no Heat intolerance: no Weight gain: no Weight loss: no Constipation: no Diarrhea/loose stools: no Palpitations: no Lower extremity edema: no Anxiety/depressed mood: no   COUGH Has been ongoing for some time.  Like a tickle in throat.  Years ago had bronchitis and reports the mild cough has been present  on/off since then.  Does endorse reflux issues in past and not currently taking consistently.  Has history of bad sinus infections in the fall -- recurrent in the fall.  Not taking any allergy medications.  Denies night sweats, fever, decreased appetite.  Does report she is frequently clearing throat.  She reports recently having changes in smell, states she smells sulfa frequently -- not everything smells like this and she can not peg a time of day it presents more.  No history of Covid or recent URI.  Does have history of radiation with breast CA.   Duration: months Circumstances of initial development of cough: unknown Cough severity: mild Cough description: non-productive Aggravating factors:  nothing Alleviating factors: nothing Status:  stable Treatments attempted: none Wheezing: no Shortness of breath: no Chest pain: no Chest tightness:no Nasal congestion: no Runny nose: no Postnasal drip: yes Frequent throat clearing or swallowing: no Hemoptysis: no Fevers: no Night sweats: no Weight loss: no Heartburn: yes Recent foreign travel: no Tuberculosis contacts: no  HISTORY OF BREAST CA: Had in 2014 and 2015, radiation both times.  Follow-up visits with Dr. Bary Castilla, has upcoming follow-up.    Relevant past medical, surgical, family and social history reviewed and updated as indicated. Interim medical history since our last visit reviewed. Allergies and medications reviewed and updated.  Review of Systems  Constitutional: Negative for activity change, appetite change, diaphoresis, fatigue and fever.  Respiratory: Negative for cough, chest tightness, shortness of breath and wheezing.   Cardiovascular: Negative for chest pain, palpitations and leg swelling.  Gastrointestinal: Negative.  Neurological: Negative.   Psychiatric/Behavioral: Negative.     Per HPI unless specifically indicated above     Objective:    BP 126/72   Pulse 60   Temp 98.1 F (36.7 C) (Oral)    Resp 16   Ht 5\' 1"  (1.549 m)   Wt 220 lb (99.8 kg)   SpO2 98%   BMI 41.57 kg/m   Wt Readings from Last 3 Encounters:  03/27/20 220 lb (99.8 kg)  03/24/20 215 lb (97.5 kg)  08/16/19 216 lb (98 kg)    Physical Exam Vitals and nursing note reviewed.  Constitutional:      General: She is awake. She is not in acute distress.    Appearance: She is well-developed. She is morbidly obese. She is not ill-appearing.  HENT:     Head: Normocephalic.     Right Ear: Hearing normal.     Left Ear: Hearing normal.  Eyes:     General: Lids are normal.        Right eye: No discharge.        Left eye: No discharge.     Conjunctiva/sclera: Conjunctivae normal.     Pupils: Pupils are equal, round, and reactive to light.  Neck:     Thyroid: No thyromegaly.     Vascular: No carotid bruit.  Cardiovascular:     Rate and Rhythm: Normal rate and regular rhythm.     Heart sounds: Normal heart sounds. No murmur heard.  No gallop.   Pulmonary:     Effort: Pulmonary effort is normal. No accessory muscle usage or respiratory distress.     Breath sounds: Normal breath sounds.  Abdominal:     General: Bowel sounds are normal.     Palpations: Abdomen is soft.  Musculoskeletal:     Cervical back: Normal range of motion and neck supple.     Right lower leg: No edema.     Left lower leg: No edema.  Skin:    General: Skin is warm and dry.  Neurological:     Mental Status: She is alert and oriented to person, place, and time.  Psychiatric:        Attention and Perception: Attention normal.        Mood and Affect: Mood normal.        Speech: Speech normal.        Behavior: Behavior normal. Behavior is cooperative.    Results for orders placed or performed in visit on 08/16/19  Thyroid Panel With TSH  Result Value Ref Range   TSH 0.518 0.450 - 4.500 uIU/mL   T4, Total 8.1 4.5 - 12.0 ug/dL   T3 Uptake Ratio 27 24 - 39 %   Free Thyroxine Index 2.2 1.2 - 4.9  Comprehensive metabolic panel  Result  Value Ref Range   Glucose 87 65 - 99 mg/dL   BUN 17 8 - 27 mg/dL   Creatinine, Ser 0.70 0.57 - 1.00 mg/dL   GFR calc non Af Amer 89 >59 mL/min/1.73   GFR calc Af Amer 102 >59 mL/min/1.73   BUN/Creatinine Ratio 24 12 - 28   Sodium 137 134 - 144 mmol/L   Potassium 4.6 3.5 - 5.2 mmol/L   Chloride 100 96 - 106 mmol/L   CO2 23 20 - 29 mmol/L   Calcium 9.1 8.7 - 10.3 mg/dL   Total Protein 6.6 6.0 - 8.5 g/dL   Albumin 4.1 3.8 - 4.8 g/dL   Globulin, Total 2.5 1.5 - 4.5  g/dL   Albumin/Globulin Ratio 1.6 1.2 - 2.2   Bilirubin Total 0.3 0.0 - 1.2 mg/dL   Alkaline Phosphatase 85 39 - 117 IU/L   AST 26 0 - 40 IU/L   ALT 33 (H) 0 - 32 IU/L  Lipid Panel w/o Chol/HDL Ratio  Result Value Ref Range   Cholesterol, Total 142 100 - 199 mg/dL   Triglycerides 114 0 - 149 mg/dL   HDL 50 >39 mg/dL   VLDL Cholesterol Cal 21 5 - 40 mg/dL   LDL Chol Calc (NIH) 71 0 - 99 mg/dL      Assessment & Plan:   Problem List Items Addressed This Visit      Cardiovascular and Mediastinum   Hypertension    Chronic, ongoing.  Initial BP level elevated, but recheck at goal.  Recommend she monitor BP at least a few mornings a week at home and document.  DASH diet at home.  Continue current medication regimen and adjust as needed.  Labs today.  Return in 6 months.      Relevant Orders   Basic metabolic panel   CAD (coronary atherosclerotic disease)    Chronic, continue ASA and statin.  Continue collaboration with cardiology.      Abdominal aortic ectasia (HCC)    Ongoing with no symptoms.  Repeat US ordered and discussed with patient.  Recommend she continue daily statin and ASA for prevention.  Discussed BP control and continuing medications.      Relevant Orders   VAS [place] AAA DUPLEX     Digestive   GERD (gastroesophageal reflux disease)    Ongoing, possible cause of ongoing cough for years.  Recommend she start taking OTC Pepcid daily, if ongoing issues may consider trial of PPI.        Relevant  Medications   famotidine (PEPCID) 40 MG tablet     Endocrine   Hypothyroidism    Chronic, ongoing.  Continue current medication regimen and adjust as needed based on thyroid labs.  Obtain labs today.      Relevant Orders   TSH   T4, free     Other   History of breast cancer    Continue collaboration with Dr. Bary Castilla, with surgical team, and oncology team.      Hyperlipidemia    Chronic, ongoing.  Continue current medication regimen and adjust as needed.  Lipid panel today.      Relevant Orders   Lipid Panel w/o Chol/HDL Ratio   Morbid obesity (HCC) - Primary    BMI 41.47 with HTN, HLD, CAD.  Recommended eating smaller high protein, low fat meals more frequently and exercising 30 mins a day 5 times a week with a goal of 10-15lb weight loss in the next 3 months. Patient voiced their understanding and motivation to adhere to these recommendations.       Cough    Ongoing for years, suspect related to underlying allergic rhinitis.  No red flag symptoms.  Will trial taking daily OTC Claritin or Allegra + using Flonase OTC.  Discussed with patient and provided education.  Avoid Benadryl products.  Obtain labs today to include CBC and CMP.  Will have return in 8 weeks, if ongoing consider imaging.      Relevant Orders   CBC with Differential/Platelet    Other Visit Diagnoses    Need for influenza vaccination       Flu vaccine today   Relevant Orders   Flu Vaccine QUAD High Dose(Fluad) (Completed)  Follow up plan: Return in about 8 weeks (around 05/22/2020) for Cough.

## 2020-03-27 NOTE — Assessment & Plan Note (Signed)
Chronic, ongoing.  Initial BP level elevated, but recheck at goal.  Recommend she monitor BP at least a few mornings a week at home and document.  DASH diet at home.  Continue current medication regimen and adjust as needed.  Labs today.  Return in 6 months.

## 2020-03-28 LAB — BASIC METABOLIC PANEL
BUN/Creatinine Ratio: 30 — ABNORMAL HIGH (ref 12–28)
BUN: 21 mg/dL (ref 8–27)
CO2: 23 mmol/L (ref 20–29)
Calcium: 9.2 mg/dL (ref 8.7–10.3)
Chloride: 100 mmol/L (ref 96–106)
Creatinine, Ser: 0.7 mg/dL (ref 0.57–1.00)
GFR calc Af Amer: 102 mL/min/{1.73_m2} (ref >59)
GFR calc non Af Amer: 88 mL/min/{1.73_m2} (ref >59)
Glucose: 97 mg/dL (ref 65–99)
Potassium: 4.3 mmol/L (ref 3.5–5.2)
Sodium: 138 mmol/L (ref 134–144)

## 2020-03-28 LAB — CBC WITH DIFFERENTIAL/PLATELET
Basophils Absolute: 0.1 10*3/uL (ref 0.0–0.2)
Basos: 1 %
EOS (ABSOLUTE): 0.2 10*3/uL (ref 0.0–0.4)
Eos: 3 %
Hematocrit: 41.6 % (ref 34.0–46.6)
Hemoglobin: 14 g/dL (ref 11.1–15.9)
Immature Grans (Abs): 0 10*3/uL (ref 0.0–0.1)
Immature Granulocytes: 0 %
Lymphocytes Absolute: 1.8 10*3/uL (ref 0.7–3.1)
Lymphs: 22 %
MCH: 30 pg (ref 26.6–33.0)
MCHC: 33.7 g/dL (ref 31.5–35.7)
MCV: 89 fL (ref 79–97)
Monocytes Absolute: 0.7 10*3/uL (ref 0.1–0.9)
Monocytes: 9 %
Neutrophils Absolute: 5.2 10*3/uL (ref 1.4–7.0)
Neutrophils: 65 %
Platelets: 253 10*3/uL (ref 150–450)
RBC: 4.66 x10E6/uL (ref 3.77–5.28)
RDW: 13.1 % (ref 11.7–15.4)
WBC: 8 10*3/uL (ref 3.4–10.8)

## 2020-03-28 LAB — LIPID PANEL W/O CHOL/HDL RATIO
Cholesterol, Total: 144 mg/dL (ref 100–199)
HDL: 50 mg/dL (ref >39)
LDL Chol Calc (NIH): 75 mg/dL (ref 0–99)
Triglycerides: 105 mg/dL (ref 0–149)
VLDL Cholesterol Cal: 19 mg/dL (ref 5–40)

## 2020-03-28 LAB — T4, FREE: Free T4: 1.21 ng/dL (ref 0.82–1.77)

## 2020-03-28 LAB — TSH: TSH: 0.833 u[IU]/mL (ref 0.450–4.500)

## 2020-03-28 NOTE — Progress Notes (Signed)
Contacted via MyChart  Good afternoon Ms. Coward.  Your labs have returned and overall they look great, no concerns on these.  Continue all current medications.  If any questions let me know:) Keep being awesome!!  Thank you for allowing me to participate in your care. Kindest regards, Rakel Junio

## 2020-04-06 ENCOUNTER — Other Ambulatory Visit: Payer: Self-pay

## 2020-04-06 ENCOUNTER — Ambulatory Visit
Admission: RE | Admit: 2020-04-06 | Discharge: 2020-04-06 | Disposition: A | Discharge status: Home / Self Care | Payer: Medicare Other | Source: Ambulatory Visit | Attending: General Surgery | Admitting: General Surgery

## 2020-04-06 DIAGNOSIS — Z1231 Encounter for screening mammogram for malignant neoplasm of breast: Secondary | ICD-10-CM | POA: Insufficient documentation

## 2020-04-13 DIAGNOSIS — Z853 Personal history of malignant neoplasm of breast: Secondary | ICD-10-CM | POA: Diagnosis not present

## 2020-05-23 ENCOUNTER — Ambulatory Visit
Admission: RE | Admit: 2020-05-23 | Discharge: 2020-05-23 | Disposition: A | Discharge status: Home / Self Care | Payer: Medicare Other | Attending: Nurse Practitioner | Admitting: Nurse Practitioner

## 2020-05-23 ENCOUNTER — Other Ambulatory Visit: Payer: Self-pay

## 2020-05-23 ENCOUNTER — Other Ambulatory Visit: Payer: Self-pay | Admitting: Nurse Practitioner

## 2020-05-23 ENCOUNTER — Encounter: Payer: Self-pay | Admitting: Nurse Practitioner

## 2020-05-23 ENCOUNTER — Ambulatory Visit (INDEPENDENT_AMBULATORY_CARE_PROVIDER_SITE_OTHER): Payer: Medicare Other | Admitting: Nurse Practitioner

## 2020-05-23 ENCOUNTER — Ambulatory Visit
Admission: RE | Admit: 2020-05-23 | Discharge: 2020-05-23 | Disposition: A | Discharge status: Home / Self Care | Payer: Medicare Other | Source: Ambulatory Visit | Attending: Nurse Practitioner | Admitting: Nurse Practitioner

## 2020-05-23 VITALS — BP 115/60 | HR 65 | Temp 98.2°F | Ht 61.18 in | Wt 224.2 lb

## 2020-05-23 DIAGNOSIS — I2510 Atherosclerotic heart disease of native coronary artery without angina pectoris: Secondary | ICD-10-CM

## 2020-05-23 DIAGNOSIS — R059 Cough, unspecified: Secondary | ICD-10-CM

## 2020-05-23 DIAGNOSIS — I2583 Coronary atherosclerosis due to lipid rich plaque: Secondary | ICD-10-CM

## 2020-05-23 DIAGNOSIS — I714 Abdominal aortic aneurysm, without rupture, unspecified: Secondary | ICD-10-CM

## 2020-05-23 DIAGNOSIS — I77811 Abdominal aortic ectasia: Secondary | ICD-10-CM

## 2020-05-23 DIAGNOSIS — J90 Pleural effusion, not elsewhere classified: Secondary | ICD-10-CM | POA: Diagnosis not present

## 2020-05-23 MED ORDER — MONTELUKAST SODIUM 10 MG PO TABS: 10.0000 mg | ORAL_TABLET | Freq: Every day | ORAL | 3 refills | Status: DC

## 2020-05-23 NOTE — Progress Notes (Signed)
Contacted via MyChart   Good afternoon Annette Porter, your imaging overall shows no acute lung issue.  No pneumonia.  It does show the aorta which was slightly enlarged, which was seen on your Medicare screening back in 2016, mildly enlarged abdominal aorta.  It is time for your repeat screening of this, via ultrasound.  This was recommended to repeat in 5 years.  I am going to place this order and you should hear to schedule this.  Any questions for me? Keep being awesome!!  Thank you for allowing me to participate in your care. Kindest regards, Georgios Kina

## 2020-05-23 NOTE — Patient Instructions (Addendum)
IMAGING ---- [address], [place], [place] [address]   Cough, Adult A cough helps to clear your throat and lungs. A cough may be a sign of an illness or another medical condition. An acute cough may only last 2-3 weeks, while a chronic cough may last 8 or more weeks. Many things can cause a cough. They include:  Germs (viruses or bacteria) that attack the airway.  Breathing in things that bother (irritate) your lungs.  Allergies.  Asthma.  Mucus that runs down the back of your throat (postnasal drip).  Smoking.  Acid backing up from the stomach into the tube that moves food from the mouth to the stomach (gastroesophageal reflux).  Some medicines.  Lung problems.  Other medical conditions, such as heart failure or a blood clot in the lung (pulmonary embolism). Follow these instructions at home: Medicines  Take over-the-counter and prescription medicines only as told by your doctor.  Talk with your doctor before you take medicines that stop a cough (coughsuppressants). Lifestyle   Do not smoke, and try not to be around smoke. Do not use any products that contain nicotine or tobacco, such as cigarettes, e-cigarettes, and chewing tobacco. If you need help quitting, ask your doctor.  Drink enough fluid to keep your pee (urine) pale yellow.  Avoid caffeine.  Do not drink alcohol if your doctor tells you not to drink. General instructions   Watch for any changes in your cough. Tell your doctor about them.  Always cover your mouth when you cough.  Stay away from things that make you cough, such as perfume, candles, campfire smoke, or cleaning products.  If the air is dry, use a cool mist vaporizer or humidifier in your home.  If your cough is worse at night, try using extra pillows to raise your head up higher while you sleep.  Rest as needed.  Keep all follow-up visits as told by your doctor. This is important. Contact a doctor if:  You have new symptoms.  You cough  up pus.  Your cough does not get better after 2-3 weeks, or your cough gets worse.  Cough medicine does not help your cough and you are not sleeping well.  You have pain that gets worse or pain that is not helped with medicine.  You have a fever.  You are losing weight and you do not know why.  You have night sweats. Get help right away if:  You cough up blood.  You have trouble breathing.  Your heartbeat is very fast. These symptoms may be an emergency. Do not wait to see if the symptoms will go away. Get medical help right away. Call your local emergency services (911 in the [place] Do not drive yourself to the hospital. Summary  A cough helps to clear your throat and lungs. Many things can cause a cough.  Take over-the-counter and prescription medicines only as told by your doctor.  Always cover your mouth when you cough.  Contact a doctor if you have new symptoms or you have a cough that does not get better or gets worse. This information is not intended to replace advice given to you by your health care provider. Make sure you discuss any questions you have with your health care provider. Document Revised: 06/15/2018 Document Reviewed: 06/15/2018 Elsevier Patient Education  [place].

## 2020-05-23 NOTE — Assessment & Plan Note (Signed)
Ongoing for years, suspect related to underlying allergic rhinitis.  No red flag symptoms.  Spirometry today FEV1 108% and FEV1/FVC 115%.   Some benefit present with OTC Allegra, not using Flonase, but not 100%.  Will trail Singulair at HS, script sent for 30 days supply.  Continue Pepcid for GERD.  Discussed with patient and provided education.  Avoid Benadryl products due to age >37, educated her on this.  Obtain CXR to further evaluate.  Medication review and no ACE present.  Will have return in 6 weeks, if ongoing consider allergist or pulmonary referral.

## 2020-05-23 NOTE — Progress Notes (Signed)
BP 115/60    Pulse 65    Temp 98.2 F (36.8 C) (Oral)    Ht 5' 1.18" (1.554 m)    Wt 224 lb 3.2 oz (101.7 kg)    SpO2 95%    BMI 42.11 kg/m    Subjective:    Patient ID: Annette Porter, female    DOB: 1951-02-04, 69 y.o.   MRN: [id number]  HPI: Annette Porter is a 69 y.o. female  Chief Complaint  Patient presents with   Cough    Allergy med seems to be working a little.   COUGH Follow-up for cough reported on 03/27/20, at the time recommended OTC allergy medication trial. Is taking Allegra 24HR, which she reports benefit with.  She does not recall having allergy testing in past.    Previous report from 03/27/2020: Has been ongoing for some time.  Like a tickle in throat.  Years ago had bronchitis and reports the mild cough has been present on/off since then.  Does endorse reflux issues in past and taking Pepcid consistently at night.  Has history of bad sinus infections in the fall -- recurrent in the fall.  Denies night sweats, fever, decreased appetite.  Does report she is frequently clearing throat.  She reports recently having changes in smell, states she smells sulfa frequently -- not everything smells like this and she can not peg a time of day it presents more.  No history of Covid or recent URI.  Does have history of radiation with breast CA.   Duration: months Circumstances of initial development of cough: unknown Cough severity: mild Cough description: non-productive Aggravating factors:  nothing Alleviating factors: nothing Status:  stable Treatments attempted: none Wheezing: no Shortness of breath: no Chest pain: no Chest tightness:no Nasal congestion: no Runny nose: no Postnasal drip: yes Frequent throat clearing or swallowing: no Hemoptysis: no Fevers: no Night sweats: no Weight loss: no Heartburn: yes Recent foreign travel: no Tuberculosis contacts: no  Relevant past medical, surgical, family and social history reviewed and updated as indicated.  Interim medical history since our last visit reviewed. Allergies and medications reviewed and updated.  Review of Systems  Constitutional: Negative for activity change, appetite change, diaphoresis, fatigue and fever.  Respiratory: Positive for cough. Negative for chest tightness, shortness of breath and wheezing.   Cardiovascular: Negative for chest pain, palpitations and leg swelling.  Gastrointestinal: Negative.   Neurological: Negative.   Psychiatric/Behavioral: Negative.     Per HPI unless specifically indicated above     Objective:    BP 115/60    Pulse 65    Temp 98.2 F (36.8 C) (Oral)    Ht 5' 1.18" (1.554 m)    Wt 224 lb 3.2 oz (101.7 kg)    SpO2 95%    BMI 42.11 kg/m   Wt Readings from Last 3 Encounters:  05/23/20 224 lb 3.2 oz (101.7 kg)  03/27/20 220 lb (99.8 kg)  03/24/20 215 lb (97.5 kg)    Physical Exam Vitals and nursing note reviewed.  Constitutional:      General: She is awake. She is not in acute distress.    Appearance: She is well-developed. She is morbidly obese. She is not ill-appearing.  HENT:     Head: Normocephalic.     Right Ear: Hearing normal.     Left Ear: Hearing normal.  Eyes:     General: Lids are normal.        Right eye: No discharge.  Left eye: No discharge.     Conjunctiva/sclera: Conjunctivae normal.     Pupils: Pupils are equal, round, and reactive to light.  Neck:     Thyroid: No thyromegaly.     Vascular: No carotid bruit.  Cardiovascular:     Rate and Rhythm: Normal rate and regular rhythm.     Heart sounds: Normal heart sounds. No murmur heard. No gallop.   Pulmonary:     Effort: Pulmonary effort is normal. No accessory muscle usage or respiratory distress.     Breath sounds: Normal breath sounds.  Abdominal:     General: Bowel sounds are normal.     Palpations: Abdomen is soft.  Musculoskeletal:     Cervical back: Normal range of motion and neck supple.     Right lower leg: No edema.     Left lower leg: No  edema.  Skin:    General: Skin is warm and dry.  Neurological:     Mental Status: She is alert and oriented to person, place, and time.  Psychiatric:        Attention and Perception: Attention normal.        Mood and Affect: Mood normal.        Speech: Speech normal.        Behavior: Behavior normal. Behavior is cooperative.    Results for orders placed or performed in visit on 19/62/22  Basic metabolic panel  Result Value Ref Range   Glucose 97 65 - 99 mg/dL   BUN 21 8 - 27 mg/dL   Creatinine, Ser 0.70 0.57 - 1.00 mg/dL   GFR calc non Af Amer 88 >59 mL/min/1.73   GFR calc Af Amer 102 >59 mL/min/1.73   BUN/Creatinine Ratio 30 (H) 12 - 28   Sodium 138 134 - 144 mmol/L   Potassium 4.3 3.5 - 5.2 mmol/L   Chloride 100 96 - 106 mmol/L   CO2 23 20 - 29 mmol/L   Calcium 9.2 8.7 - 10.3 mg/dL  Lipid Panel w/o Chol/HDL Ratio  Result Value Ref Range   Cholesterol, Total 144 100 - 199 mg/dL   Triglycerides 105 0 - 149 mg/dL   HDL 50 >39 mg/dL   VLDL Cholesterol Cal 19 5 - 40 mg/dL   LDL Chol Calc (NIH) 75 0 - 99 mg/dL  TSH  Result Value Ref Range   TSH 0.833 0.450 - 4.500 uIU/mL  CBC with Differential/Platelet  Result Value Ref Range   WBC 8.0 3.4 - 10.8 x10E3/uL   RBC 4.66 3.77 - 5.28 x10E6/uL   Hemoglobin 14.0 11.1 - 15.9 g/dL   Hematocrit 41.6 34.0 - 46.6 %   MCV 89 79 - 97 fL   MCH 30.0 26.6 - 33.0 pg   MCHC 33.7 31.5 - 35.7 g/dL   RDW 13.1 11.7 - 15.4 %   Platelets 253 150 - 450 x10E3/uL   Neutrophils 65 Not Estab. %   Lymphs 22 Not Estab. %   Monocytes 9 Not Estab. %   Eos 3 Not Estab. %   Basos 1 Not Estab. %   Neutrophils Absolute 5.2 1.4 - 7.0 x10E3/uL   Lymphocytes Absolute 1.8 0.7 - 3.1 x10E3/uL   Monocytes Absolute 0.7 0.1 - 0.9 x10E3/uL   EOS (ABSOLUTE) 0.2 0.0 - 0.4 x10E3/uL   Basophils Absolute 0.1 0.0 - 0.2 x10E3/uL   Immature Granulocytes 0 Not Estab. %   Immature Grans (Abs) 0.0 0.0 - 0.1 x10E3/uL  T4, free  Result Value Ref Range  Free T4 1.21 0.82  - 1.77 ng/dL      Assessment & Plan:   Problem List Items Addressed This Visit      Other   Cough - Primary    Ongoing for years, suspect related to underlying allergic rhinitis.  No red flag symptoms.  Spirometry today FEV1 108% and FEV1/FVC 115%.   Some benefit present with OTC Allegra, not using Flonase, but not 100%.  Will trail Singulair at HS, script sent for 30 days supply.  Continue Pepcid for GERD.  Discussed with patient and provided education.  Avoid Benadryl products due to age >35, educated her on this.  Obtain CXR to further evaluate.  Medication review and no ACE present.  Will have return in 6 weeks, if ongoing consider allergist or pulmonary referral.        Relevant Orders   Spirometry with graph (Completed)   DG Chest 2 View       Follow up plan: Return in about 6 weeks (around 07/04/2020) for Cough Follow-up.

## 2020-05-24 ENCOUNTER — Telehealth: Payer: Self-pay

## 2020-05-24 ENCOUNTER — Telehealth: Payer: Self-pay | Admitting: Nurse Practitioner

## 2020-05-24 DIAGNOSIS — E039 Hypothyroidism, unspecified: Secondary | ICD-10-CM

## 2020-05-24 NOTE — Telephone Encounter (Signed)
Patient called and says she was called about an ultrasound appointment and wanted to know what it was all about. Patient given results as noted by Marnee Guarneri, FNP on 05/24/20, patient verbalized understanding. Advised it was sent via [place].  [name], NP  05/23/2020 2:54 PM EST      Contacted via MyChart   Good afternoon Annette Porter, your imaging overall shows no acute lung issue. No pneumonia. It does show the aorta which was slightly enlarged, which was seen on your Medicare screening back in 2016, mildly enlarged abdominal aorta. It is time for your repeat screening of this, via ultrasound. This was recommended to repeat in 5 years. I am going to place this order and you should hear to schedule this. Any questions for me? Keep being awesome!! Thank you for allowing me to participate in your care. Kindest regards, Jolene

## 2020-05-24 NOTE — Telephone Encounter (Signed)
Medication Refill - Medication: Levothyroxine   Has the patient contacted their pharmacy? Yes.   (Agent: If no, request that the patient contact the pharmacy for the refill.) (Agent: If yes, when and what did the pharmacy advise?)  Preferred Pharmacy (with phone number or street name):  [facility], [address], [place], [place] [address]  Phone: [phone] Fax: [phone]  Hours: Not open 24 hours     Agent: Please be advised that RX refills may take up to 3 business days. We ask that you follow-up with your pharmacy.

## 2020-05-26 ENCOUNTER — Ambulatory Visit (INDEPENDENT_AMBULATORY_CARE_PROVIDER_SITE_OTHER): Payer: Medicare Other

## 2020-05-26 ENCOUNTER — Other Ambulatory Visit: Payer: Self-pay

## 2020-05-26 DIAGNOSIS — I77811 Abdominal aortic ectasia: Secondary | ICD-10-CM

## 2020-05-26 NOTE — Progress Notes (Signed)
Contacted via MyChart   Good afternoon Annette Porter, I have good news.  Your ultrasound has returned and shows no aneurysm or bulging of aorta this check.  Improved screening.  At this time we do not need to repeat imaging, you are doing fantastic!! Keep being awesome!!  Thank you for allowing me to participate in your care. Kindest regards, Aviel Davalos

## 2020-05-30 ENCOUNTER — Ambulatory Visit: Payer: Medicare Other

## 2020-05-31 NOTE — Telephone Encounter (Signed)
Pt called stating that the bottle of this medication says no refills. Pt states that she only has 10 days left. Please advise.

## 2020-05-31 NOTE — Telephone Encounter (Signed)
Patient has refills already

## 2020-06-15 ENCOUNTER — Telehealth: Payer: Self-pay | Admitting: Nurse Practitioner

## 2020-06-15 ENCOUNTER — Other Ambulatory Visit: Payer: Self-pay | Admitting: Nurse Practitioner

## 2020-06-15 MED ORDER — PREDNISONE 20 MG PO TABS: 40.0000 mg | ORAL_TABLET | Freq: Every day | ORAL | 0 refills | Status: DC

## 2020-06-15 NOTE — Telephone Encounter (Signed)
Called pt and advised of Jolene's message, pt verbalized understanding

## 2020-06-15 NOTE — Telephone Encounter (Signed)
Will send in 5 days Prednisone at this time and she is to alert [place] to Covid results please.  If positive recommend she be scheduled for virtual visit.

## 2020-06-15 NOTE — Telephone Encounter (Signed)
Called pt she is wondering since she is being seen for the cough last seen 12/14 for cough and f/u scheduled 1/27. Pt states that she loss her sense of smell so she went and got covid test done and now awaiting results. Pt is wanting to know if something can be sent in for her cough since she has been taking stuff otc for the last 5 days and nothing is helping. Please advise.

## 2020-06-15 NOTE — Telephone Encounter (Signed)
Patient is calling requesting a antibotic for a cough and phelm. Patient had COVID test today and will not know results until Monday. Patient declined virtual appt. Please advise [phone]- [phone]

## 2020-06-19 ENCOUNTER — Telehealth (INDEPENDENT_AMBULATORY_CARE_PROVIDER_SITE_OTHER): Payer: Medicare Other | Admitting: Family Medicine

## 2020-06-19 ENCOUNTER — Other Ambulatory Visit: Payer: Self-pay

## 2020-06-19 ENCOUNTER — Telehealth: Payer: Medicare Other | Admitting: Family Medicine

## 2020-06-19 ENCOUNTER — Encounter: Payer: Self-pay | Admitting: Family Medicine

## 2020-06-19 DIAGNOSIS — U071 COVID-19: Secondary | ICD-10-CM

## 2020-06-19 DIAGNOSIS — J0100 Acute maxillary sinusitis, unspecified: Secondary | ICD-10-CM

## 2020-06-19 MED ORDER — BENZONATATE 200 MG PO CAPS: 200.0000 mg | ORAL_CAPSULE | Freq: Two times a day (BID) | ORAL | 0 refills | Status: DC | PRN

## 2020-06-19 MED ORDER — AZITHROMYCIN 250 MG PO TABS: ORAL_TABLET | ORAL | 0 refills | Status: DC

## 2020-06-19 MED ORDER — PREDNISONE 10 MG PO TABS: ORAL_TABLET | ORAL | 0 refills | Status: DC

## 2020-06-19 MED ORDER — HYDROCOD POLST-CPM POLST ER 10-8 MG/5ML PO SUER: 5.0000 mL | Freq: Two times a day (BID) | ORAL | 0 refills | Status: DC | PRN

## 2020-06-19 NOTE — Telephone Encounter (Signed)
Pt states her covid test was positive.  Pt states she still has congestion and cough.  Pt states she is not getting rest at night due to her severe coughing  Please advise.

## 2020-06-19 NOTE — Telephone Encounter (Signed)
Pt scheduled for 06/19/2020 has bad cell phone reception send txt covid + cell phone [phone] willing to try if has issues ask [phone]

## 2020-06-19 NOTE — Progress Notes (Signed)
There were no vitals taken for this visit.   Subjective:    Patient ID: Annette Porter, female    DOB: 02-09-1951, 70 y.o.   MRN: [id number]  HPI: Annette Porter is a 70 y.o. female  Chief Complaint  Patient presents with  . Covid Positive    Pt states she started feeling bad a week ago. Had a covid test on Thursday, received positive result. States she is still coughing and has congestion   UPPER RESPIRATORY TRACT INFECTION Duration: about a week Worst symptom: cough and congestion Fever: no Cough: yes Shortness of breath: no Wheezing: no Chest pain: no Chest tightness: no Chest congestion: no Nasal congestion: yes Runny nose: no Post nasal drip: no Sneezing: no Sore throat: no Swollen glands: no Sinus pressure: no Headache: yes Face pain: no Toothache: no Ear pain: no  Ear pressure: no  Eyes red/itching:no Eye drainage/crusting: no  Vomiting: no Rash: no Fatigue: yes Sick contacts: yes Strep contacts: no  Context: better Recurrent sinusitis: no Relief with OTC cold/cough medications: no  Treatments attempted: cold/sinus   Relevant past medical, surgical, family and social history reviewed and updated as indicated. Interim medical history since our last visit reviewed. Allergies and medications reviewed and updated.  Review of Systems  Constitutional: Positive for fatigue. Negative for activity change, appetite change, chills, diaphoresis, fever and unexpected weight change.  HENT: Positive for congestion and rhinorrhea. Negative for dental problem, drooling, ear discharge, ear pain, facial swelling, hearing loss, mouth sores, nosebleeds, postnasal drip, sinus pressure, sinus pain, sneezing, sore throat, tinnitus, trouble swallowing and voice change.   Respiratory: Positive for cough. Negative for apnea, choking, chest tightness, shortness of breath, wheezing and stridor.   Cardiovascular: Negative.   Gastrointestinal: Negative.   Musculoskeletal:  Negative.   Psychiatric/Behavioral: Negative.     Per HPI unless specifically indicated above     Objective:    There were no vitals taken for this visit.  Wt Readings from Last 3 Encounters:  05/23/20 224 lb 3.2 oz (101.7 kg)  03/27/20 220 lb (99.8 kg)  03/24/20 215 lb (97.5 kg)    Physical Exam Vitals and nursing note reviewed.  Constitutional:      General: She is not in acute distress.    Appearance: Normal appearance. She is not ill-appearing, toxic-appearing or diaphoretic.  HENT:     Head: Normocephalic and atraumatic.     Right Ear: External ear normal.     Left Ear: External ear normal.     Nose: Nose normal.     Mouth/Throat:     Mouth: Mucous membranes are moist.     Pharynx: Oropharynx is clear.  Eyes:     General: No scleral icterus.       Right eye: No discharge.        Left eye: No discharge.     Extraocular Movements: Extraocular movements intact.     Conjunctiva/sclera: Conjunctivae normal.     Pupils: Pupils are equal, round, and reactive to light.  Cardiovascular:     Rate and Rhythm: Normal rate and regular rhythm.     Pulses: Normal pulses.     Heart sounds: Normal heart sounds. No murmur heard. No friction rub. No gallop.   Pulmonary:     Effort: Pulmonary effort is normal. No respiratory distress.     Breath sounds: Normal breath sounds. No stridor. No wheezing, rhonchi or rales.  Chest:     Chest wall: No tenderness.  Musculoskeletal:  General: Normal range of motion.     Cervical back: Normal range of motion and neck supple.  Skin:    General: Skin is warm and dry.     Capillary Refill: Capillary refill takes less than 2 seconds.     Coloration: Skin is not jaundiced or pale.     Findings: No bruising, erythema, lesion or rash.  Neurological:     General: No focal deficit present.     Mental Status: She is alert and oriented to person, place, and time. Mental status is at baseline.  Psychiatric:        Mood and Affect: Mood  normal.        Behavior: Behavior normal.        Thought Content: Thought content normal.        Judgment: Judgment normal.     Results for orders placed or performed in visit on 16/10/96  Basic metabolic panel  Result Value Ref Range   Glucose 97 65 - 99 mg/dL   BUN 21 8 - 27 mg/dL   Creatinine, Ser 0.70 0.57 - 1.00 mg/dL   GFR calc non Af Amer 88 >59 mL/min/1.73   GFR calc Af Amer 102 >59 mL/min/1.73   BUN/Creatinine Ratio 30 (H) 12 - 28   Sodium 138 134 - 144 mmol/L   Potassium 4.3 3.5 - 5.2 mmol/L   Chloride 100 96 - 106 mmol/L   CO2 23 20 - 29 mmol/L   Calcium 9.2 8.7 - 10.3 mg/dL  Lipid Panel w/o Chol/HDL Ratio  Result Value Ref Range   Cholesterol, Total 144 100 - 199 mg/dL   Triglycerides 105 0 - 149 mg/dL   HDL 50 >39 mg/dL   VLDL Cholesterol Cal 19 5 - 40 mg/dL   LDL Chol Calc (NIH) 75 0 - 99 mg/dL  TSH  Result Value Ref Range   TSH 0.833 0.450 - 4.500 uIU/mL  CBC with Differential/Platelet  Result Value Ref Range   WBC 8.0 3.4 - 10.8 x10E3/uL   RBC 4.66 3.77 - 5.28 x10E6/uL   Hemoglobin 14.0 11.1 - 15.9 g/dL   Hematocrit 41.6 34.0 - 46.6 %   MCV 89 79 - 97 fL   MCH 30.0 26.6 - 33.0 pg   MCHC 33.7 31.5 - 35.7 g/dL   RDW 13.1 11.7 - 15.4 %   Platelets 253 150 - 450 x10E3/uL   Neutrophils 65 Not Estab. %   Lymphs 22 Not Estab. %   Monocytes 9 Not Estab. %   Eos 3 Not Estab. %   Basos 1 Not Estab. %   Neutrophils Absolute 5.2 1.4 - 7.0 x10E3/uL   Lymphocytes Absolute 1.8 0.7 - 3.1 x10E3/uL   Monocytes Absolute 0.7 0.1 - 0.9 x10E3/uL   EOS (ABSOLUTE) 0.2 0.0 - 0.4 x10E3/uL   Basophils Absolute 0.1 0.0 - 0.2 x10E3/uL   Immature Granulocytes 0 Not Estab. %   Immature Grans (Abs) 0.0 0.0 - 0.1 x10E3/uL  T4, free  Result Value Ref Range   Free T4 1.21 0.82 - 1.77 ng/dL      Assessment & Plan:   Problem List Items Addressed This Visit   None   Visit Diagnoses    COVID-19    -  Primary   Self-quarantine until 10 days after symptoms. Will treat  symptomatically with prednisone, tessalon, and tussionex (has taken before w/o rxn). Call with concerns   Relevant Medications   azithromycin (ZITHROMAX) 250 MG tablet   Acute non-recurrent maxillary sinusitis  Will treat with azithromycin and prednisone. Call if not getting better or getting worse.    Relevant Medications   predniSONE (DELTASONE) 10 MG tablet   benzonatate (TESSALON) 200 MG capsule   azithromycin (ZITHROMAX) 250 MG tablet   chlorpheniramine-HYDROcodone (TUSSIONEX PENNKINETIC ER) 10-8 MG/5ML SUER       Follow up plan: Return if symptoms worsen or fail to improve.   . This visit was completed via telephone due to the restrictions of the COVID-19 pandemic. All issues as above were discussed and addressed but no physical exam was performed. If it was felt that the patient should be evaluated in the office, they were directed there. The patient verbally consented to this visit. Patient was unable to complete an audio/visual visit due to Lack of equipment. Due to the catastrophic nature of the COVID-19 pandemic, this visit was done through audio contact only. . Location of the patient: home . Location of the provider: home . Those involved with this call:  . Provider: Park Liter, DO . CMA: Yvonna Alanis, [place] . Front Desk/Registration: Jill Side  . Time spent on call: 21 minutes with patient face to face via video conference. More than 50% of this time was spent in counseling and coordination of care. 30 minutes total spent in review of patient's record and preparation of their chart.

## 2020-06-21 ENCOUNTER — Telehealth: Payer: Medicare Other | Admitting: Family Medicine

## 2020-07-06 ENCOUNTER — Encounter: Payer: Self-pay | Admitting: Nurse Practitioner

## 2020-07-06 ENCOUNTER — Ambulatory Visit (INDEPENDENT_AMBULATORY_CARE_PROVIDER_SITE_OTHER): Payer: Medicare Other | Admitting: Nurse Practitioner

## 2020-07-06 ENCOUNTER — Other Ambulatory Visit: Payer: Self-pay

## 2020-07-06 DIAGNOSIS — I77811 Abdominal aortic ectasia: Secondary | ICD-10-CM

## 2020-07-06 DIAGNOSIS — Z8616 Personal history of COVID-19: Secondary | ICD-10-CM | POA: Diagnosis not present

## 2020-07-06 MED ORDER — MONTELUKAST SODIUM 10 MG PO TABS: 10.0000 mg | ORAL_TABLET | Freq: Every day | ORAL | 4 refills | Status: DC

## 2020-07-06 NOTE — Assessment & Plan Note (Signed)
BMI 43.31 with HTN, HLD, CAD.  Recommended eating smaller high protein, low fat meals more frequently and exercising 30 mins a day 5 times a week with a goal of 10-15lb weight loss in the next 3 months. Patient voiced their understanding and motivation to adhere to these recommendations.

## 2020-07-06 NOTE — Progress Notes (Signed)
BP 130/74 (BP Location: Left Arm)   Pulse 60   Temp 98.4 F (36.9 C) (Oral)   Ht 5' 1.1" (1.552 m)   Wt 230 lb (104.3 kg)   SpO2 98%   BMI 43.31 kg/m    Subjective:    Patient ID: Annette Porter, female    DOB: 24-Jun-1950, 70 y.o.   MRN: [id number]  HPI: Annette Porter is a 70 y.o. female  Chief Complaint  Patient presents with  . Cough    6 wk f/u on cough, patient states that cough is much better   COVID POSITIVE Tested positive for Covid on 06/15/20.  Cough is improved at this time, does have occasional SOB but not often and continued low energy -- although symptoms are getting better every day.  She is vaccinated x 2 Pfizer.  Has history of aortic ectasia noted on imaging 2016 -- had repeat which showed no ectatic changes or aneurysm in December 2021. Duration: days Circumstances of initial development of cough: URI -- Covid Cough severity: improved, now mild Alleviating factors: cough syrup Status:  better Treatments attempted: cough syrup and antibiotics Wheezing: no Shortness of breath: only a little, nothing she has been concerned about Chest pain: no Chest tightness:no Nasal congestion: no Runny nose: no Postnasal drip: no Frequent throat clearing or swallowing: no Hemoptysis: no Fevers: no Night sweats: no Weight loss: no Heartburn: no Recent foreign travel: no Tuberculosis contacts: no  Relevant past medical, surgical, family and social history reviewed and updated as indicated. Interim medical history since our last visit reviewed. Allergies and medications reviewed and updated.  Review of Systems  Constitutional: Negative for activity change, appetite change, diaphoresis, fatigue and fever.  Respiratory: Positive for shortness of breath (very mild, improved). Negative for cough (improved), chest tightness and wheezing.   Cardiovascular: Negative for chest pain, palpitations and leg swelling.  Gastrointestinal: Negative.   Neurological: Negative.    Psychiatric/Behavioral: Negative.     Per HPI unless specifically indicated above     Objective:    BP 130/74 (BP Location: Left Arm)   Pulse 60   Temp 98.4 F (36.9 C) (Oral)   Ht 5' 1.1" (1.552 m)   Wt 230 lb (104.3 kg)   SpO2 98%   BMI 43.31 kg/m   Wt Readings from Last 3 Encounters:  07/06/20 230 lb (104.3 kg)  05/23/20 224 lb 3.2 oz (101.7 kg)  03/27/20 220 lb (99.8 kg)    Physical Exam Vitals and nursing note reviewed.  Constitutional:      General: She is awake. She is not in acute distress.    Appearance: She is well-developed and well-groomed. She is morbidly obese. She is not ill-appearing.  HENT:     Head: Normocephalic.     Right Ear: Hearing normal.     Left Ear: Hearing normal.  Eyes:     General: Lids are normal.        Right eye: No discharge.        Left eye: No discharge.     Conjunctiva/sclera: Conjunctivae normal.     Pupils: Pupils are equal, round, and reactive to light.  Neck:     Thyroid: No thyromegaly.     Vascular: No carotid bruit.  Cardiovascular:     Rate and Rhythm: Normal rate and regular rhythm.     Heart sounds: Normal heart sounds. No murmur heard. No gallop.   Pulmonary:     Effort: Pulmonary effort is normal. No accessory  muscle usage or respiratory distress.     Breath sounds: Normal breath sounds.  Abdominal:     General: Bowel sounds are normal.     Palpations: Abdomen is soft.  Musculoskeletal:     Cervical back: Normal range of motion and neck supple.     Right lower leg: No edema.     Left lower leg: No edema.  Lymphadenopathy:     Cervical: No cervical adenopathy.  Skin:    General: Skin is warm and dry.  Neurological:     Mental Status: She is alert and oriented to person, place, and time.  Psychiatric:        Attention and Perception: Attention normal.        Mood and Affect: Mood normal.        Speech: Speech normal.        Behavior: Behavior normal. Behavior is cooperative.        Thought Content:  Thought content normal.     Results for orders placed or performed in visit on 37/62/83  Basic metabolic panel  Result Value Ref Range   Glucose 97 65 - 99 mg/dL   BUN 21 8 - 27 mg/dL   Creatinine, Ser 0.70 0.57 - 1.00 mg/dL   GFR calc non Af Amer 88 >59 mL/min/1.73   GFR calc Af Amer 102 >59 mL/min/1.73   BUN/Creatinine Ratio 30 (H) 12 - 28   Sodium 138 134 - 144 mmol/L   Potassium 4.3 3.5 - 5.2 mmol/L   Chloride 100 96 - 106 mmol/L   CO2 23 20 - 29 mmol/L   Calcium 9.2 8.7 - 10.3 mg/dL  Lipid Panel w/o Chol/HDL Ratio  Result Value Ref Range   Cholesterol, Total 144 100 - 199 mg/dL   Triglycerides 105 0 - 149 mg/dL   HDL 50 >39 mg/dL   VLDL Cholesterol Cal 19 5 - 40 mg/dL   LDL Chol Calc (NIH) 75 0 - 99 mg/dL  TSH  Result Value Ref Range   TSH 0.833 0.450 - 4.500 uIU/mL  CBC with Differential/Platelet  Result Value Ref Range   WBC 8.0 3.4 - 10.8 x10E3/uL   RBC 4.66 3.77 - 5.28 x10E6/uL   Hemoglobin 14.0 11.1 - 15.9 g/dL   Hematocrit 41.6 34.0 - 46.6 %   MCV 89 79 - 97 fL   MCH 30.0 26.6 - 33.0 pg   MCHC 33.7 31.5 - 35.7 g/dL   RDW 13.1 11.7 - 15.4 %   Platelets 253 150 - 450 x10E3/uL   Neutrophils 65 Not Estab. %   Lymphs 22 Not Estab. %   Monocytes 9 Not Estab. %   Eos 3 Not Estab. %   Basos 1 Not Estab. %   Neutrophils Absolute 5.2 1.4 - 7.0 x10E3/uL   Lymphocytes Absolute 1.8 0.7 - 3.1 x10E3/uL   Monocytes Absolute 0.7 0.1 - 0.9 x10E3/uL   EOS (ABSOLUTE) 0.2 0.0 - 0.4 x10E3/uL   Basophils Absolute 0.1 0.0 - 0.2 x10E3/uL   Immature Granulocytes 0 Not Estab. %   Immature Grans (Abs) 0.0 0.0 - 0.1 x10E3/uL  T4, free  Result Value Ref Range   Free T4 1.21 0.82 - 1.77 ng/dL      Assessment & Plan:   Problem List Items Addressed This Visit      Cardiovascular and Mediastinum   Abdominal aortic ectasia [facility])    Recent imaging December 2021 noted no ectatic changes or aneurysm, she is aware of results and no further  imaging at this time.        Other    Morbid obesity ([place]) - Primary    BMI 43.31 with HTN, HLD, CAD.  Recommended eating smaller high protein, low fat meals more frequently and exercising 30 mins a day 5 times a week with a goal of 10-15lb weight loss in the next 3 months. Patient voiced their understanding and motivation to adhere to these recommendations.       History of 2019 novel coronavirus disease (COVID-19)    Tested positive 06/15/20, at this time symptoms have improved with no further cough.  Recommend continuing supplements at home and rest.  Return to office for any return or worsening of symptoms.          Follow up plan: Return in about 2 months (around 09/20/2020) for Chronic disease visit -- meet new PCP.

## 2020-07-06 NOTE — Assessment & Plan Note (Signed)
Tested positive 06/15/20, at this time symptoms have improved with no further cough.  Recommend continuing supplements at home and rest.  Return to office for any return or worsening of symptoms.

## 2020-07-06 NOTE — Assessment & Plan Note (Signed)
Recent imaging December 2021 noted no ectatic changes or aneurysm, she is aware of results and no further imaging at this time.

## 2020-07-06 NOTE — Patient Instructions (Signed)

## 2020-07-26 DIAGNOSIS — E119 Type 2 diabetes mellitus without complications: Secondary | ICD-10-CM | POA: Diagnosis not present

## 2020-07-26 DIAGNOSIS — I10 Essential (primary) hypertension: Secondary | ICD-10-CM | POA: Diagnosis not present

## 2020-07-26 DIAGNOSIS — I480 Paroxysmal atrial fibrillation: Secondary | ICD-10-CM | POA: Diagnosis not present

## 2020-07-26 DIAGNOSIS — I77811 Abdominal aortic ectasia: Secondary | ICD-10-CM | POA: Diagnosis not present

## 2020-07-26 DIAGNOSIS — I252 Old myocardial infarction: Secondary | ICD-10-CM | POA: Diagnosis not present

## 2020-07-26 DIAGNOSIS — I25118 Atherosclerotic heart disease of native coronary artery with other forms of angina pectoris: Secondary | ICD-10-CM | POA: Diagnosis not present

## 2020-08-01 ENCOUNTER — Other Ambulatory Visit: Payer: Self-pay | Admitting: Nurse Practitioner

## 2020-08-01 DIAGNOSIS — E039 Hypothyroidism, unspecified: Secondary | ICD-10-CM

## 2020-08-01 NOTE — Telephone Encounter (Signed)
Requested Prescriptions  Pending Prescriptions Disp Refills  . levothyroxine (SYNTHROID) 112 MCG tablet [Pharmacy Med Name: Levothyroxine Sodium 112 MCG Oral Tablet] 90 tablet 2    Sig: TAKE 1 TABLET BY MOUTH  DAILY     Endocrinology:  Hypothyroid Agents Failed - 08/01/2020  5:15 AM      Failed - TSH needs to be rechecked within 3 months after an abnormal result. Refill until TSH is due.      Passed - TSH in normal range and within 360 days    TSH  Date Value Ref Range Status  03/27/2020 0.833 0.450 - 4.500 uIU/mL Final         Passed - Valid encounter within last 12 months    Recent Outpatient Visits          3 weeks ago Morbid obesity ([place])   [facility], [place] T, NP   1 month ago COVID-19   [employer], [place], DO   2 months ago Cough   [place], Henrine Screws T, NP   4 months ago Morbid obesity ([place])   [place], Jolene T, NP   11 months ago Morbid obesity ([place])   [place], Barbaraann Faster, NP      Future Appointments            In 1 month Vigg, [place], MD [facility], [place]   In 7 months  [employer], [place]

## 2020-08-16 ENCOUNTER — Other Ambulatory Visit: Payer: Self-pay | Admitting: General Surgery

## 2020-08-16 DIAGNOSIS — Z853 Personal history of malignant neoplasm of breast: Secondary | ICD-10-CM

## 2020-08-16 DIAGNOSIS — Z79811 Long term (current) use of aromatase inhibitors: Secondary | ICD-10-CM

## 2020-08-24 DIAGNOSIS — Z23 Encounter for immunization: Secondary | ICD-10-CM | POA: Diagnosis not present

## 2020-08-28 ENCOUNTER — Ambulatory Visit
Admission: RE | Admit: 2020-08-28 | Discharge: 2020-08-28 | Disposition: A | Discharge status: Home / Self Care | Payer: Medicare Other | Source: Ambulatory Visit | Attending: General Surgery | Admitting: General Surgery

## 2020-08-28 ENCOUNTER — Other Ambulatory Visit: Payer: Self-pay

## 2020-08-28 DIAGNOSIS — Z79811 Long term (current) use of aromatase inhibitors: Secondary | ICD-10-CM | POA: Insufficient documentation

## 2020-08-28 DIAGNOSIS — Z13820 Encounter for screening for osteoporosis: Secondary | ICD-10-CM | POA: Diagnosis not present

## 2020-08-28 DIAGNOSIS — Z780 Asymptomatic menopausal state: Secondary | ICD-10-CM | POA: Insufficient documentation

## 2020-08-28 DIAGNOSIS — E039 Hypothyroidism, unspecified: Secondary | ICD-10-CM | POA: Insufficient documentation

## 2020-09-04 DIAGNOSIS — C50312 Malignant neoplasm of lower-inner quadrant of left female breast: Secondary | ICD-10-CM | POA: Diagnosis not present

## 2020-09-04 DIAGNOSIS — Z170 Estrogen receptor positive status [ER+]: Secondary | ICD-10-CM | POA: Diagnosis not present

## 2020-09-05 ENCOUNTER — Encounter: Payer: Self-pay | Admitting: General Surgery

## 2020-09-19 ENCOUNTER — Ambulatory Visit (INDEPENDENT_AMBULATORY_CARE_PROVIDER_SITE_OTHER): Payer: Medicare Other | Admitting: Internal Medicine

## 2020-09-19 ENCOUNTER — Encounter: Payer: Self-pay | Admitting: Internal Medicine

## 2020-09-19 ENCOUNTER — Ambulatory Visit: Payer: Medicare Other | Admitting: Internal Medicine

## 2020-09-19 ENCOUNTER — Other Ambulatory Visit: Payer: Self-pay

## 2020-09-19 VITALS — BP 157/77 | HR 59 | Temp 98.3°F | Ht 61.0 in | Wt 224.6 lb

## 2020-09-19 DIAGNOSIS — I10 Essential (primary) hypertension: Secondary | ICD-10-CM

## 2020-09-19 NOTE — Progress Notes (Signed)
BP (!) 157/77 (BP Location: Right Arm, Cuff Size: Normal)   Pulse (!) 59   Temp 98.3 F (36.8 C) (Oral)   Ht 5\' 1"  (1.549 m)   Wt 224 lb 9.6 oz (101.9 kg)   SpO2 97%   BMI 42.44 kg/m    Subjective:    Patient ID: Annette Porter, female    DOB: 09/27/1950, 70 y.o.   MRN: [id number]  HPI: Annette Porter is a 70 y.o. female  Pt is here to establish care, has been trying since the end of jan - to loose weight - was on prednisone for sec to COVID in [place] an was on such till march.  Still has some cough , has a RV and is going camping to [employer] Left breast cancer S/P SURGERY 2014 and 2015 now cancer free per pt has had radiation x 2 no chemo CAD in 2000 no stents medical mx.  Hyperlipidemia This is a chronic problem. The problem is controlled. Pertinent negatives include no chest pain, myalgias or shortness of breath.  Hypertension This is a chronic (is on hctz and metoprolol ) problem. The current episode started more than 1 year ago. Pertinent negatives include no chest pain, headaches, palpitations or shortness of breath.  Coronary Artery Disease Presents for follow-up (stable, is on imdur sees dr. Ubaldo Glassing @ [facility]. ) visit. Pertinent negatives include no chest pain, chest tightness, dizziness, leg swelling, palpitations or shortness of breath. Risk factors include hyperlipidemia and hypertension.  Gastroesophageal Reflux She complains of coughing. She reports no abdominal pain, no belching, no chest pain, no choking, no dysphagia, no early satiety, no globus sensation, no heartburn, no nausea or no wheezing. This is a chronic problem. Pertinent negatives include no fatigue.    Chief Complaint  Patient presents with  . Hyperlipidemia  . Hypertension  . Coronary Artery Disease  . Gastroesophageal Reflux    Relevant past medical, surgical, family and social history reviewed and updated as indicated. Interim medical history since our last visit reviewed. Allergies  and medications reviewed and updated.  Review of Systems  Constitutional: Negative for activity change, appetite change, chills, fatigue and fever.  HENT: Negative for congestion, ear discharge, ear pain and facial swelling.   Eyes: Negative for pain and itching.  Respiratory: Positive for cough. Negative for choking, chest tightness, shortness of breath and wheezing.   Cardiovascular: Negative for chest pain, palpitations and leg swelling.  Gastrointestinal: Negative for abdominal distention, abdominal pain, blood in stool, constipation, diarrhea, dysphagia, heartburn, nausea and vomiting.  Endocrine: Negative for cold intolerance, heat intolerance, polydipsia, polyphagia and polyuria.  Genitourinary: Negative for difficulty urinating, dysuria, flank pain, frequency, hematuria and urgency.  Musculoskeletal: Negative for arthralgias, gait problem, joint swelling and myalgias.  Skin: Negative for color change, rash and wound.  Neurological: Negative for dizziness, tremors, speech difficulty, weakness, light-headedness, numbness and headaches.  Hematological: Does not bruise/bleed easily.  Psychiatric/Behavioral: Negative for agitation, confusion, decreased concentration, sleep disturbance and suicidal ideas.    Per HPI unless specifically indicated above     Objective:    BP (!) 157/77 (BP Location: Right Arm, Cuff Size: Normal)   Pulse (!) 59   Temp 98.3 F (36.8 C) (Oral)   Ht 5\' 1"  (1.549 m)   Wt 224 lb 9.6 oz (101.9 kg)   SpO2 97%   BMI 42.44 kg/m   Wt Readings from Last 3 Encounters:  09/19/20 224 lb 9.6 oz (101.9 kg)  07/06/20 230 lb (104.3  kg)  05/23/20 224 lb 3.2 oz (101.7 kg)    Physical Exam Vitals and nursing note reviewed.  Constitutional:      General: She is not in acute distress.    Appearance: Normal appearance. She is not ill-appearing or diaphoretic.  HENT:     Head: Normocephalic and atraumatic.     Right Ear: Tympanic membrane and external ear normal.  There is no impacted cerumen.     Left Ear: External ear normal.     Nose: No congestion or rhinorrhea.     Mouth/Throat:     Pharynx: No oropharyngeal exudate or posterior oropharyngeal erythema.  Eyes:     Conjunctiva/sclera: Conjunctivae normal.     Pupils: Pupils are equal, round, and reactive to light.  Cardiovascular:     Rate and Rhythm: Normal rate and regular rhythm.     Heart sounds: No murmur heard. No friction rub. No gallop.   Pulmonary:     Effort: No respiratory distress.     Breath sounds: No stridor. No wheezing or rhonchi.  Chest:     Chest wall: No tenderness.  Abdominal:     General: Abdomen is flat. Bowel sounds are normal. There is no distension.     Palpations: Abdomen is soft. There is no mass.     Tenderness: There is no abdominal tenderness. There is no guarding.  Musculoskeletal:        General: No swelling or deformity.     Cervical back: Normal range of motion and neck supple. No rigidity or tenderness.     Right lower leg: No edema.     Left lower leg: No edema.  Skin:    General: Skin is warm and dry.     Coloration: Skin is not jaundiced.     Findings: No erythema.  Neurological:     Mental Status: She is alert and oriented to person, place, and time. Mental status is at baseline.  Psychiatric:        Mood and Affect: Mood normal.        Behavior: Behavior normal.        Thought Content: Thought content normal.        Judgment: Judgment normal.     Results for orders placed or performed in visit on 67/59/16  Basic metabolic panel  Result Value Ref Range   Glucose 97 65 - 99 mg/dL   BUN 21 8 - 27 mg/dL   Creatinine, Ser 0.70 0.57 - 1.00 mg/dL   GFR calc non Af Amer 88 >59 mL/min/1.73   GFR calc Af Amer 102 >59 mL/min/1.73   BUN/Creatinine Ratio 30 (H) 12 - 28   Sodium 138 134 - 144 mmol/L   Potassium 4.3 3.5 - 5.2 mmol/L   Chloride 100 96 - 106 mmol/L   CO2 23 20 - 29 mmol/L   Calcium 9.2 8.7 - 10.3 mg/dL  Lipid Panel w/o Chol/HDL  Ratio  Result Value Ref Range   Cholesterol, Total 144 100 - 199 mg/dL   Triglycerides 105 0 - 149 mg/dL   HDL 50 >39 mg/dL   VLDL Cholesterol Cal 19 5 - 40 mg/dL   LDL Chol Calc (NIH) 75 0 - 99 mg/dL  TSH  Result Value Ref Range   TSH 0.833 0.450 - 4.500 uIU/mL  CBC with Differential/Platelet  Result Value Ref Range   WBC 8.0 3.4 - 10.8 x10E3/uL   RBC 4.66 3.77 - 5.28 x10E6/uL   Hemoglobin 14.0 11.1 - 15.9 g/dL  Hematocrit 41.6 34.0 - 46.6 %   MCV 89 79 - 97 fL   MCH 30.0 26.6 - 33.0 pg   MCHC 33.7 31.5 - 35.7 g/dL   RDW 13.1 11.7 - 15.4 %   Platelets 253 150 - 450 x10E3/uL   Neutrophils 65 Not Estab. %   Lymphs 22 Not Estab. %   Monocytes 9 Not Estab. %   Eos 3 Not Estab. %   Basos 1 Not Estab. %   Neutrophils Absolute 5.2 1.4 - 7.0 x10E3/uL   Lymphocytes Absolute 1.8 0.7 - 3.1 x10E3/uL   Monocytes Absolute 0.7 0.1 - 0.9 x10E3/uL   EOS (ABSOLUTE) 0.2 0.0 - 0.4 x10E3/uL   Basophils Absolute 0.1 0.0 - 0.2 x10E3/uL   Immature Granulocytes 0 Not Estab. %   Immature Grans (Abs) 0.0 0.0 - 0.1 x10E3/uL  T4, free  Result Value Ref Range   Free T4 1.21 0.82 - 1.77 ng/dL        Current Outpatient Medications:  .  aspirin 81 MG tablet, Take 81 mg by mouth daily., Disp: , Rfl:  .  atorvastatin (LIPITOR) 40 MG tablet, Take 1 tablet (40 mg total) by mouth daily., Disp: 90 tablet, Rfl: 4 .  Calcium Carbonate (CALCIUM 600 PO), Take 1 tablet by mouth 2 (two) times daily., Disp: , Rfl:  .  Cholecalciferol (VITAMIN D3) 1000 UNITS CAPS, Take 1 capsule by mouth daily., Disp: , Rfl:  .  docusate sodium (COLACE) 100 MG capsule, Take 100 mg by mouth daily., Disp: , Rfl:  .  ezetimibe (ZETIA) 10 MG tablet, Take 1 tablet (10 mg total) by mouth daily., Disp: 90 tablet, Rfl: 4 .  famotidine (PEPCID) 40 MG tablet, Take 40 mg by mouth daily., Disp: , Rfl:  .  hydrochlorothiazide (HYDRODIURIL) 25 MG tablet, Take 1 tablet (25 mg total) by mouth daily., Disp: 90 tablet, Rfl: 4 .  ibuprofen (ADVIL)  800 MG tablet, TAKE 1 TABLET BY MOUTH 3  TIMES DAILY AS NEEDED, Disp: 270 tablet, Rfl: 3 .  isosorbide mononitrate (IMDUR) 30 MG 24 hr tablet, Take 30 mg by mouth daily., Disp: , Rfl:  .  isosorbide mononitrate (IMDUR) 60 MG 24 hr tablet, TAKE 1 TABLET BY MOUTH ONCE DAILY, Disp: , Rfl:  .  levothyroxine (SYNTHROID) 112 MCG tablet, TAKE 1 TABLET BY MOUTH  DAILY, Disp: 90 tablet, Rfl: 2 .  metoprolol succinate (TOPROL-XL) 50 MG 24 hr tablet, Take 1 tablet (50 mg total) by mouth daily., Disp: 90 tablet, Rfl: 4 .  montelukast (SINGULAIR) 10 MG tablet, Take 1 tablet (10 mg total) by mouth at bedtime., Disp: 90 tablet, Rfl: 4 .  nitroGLYCERIN (NITROSTAT) 0.4 MG SL tablet, Place 0.4 mg under the tongue every 5 (five) minutes as needed for chest pain., Disp: , Rfl:  .  vitamin B-12 (CYANOCOBALAMIN) 250 MCG tablet, Take 500 mcg by mouth daily. , Disp: , Rfl:     Narrative & Impression  CLINICAL DATA:  Medicare screening exam for abdominal aortic aneurysm.  EXAM: ABDOMINAL AORTA SCREENING ULTRASOUND  TECHNIQUE: Ultrasound examination of the abdominal aorta was performed as a screening evaluation for abdominal aortic aneurysm.  COMPARISON:  None.  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 No aneurysm identified . Ectatic abdominal aorta at risk for aneurysm development. Recommend followup by ultrasound in 5 years. This recommendation  follows ACR consensus guidelines: White Paper of the ACR Incidental Findings Committee II on Vascular Findings. J Am Coll Radiol 2013; 10:789-794.        Assessment & Plan:  1. HTn Need to keep logs encouraged such fu with me x 2 weeks  HTN :  Continue current meds.  Medication compliance emphasised. pt advised to keep Bp logs. Pt verbalised understanding of the same. Pt to have a low salt diet .  Exercise to reach a goal of at least 150 mins a week.  lifestyle modifications explained and pt understands importance of the above.   2. Cough / sp COVID :  Is on singulair , ? Cough variant asthma.  Check PFTs next visit if still persists   3. Fu with labs   4. Hypothyroidism  PLEASE TAKE YOUR THYROID MEDICATION FIRST THING IN THE MORNING WHILST FASTING.  NO MEDICATION/ FOOD FOR AN HOUR AFTER INGESTING THYROID PILLS.   5. CAD  : stable fu and mx per cards  Problem List Items Addressed This Visit   None      Follow up plan: No follow-ups on file.

## 2020-09-26 ENCOUNTER — Other Ambulatory Visit: Payer: Medicare Other

## 2020-09-26 ENCOUNTER — Telehealth: Payer: Self-pay

## 2020-09-26 ENCOUNTER — Other Ambulatory Visit: Payer: Self-pay

## 2020-09-26 DIAGNOSIS — I10 Essential (primary) hypertension: Secondary | ICD-10-CM

## 2020-09-26 LAB — URINALYSIS, ROUTINE W REFLEX MICROSCOPIC
Bilirubin, UA: NEGATIVE
Glucose, UA: NEGATIVE
Ketones, UA: NEGATIVE
Nitrite, UA: NEGATIVE
Protein,UA: NEGATIVE
RBC, UA: NEGATIVE
Specific Gravity, UA: 1.015 (ref 1.005–1.030)
Urobilinogen, Ur: 0.2 mg/dL (ref 0.2–1.0)
pH, UA: 6 (ref 5.0–7.5)

## 2020-09-26 LAB — MICROSCOPIC EXAMINATION

## 2020-09-26 NOTE — Telephone Encounter (Signed)
Patient called and informed, would like antibiotic sent in to [facility] in [facility]

## 2020-09-27 ENCOUNTER — Telehealth: Payer: Self-pay

## 2020-09-27 LAB — COMPREHENSIVE METABOLIC PANEL
ALT: 34 IU/L — ABNORMAL HIGH (ref 0–32)
AST: 30 IU/L (ref 0–40)
Albumin/Globulin Ratio: 1.4 (ref 1.2–2.2)
Albumin: 4.2 g/dL (ref 3.8–4.8)
Alkaline Phosphatase: 86 IU/L (ref 44–121)
BUN/Creatinine Ratio: 23 (ref 12–28)
BUN: 16 mg/dL (ref 8–27)
Bilirubin Total: 0.3 mg/dL (ref 0.0–1.2)
CO2: 21 mmol/L (ref 20–29)
Calcium: 9.7 mg/dL (ref 8.7–10.3)
Chloride: 96 mmol/L (ref 96–106)
Creatinine, Ser: 0.7 mg/dL (ref 0.57–1.00)
Globulin, Total: 2.9 g/dL (ref 1.5–4.5)
Glucose: 94 mg/dL (ref 65–99)
Potassium: 4.3 mmol/L (ref 3.5–5.2)
Sodium: 135 mmol/L (ref 134–144)
Total Protein: 7.1 g/dL (ref 6.0–8.5)
eGFR: 93 mL/min/{1.73_m2} (ref >59)

## 2020-09-27 LAB — CBC WITH DIFFERENTIAL/PLATELET
Basophils Absolute: 0.1 10*3/uL (ref 0.0–0.2)
Basos: 1 %
EOS (ABSOLUTE): 0.2 10*3/uL (ref 0.0–0.4)
Eos: 3 %
Hematocrit: 42.9 % (ref 34.0–46.6)
Hemoglobin: 14.4 g/dL (ref 11.1–15.9)
Immature Grans (Abs): 0 10*3/uL (ref 0.0–0.1)
Immature Granulocytes: 0 %
Lymphocytes Absolute: 2.1 10*3/uL (ref 0.7–3.1)
Lymphs: 25 %
MCH: 30.4 pg (ref 26.6–33.0)
MCHC: 33.6 g/dL (ref 31.5–35.7)
MCV: 91 fL (ref 79–97)
Monocytes Absolute: 1 10*3/uL — ABNORMAL HIGH (ref 0.1–0.9)
Monocytes: 11 %
Neutrophils Absolute: 5.3 10*3/uL (ref 1.4–7.0)
Neutrophils: 60 %
Platelets: 275 10*3/uL (ref 150–450)
RBC: 4.73 x10E6/uL (ref 3.77–5.28)
RDW: 12.9 % (ref 11.7–15.4)
WBC: 8.7 10*3/uL (ref 3.4–10.8)

## 2020-09-27 LAB — LIPID PANEL
Chol/HDL Ratio: 3 ratio (ref 0.0–4.4)
Cholesterol, Total: 140 mg/dL (ref 100–199)
HDL: 47 mg/dL (ref >39)
LDL Chol Calc (NIH): 71 mg/dL (ref 0–99)
Triglycerides: 123 mg/dL (ref 0–149)
VLDL Cholesterol Cal: 22 mg/dL (ref 5–40)

## 2020-09-27 LAB — TSH: TSH: 1.25 u[IU]/mL (ref 0.450–4.500)

## 2020-09-27 NOTE — Telephone Encounter (Signed)
Spoke with patient and informed her at this time Dr.Vigg does not want to send over an antibiotic and encouraged the patient to increase her water intake. Also, advised patient to give our office a call back if she notices her symptoms worsening. Patient would also like to know what her other lab results were. Please advise?

## 2020-09-27 NOTE — Telephone Encounter (Signed)
See other phone encounter.  

## 2020-09-27 NOTE — Telephone Encounter (Signed)
Pt called and states she was suppose to receive an antibiotic for a UTI/ pt stated pharmacy has not received this medication/ please send to [facility] in [place] / Pt stated she'd like to pick this up today / please advise

## 2020-09-27 NOTE — Telephone Encounter (Signed)
Left message for patient to give our office a call back to discuss Dr.Viggs recommendations.

## 2020-09-27 NOTE — Telephone Encounter (Signed)
Copied from [facility] [id number]. Topic: General - Other >> Sep 27, 2020 11:57 AM Tessa Lerner A wrote: Reason for CRM: Patient has returned missed call from staff member D. Danelle Earthly  Patient would like to be contacted again when possible

## 2020-10-02 ENCOUNTER — Encounter: Payer: Self-pay | Admitting: Internal Medicine

## 2020-10-02 ENCOUNTER — Other Ambulatory Visit: Payer: Self-pay

## 2020-10-02 ENCOUNTER — Ambulatory Visit (INDEPENDENT_AMBULATORY_CARE_PROVIDER_SITE_OTHER): Payer: Medicare Other | Admitting: Internal Medicine

## 2020-10-02 DIAGNOSIS — I10 Essential (primary) hypertension: Secondary | ICD-10-CM

## 2020-10-02 NOTE — Progress Notes (Signed)
BP 123/72   Pulse 64   Temp 98.5 F (36.9 C) (Oral)   Ht 5' 0.98" (1.549 m)   Wt 223 lb 12.8 oz (101.5 kg)   SpO2 96%   BMI 42.31 kg/m    Subjective:    Patient ID: Annette Porter, female    DOB: 01/19/1951, 70 y.o.   MRN: [id number]  HPI: Annette Porter is a 70 y.o. female  pts bp is high per home logs .  Takes her meds at 8 am takes hctz and metoprolol   Hypertension This is a chronic problem. The current episode started more than 1 year ago (hctz and metoprolol ). Pertinent negatives include no chest pain or palpitations.  Hyperlipidemia This is a chronic problem. The problem is controlled. Pertinent negatives include no chest pain.  Gastroesophageal Reflux She reports no abdominal pain, no chest pain, no early satiety, no globus sensation or no sore throat. Pertinent negatives include no muscle weakness.  Coronary Artery Disease Presents for follow-up visit. Pertinent negatives include no chest pain, chest pressure, muscle weakness or palpitations. Risk factors include hyperlipidemia and hypertension.    Chief Complaint  Patient presents with  . Hypertension    Patient has log today of BP  . Hyperlipidemia  . Gastroesophageal Reflux  . Coronary Artery Disease    Relevant past medical, surgical, family and social history reviewed and updated as indicated. Interim medical history since our last visit reviewed. Allergies and medications reviewed and updated.  Review of Systems  HENT: Negative for sore throat.   Cardiovascular: Negative for chest pain and palpitations.  Gastrointestinal: Negative for abdominal pain.  Musculoskeletal: Negative for muscle weakness.    Per HPI unless specifically indicated above     Objective:    BP 123/72   Pulse 64   Temp 98.5 F (36.9 C) (Oral)   Ht 5' 0.98" (1.549 m)   Wt 223 lb 12.8 oz (101.5 kg)   SpO2 96%   BMI 42.31 kg/m   Wt Readings from Last 3 Encounters:  10/02/20 223 lb 12.8 oz (101.5 kg)  09/19/20 224 lb  9.6 oz (101.9 kg)  07/06/20 230 lb (104.3 kg)    Physical Exam Vitals and nursing note reviewed.  Constitutional:      General: She is not in acute distress.    Appearance: Normal appearance. She is not ill-appearing or diaphoretic.  HENT:     Head: Normocephalic and atraumatic.     Right Ear: Tympanic membrane and external ear normal. There is no impacted cerumen.     Left Ear: External ear normal.     Nose: No congestion or rhinorrhea.     Mouth/Throat:     Pharynx: No oropharyngeal exudate or posterior oropharyngeal erythema.  Eyes:     Conjunctiva/sclera: Conjunctivae normal.     Pupils: Pupils are equal, round, and reactive to light.  Cardiovascular:     Rate and Rhythm: Normal rate and regular rhythm.     Heart sounds: No murmur heard. No friction rub. No gallop.   Pulmonary:     Effort: No respiratory distress.     Breath sounds: No stridor. No wheezing or rhonchi.  Chest:     Chest wall: No tenderness.  Abdominal:     General: Abdomen is flat. Bowel sounds are normal. There is no distension.     Palpations: Abdomen is soft. There is no mass.     Tenderness: There is no abdominal tenderness. There is no guarding.  Musculoskeletal:        General: No swelling or deformity.     Cervical back: Normal range of motion and neck supple. No rigidity or tenderness.     Right lower leg: No edema.     Left lower leg: No edema.  Skin:    General: Skin is warm and dry.     Coloration: Skin is not jaundiced.     Findings: No erythema.  Neurological:     Mental Status: She is alert and oriented to person, place, and time. Mental status is at baseline.  Psychiatric:        Mood and Affect: Mood normal.        Behavior: Behavior normal.        Thought Content: Thought content normal.        Judgment: Judgment normal.     Results for orders placed or performed in visit on 09/26/20  Microscopic Examination   Urine  Result Value Ref Range   WBC, UA 0-5 0 - 5 /hpf   RBC 0-2 0  - 2 /hpf   Epithelial Cells (non renal) 0-10 0 - 10 /hpf   Bacteria, UA Few (A) None seen/Few  TSH  Result Value Ref Range   TSH 1.250 0.450 - 4.500 uIU/mL  CBC with Differential/Platelet  Result Value Ref Range   WBC 8.7 3.4 - 10.8 x10E3/uL   RBC 4.73 3.77 - 5.28 x10E6/uL   Hemoglobin 14.4 11.1 - 15.9 g/dL   Hematocrit 42.9 34.0 - 46.6 %   MCV 91 79 - 97 fL   MCH 30.4 26.6 - 33.0 pg   MCHC 33.6 31.5 - 35.7 g/dL   RDW 12.9 11.7 - 15.4 %   Platelets 275 150 - 450 x10E3/uL   Neutrophils 60 Not Estab. %   Lymphs 25 Not Estab. %   Monocytes 11 Not Estab. %   Eos 3 Not Estab. %   Basos 1 Not Estab. %   Neutrophils Absolute 5.3 1.4 - 7.0 x10E3/uL   Lymphocytes Absolute 2.1 0.7 - 3.1 x10E3/uL   Monocytes Absolute 1.0 (H) 0.1 - 0.9 x10E3/uL   EOS (ABSOLUTE) 0.2 0.0 - 0.4 x10E3/uL   Basophils Absolute 0.1 0.0 - 0.2 x10E3/uL   Immature Granulocytes 0 Not Estab. %   Immature Grans (Abs) 0.0 0.0 - 0.1 x10E3/uL  Urinalysis, Routine w reflex microscopic  Result Value Ref Range   Specific Gravity, UA 1.015 1.005 - 1.030   pH, UA 6.0 5.0 - 7.5   Color, UA Yellow Yellow   Appearance Ur Clear Clear   Leukocytes,UA 1+ (A) Negative   Protein,UA Negative Negative/Trace   Glucose, UA Negative Negative   Ketones, UA Negative Negative   RBC, UA Negative Negative   Bilirubin, UA Negative Negative   Urobilinogen, Ur 0.2 0.2 - 1.0 mg/dL   Nitrite, UA Negative Negative   Microscopic Examination See below:   Comprehensive metabolic panel  Result Value Ref Range   Glucose 94 65 - 99 mg/dL   BUN 16 8 - 27 mg/dL   Creatinine, Ser 0.70 0.57 - 1.00 mg/dL   eGFR 93 >59 mL/min/1.73   BUN/Creatinine Ratio 23 12 - 28   Sodium 135 134 - 144 mmol/L   Potassium 4.3 3.5 - 5.2 mmol/L   Chloride 96 96 - 106 mmol/L   CO2 21 20 - 29 mmol/L   Calcium 9.7 8.7 - 10.3 mg/dL   Total Protein 7.1 6.0 - 8.5 g/dL   Albumin 4.2 3.8 -  4.8 g/dL   Globulin, Total 2.9 1.5 - 4.5 g/dL   Albumin/Globulin Ratio 1.4 1.2 -  2.2   Bilirubin Total 0.3 0.0 - 1.2 mg/dL   Alkaline Phosphatase 86 44 - 121 IU/L   AST 30 0 - 40 IU/L   ALT 34 (H) 0 - 32 IU/L  Lipid panel  Result Value Ref Range   Cholesterol, Total 140 100 - 199 mg/dL   Triglycerides 123 0 - 149 mg/dL   HDL 47 >39 mg/dL   VLDL Cholesterol Cal 22 5 - 40 mg/dL   LDL Chol Calc (NIH) 71 0 - 99 mg/dL   Chol/HDL Ratio 3.0 0.0 - 4.4 ratio        Current Outpatient Medications:  .  aspirin 81 MG tablet, Take 81 mg by mouth daily., Disp: , Rfl:  .  atorvastatin (LIPITOR) 40 MG tablet, Take 1 tablet (40 mg total) by mouth daily., Disp: 90 tablet, Rfl: 4 .  Calcium Carbonate (CALCIUM 600 PO), Take 1 tablet by mouth 2 (two) times daily., Disp: , Rfl:  .  Cholecalciferol (VITAMIN D3) 1000 UNITS CAPS, Take 1 capsule by mouth daily., Disp: , Rfl:  .  docusate sodium (COLACE) 100 MG capsule, Take 100 mg by mouth daily., Disp: , Rfl:  .  ezetimibe (ZETIA) 10 MG tablet, Take 1 tablet (10 mg total) by mouth daily., Disp: 90 tablet, Rfl: 4 .  famotidine (PEPCID) 40 MG tablet, Take 40 mg by mouth daily., Disp: , Rfl:  .  hydrochlorothiazide (HYDRODIURIL) 25 MG tablet, Take 1 tablet (25 mg total) by mouth daily., Disp: 90 tablet, Rfl: 4 .  ibuprofen (ADVIL) 800 MG tablet, TAKE 1 TABLET BY MOUTH 3  TIMES DAILY AS NEEDED, Disp: 270 tablet, Rfl: 3 .  isosorbide mononitrate (IMDUR) 30 MG 24 hr tablet, Take 30 mg by mouth daily., Disp: , Rfl:  .  isosorbide mononitrate (IMDUR) 60 MG 24 hr tablet, TAKE 1 TABLET BY MOUTH ONCE DAILY, Disp: , Rfl:  .  levothyroxine (SYNTHROID) 112 MCG tablet, TAKE 1 TABLET BY MOUTH  DAILY, Disp: 90 tablet, Rfl: 2 .  metoprolol succinate (TOPROL-XL) 50 MG 24 hr tablet, Take 1 tablet (50 mg total) by mouth daily., Disp: 90 tablet, Rfl: 4 .  montelukast (SINGULAIR) 10 MG tablet, Take 1 tablet (10 mg total) by mouth at bedtime., Disp: 90 tablet, Rfl: 4 .  nitroGLYCERIN (NITROSTAT) 0.4 MG SL tablet, Place 0.4 mg under the tongue every 5 (five)  minutes as needed for chest pain., Disp: , Rfl:  .  vitamin B-12 (CYANOCOBALAMIN) 250 MCG tablet, Take 500 mcg by mouth daily. , Disp: , Rfl:     Assessment & Plan:  1. HTN  ": Increase HCTZ to 50 mg ie 2 of the 25 mg as pt has a 90 days supply , will send in 50 mg of hctz once pt calls to indicate she is out of her current meds.   Medication compliance emphasised. pt advised to keep Bp logs. Pt verbalised understanding of the same. Pt to have a low salt diet . Exercise to reach a goal of at least 150 mins a week.  lifestyle modifications explained and pt understands importance of the above.   2. HLD : is on zetia and lipitor for such  recheck FLP, check LFT's work on diet, SE of meds explained to pt. low fat and high fiber diet explained to pt.  3. GERD :  Stable, conitnue pepcid GERD continue pantoprazole 40 mg  q.day as prescribed patient advised to avoid laying down soon after his meals. He took a 2 hours between dinner and bedtime. Avoid spicy food and triggers that he knows food wise that worsen his acid reflux. Patient verbalized understanding of the above. Lifestyle modifications as above discussed with patient.   Problem List Items Addressed This Visit   None      Follow up plan: No follow-ups on file.

## 2020-11-13 ENCOUNTER — Other Ambulatory Visit: Payer: Self-pay

## 2020-11-13 ENCOUNTER — Ambulatory Visit: Payer: Medicare Other | Admitting: Internal Medicine

## 2020-11-13 ENCOUNTER — Other Ambulatory Visit: Payer: Medicare Other

## 2020-11-13 DIAGNOSIS — E782 Mixed hyperlipidemia: Secondary | ICD-10-CM

## 2020-11-13 DIAGNOSIS — I10 Essential (primary) hypertension: Secondary | ICD-10-CM | POA: Diagnosis not present

## 2020-11-14 LAB — CBC WITH DIFFERENTIAL/PLATELET
Basophils Absolute: 0.1 10*3/uL (ref 0.0–0.2)
Basos: 1 %
EOS (ABSOLUTE): 0.3 10*3/uL (ref 0.0–0.4)
Eos: 3 %
Hematocrit: 43.7 % (ref 34.0–46.6)
Hemoglobin: 14.1 g/dL (ref 11.1–15.9)
Immature Grans (Abs): 0 10*3/uL (ref 0.0–0.1)
Immature Granulocytes: 0 %
Lymphocytes Absolute: 1.5 10*3/uL (ref 0.7–3.1)
Lymphs: 19 %
MCH: 29.6 pg (ref 26.6–33.0)
MCHC: 32.3 g/dL (ref 31.5–35.7)
MCV: 92 fL (ref 79–97)
Monocytes Absolute: 0.7 10*3/uL (ref 0.1–0.9)
Monocytes: 9 %
Neutrophils Absolute: 5.4 10*3/uL (ref 1.4–7.0)
Neutrophils: 68 %
Platelets: 265 10*3/uL (ref 150–450)
RBC: 4.76 x10E6/uL (ref 3.77–5.28)
RDW: 12.5 % (ref 11.7–15.4)
WBC: 8.1 10*3/uL (ref 3.4–10.8)

## 2020-11-14 LAB — COMPREHENSIVE METABOLIC PANEL
ALT: 34 IU/L — ABNORMAL HIGH (ref 0–32)
AST: 26 IU/L (ref 0–40)
Albumin/Globulin Ratio: 1.6 (ref 1.2–2.2)
Albumin: 4.2 g/dL (ref 3.8–4.8)
Alkaline Phosphatase: 85 IU/L (ref 44–121)
BUN/Creatinine Ratio: 16 (ref 12–28)
BUN: 11 mg/dL (ref 8–27)
Bilirubin Total: 0.4 mg/dL (ref 0.0–1.2)
CO2: 24 mmol/L (ref 20–29)
Calcium: 9.5 mg/dL (ref 8.7–10.3)
Chloride: 96 mmol/L (ref 96–106)
Creatinine, Ser: 0.68 mg/dL (ref 0.57–1.00)
Globulin, Total: 2.6 g/dL (ref 1.5–4.5)
Glucose: 98 mg/dL (ref 65–99)
Potassium: 4.3 mmol/L (ref 3.5–5.2)
Sodium: 133 mmol/L — ABNORMAL LOW (ref 134–144)
Total Protein: 6.8 g/dL (ref 6.0–8.5)
eGFR: 94 mL/min/{1.73_m2} (ref >59)

## 2020-11-14 LAB — LIPID PANEL
Chol/HDL Ratio: 2.7 ratio (ref 0.0–4.4)
Cholesterol, Total: 131 mg/dL (ref 100–199)
HDL: 49 mg/dL (ref >39)
LDL Chol Calc (NIH): 62 mg/dL (ref 0–99)
Triglycerides: 111 mg/dL (ref 0–149)
VLDL Cholesterol Cal: 20 mg/dL (ref 5–40)

## 2020-11-17 DIAGNOSIS — L821 Other seborrheic keratosis: Secondary | ICD-10-CM | POA: Diagnosis not present

## 2020-11-17 DIAGNOSIS — L814 Other melanin hyperpigmentation: Secondary | ICD-10-CM | POA: Diagnosis not present

## 2020-11-20 ENCOUNTER — Other Ambulatory Visit: Payer: Self-pay

## 2020-11-20 ENCOUNTER — Ambulatory Visit (INDEPENDENT_AMBULATORY_CARE_PROVIDER_SITE_OTHER): Payer: Medicare Other | Admitting: Internal Medicine

## 2020-11-20 ENCOUNTER — Encounter: Payer: Self-pay | Admitting: Internal Medicine

## 2020-11-20 VITALS — BP 107/66 | HR 56 | Temp 98.6°F | Ht 60.98 in | Wt 222.8 lb

## 2020-11-20 DIAGNOSIS — E039 Hypothyroidism, unspecified: Secondary | ICD-10-CM

## 2020-11-20 MED ORDER — HYDROCHLOROTHIAZIDE 50 MG PO TABS: 50.0000 mg | ORAL_TABLET | Freq: Every day | ORAL | 1 refills | Status: DC

## 2020-11-20 NOTE — Progress Notes (Signed)
BP 107/66   Pulse (!) 56   Temp 98.6 F (37 C) (Oral)   Ht 5' 0.98" (1.549 m)   Wt 222 lb 12.8 oz (101.1 kg)   SpO2 96%   BMI 42.13 kg/m    Subjective:    Patient ID: Annette Porter, female    DOB: 1950-07-25, 70 y.o.   MRN: [id number]  Chief Complaint  Patient presents with   Hypertension   Coronary Artery Disease   Hyperlipidemia   Gastroesophageal Reflux   Hypothyroidism         HPI: Annette Porter is a 70 y.o. female  Hypertension This is a chronic problem. The current episode started more than 1 year ago. Pertinent negatives include no anxiety, chest pain, headaches, malaise/fatigue, palpitations or shortness of breath.  Coronary Artery Disease Presents for follow-up (stable is on metoprolol and imdur - sees cards for such every 6 months in aug next  is on asa 81 mg for such had a CAD x 2000 had a cath but no stents.) visit. Pertinent negatives include no chest pain, chest tightness, dizziness, leg swelling, palpitations or shortness of breath. Risk factors include hyperlipidemia and hypertension.  Hyperlipidemia Pertinent negatives include no chest pain, myalgias or shortness of breath.  Gastroesophageal Reflux She reports no abdominal pain, no chest pain, no coughing, no nausea or no wheezing. Pertinent negatives include no fatigue.   Chief Complaint  Patient presents with   Hypertension   Coronary Artery Disease   Hyperlipidemia   Gastroesophageal Reflux   Hypothyroidism         Relevant past medical, surgical, family and social history reviewed and updated as indicated. Interim medical history since our last visit reviewed. Allergies and medications reviewed and updated.  Review of Systems  Constitutional:  Negative for activity change, appetite change, chills, fatigue, fever and malaise/fatigue.  HENT:  Negative for congestion, ear discharge, ear pain and facial swelling.   Eyes:  Negative for pain and itching.  Respiratory:  Negative for cough,  chest tightness, shortness of breath and wheezing.   Cardiovascular:  Negative for chest pain, palpitations and leg swelling.  Gastrointestinal:  Negative for abdominal distention, abdominal pain, blood in stool, constipation, diarrhea, nausea and vomiting.  Endocrine: Negative for cold intolerance, heat intolerance, polydipsia, polyphagia and polyuria.  Genitourinary:  Negative for difficulty urinating, dysuria, flank pain, frequency, hematuria and urgency.  Musculoskeletal:  Negative for arthralgias, gait problem, joint swelling and myalgias.  Skin:  Negative for color change, rash and wound.  Neurological:  Negative for dizziness, tremors, speech difficulty, weakness, light-headedness, numbness and headaches.  Hematological:  Does not bruise/bleed easily.  Psychiatric/Behavioral:  Negative for agitation, confusion, decreased concentration, sleep disturbance and suicidal ideas.    Per HPI unless specifically indicated above     Objective:    BP 107/66   Pulse (!) 56   Temp 98.6 F (37 C) (Oral)   Ht 5' 0.98" (1.549 m)   Wt 222 lb 12.8 oz (101.1 kg)   SpO2 96%   BMI 42.13 kg/m   Wt Readings from Last 3 Encounters:  11/20/20 222 lb 12.8 oz (101.1 kg)  10/02/20 223 lb 12.8 oz (101.5 kg)  09/19/20 224 lb 9.6 oz (101.9 kg)    Physical Exam Vitals and nursing note reviewed.  Constitutional:      General: She is not in acute distress.    Appearance: Normal appearance. She is not ill-appearing or diaphoretic.  HENT:     Head: Normocephalic and  atraumatic.     Right Ear: Tympanic membrane and external ear normal. There is no impacted cerumen.     Left Ear: External ear normal.     Nose: No congestion or rhinorrhea.     Mouth/Throat:     Pharynx: No oropharyngeal exudate or posterior oropharyngeal erythema.  Eyes:     Conjunctiva/sclera: Conjunctivae normal.     Pupils: Pupils are equal, round, and reactive to light.  Cardiovascular:     Rate and Rhythm: Normal rate and regular  rhythm.     Heart sounds: Murmur heard.    No friction rub. No gallop.  Pulmonary:     Effort: No respiratory distress.     Breath sounds: No stridor. No wheezing or rhonchi.  Chest:     Chest wall: No tenderness.  Abdominal:     General: Abdomen is flat. Bowel sounds are normal. There is no distension.     Palpations: Abdomen is soft. There is no mass.     Tenderness: There is no abdominal tenderness. There is no guarding.  Musculoskeletal:        General: No swelling or deformity.     Cervical back: Normal range of motion and neck supple. No rigidity or tenderness.     Right lower leg: No edema.     Left lower leg: No edema.  Skin:    General: Skin is warm and dry.     Coloration: Skin is not jaundiced.     Findings: No erythema.  Neurological:     Mental Status: She is alert and oriented to person, place, and time. Mental status is at baseline.  Psychiatric:        Mood and Affect: Mood normal.        Behavior: Behavior normal.        Thought Content: Thought content normal.        Judgment: Judgment normal.    Results for orders placed or performed in visit on 11/13/20  Comprehensive metabolic panel  Result Value Ref Range   Glucose 98 65 - 99 mg/dL   BUN 11 8 - 27 mg/dL   Creatinine, Ser 0.68 0.57 - 1.00 mg/dL   eGFR 94 >59 mL/min/1.73   BUN/Creatinine Ratio 16 12 - 28   Sodium 133 (L) 134 - 144 mmol/L   Potassium 4.3 3.5 - 5.2 mmol/L   Chloride 96 96 - 106 mmol/L   CO2 24 20 - 29 mmol/L   Calcium 9.5 8.7 - 10.3 mg/dL   Total Protein 6.8 6.0 - 8.5 g/dL   Albumin 4.2 3.8 - 4.8 g/dL   Globulin, Total 2.6 1.5 - 4.5 g/dL   Albumin/Globulin Ratio 1.6 1.2 - 2.2   Bilirubin Total 0.4 0.0 - 1.2 mg/dL   Alkaline Phosphatase 85 44 - 121 IU/L   AST 26 0 - 40 IU/L   ALT 34 (H) 0 - 32 IU/L  CBC with Differential/Platelet  Result Value Ref Range   WBC 8.1 3.4 - 10.8 x10E3/uL   RBC 4.76 3.77 - 5.28 x10E6/uL   Hemoglobin 14.1 11.1 - 15.9 g/dL   Hematocrit 43.7 34.0 - 46.6  %   MCV 92 79 - 97 fL   MCH 29.6 26.6 - 33.0 pg   MCHC 32.3 31.5 - 35.7 g/dL   RDW 12.5 11.7 - 15.4 %   Platelets 265 150 - 450 x10E3/uL   Neutrophils 68 Not Estab. %   Lymphs 19 Not Estab. %   Monocytes 9 Not Estab. %  Eos 3 Not Estab. %   Basos 1 Not Estab. %   Neutrophils Absolute 5.4 1.4 - 7.0 x10E3/uL   Lymphocytes Absolute 1.5 0.7 - 3.1 x10E3/uL   Monocytes Absolute 0.7 0.1 - 0.9 x10E3/uL   EOS (ABSOLUTE) 0.3 0.0 - 0.4 x10E3/uL   Basophils Absolute 0.1 0.0 - 0.2 x10E3/uL   Immature Granulocytes 0 Not Estab. %   Immature Grans (Abs) 0.0 0.0 - 0.1 x10E3/uL  Lipid panel  Result Value Ref Range   Cholesterol, Total 131 100 - 199 mg/dL   Triglycerides 111 0 - 149 mg/dL   HDL 49 >39 mg/dL   VLDL Cholesterol Cal 20 5 - 40 mg/dL   LDL Chol Calc (NIH) 62 0 - 99 mg/dL   Chol/HDL Ratio 2.7 0.0 - 4.4 ratio        Current Outpatient Medications:    aspirin 81 MG tablet, Take 81 mg by mouth daily., Disp: , Rfl:    atorvastatin (LIPITOR) 40 MG tablet, Take 1 tablet (40 mg total) by mouth daily., Disp: 90 tablet, Rfl: 4   Calcium Carbonate (CALCIUM 600 PO), Take 1 tablet by mouth 2 (two) times daily., Disp: , Rfl:    Cholecalciferol (VITAMIN D3) 1000 UNITS CAPS, Take 1 capsule by mouth daily., Disp: , Rfl:    docusate sodium (COLACE) 100 MG capsule, Take 100 mg by mouth daily., Disp: , Rfl:    ezetimibe (ZETIA) 10 MG tablet, Take 1 tablet (10 mg total) by mouth daily., Disp: 90 tablet, Rfl: 4   famotidine (PEPCID) 40 MG tablet, Take 40 mg by mouth daily., Disp: , Rfl:    hydrochlorothiazide (HYDRODIURIL) 50 MG tablet, Take 50 mg by mouth daily., Disp: , Rfl:    ibuprofen (ADVIL) 800 MG tablet, TAKE 1 TABLET BY MOUTH 3  TIMES DAILY AS NEEDED, Disp: 270 tablet, Rfl: 3   isosorbide mononitrate (IMDUR) 30 MG 24 hr tablet, Take 30 mg by mouth daily., Disp: , Rfl:    isosorbide mononitrate (IMDUR) 60 MG 24 hr tablet, TAKE 1 TABLET BY MOUTH ONCE DAILY, Disp: , Rfl:    levothyroxine  (SYNTHROID) 112 MCG tablet, TAKE 1 TABLET BY MOUTH  DAILY, Disp: 90 tablet, Rfl: 2   metoprolol succinate (TOPROL-XL) 50 MG 24 hr tablet, Take 1 tablet (50 mg total) by mouth daily., Disp: 90 tablet, Rfl: 4   montelukast (SINGULAIR) 10 MG tablet, Take 1 tablet (10 mg total) by mouth at bedtime., Disp: 90 tablet, Rfl: 4   nitroGLYCERIN (NITROSTAT) 0.4 MG SL tablet, Place 0.4 mg under the tongue every 5 (five) minutes as needed for chest pain., Disp: , Rfl:    vitamin B-12 (CYANOCOBALAMIN) 250 MCG tablet, Take 500 mcg by mouth daily. , Disp: , Rfl:     Assessment & Plan:  HTN  Is on hctz and metoprolol for such  HTN :  Continue current meds.  Medication compliance emphasised. pt advised to keep Bp logs. Pt verbalised understanding of the same. Pt to have a low salt diet . Exercise to reach a goal of at least 150 mins a week.  lifestyle modifications explained and pt understands importance of the above.  2. Hypothryoidism ; Is on 112 mcg synthroid for such  HYPOTHYROIDISM PLEASE TAKE YOUR THYROID MEDICATION FIRST THING IN THE MORNING WHILST FASTING.  NO MEDICATION/ FOOD FOR AN HOUR AFTER INGESTING THYROID PILLS.  3. HLD is on lipitor and  zetia for such  recheck FLP, check LFT's work on diet, SE of meds explained  to pt. low fat and high fiber diet explained to pt.  4. CAD x 2000 no stents stbale on b blockers, Imdur / ASA  Fu and mx per cards To fu on Aortic US no results availble   Problem List Items Addressed This Visit   None    Follow up plan: No follow-ups on file.

## 2020-11-21 LAB — THYROID PANEL WITH TSH
Free Thyroxine Index: 3.2 (ref 1.2–4.9)
T3 Uptake Ratio: 32 % (ref 24–39)
T4, Total: 10 ug/dL (ref 4.5–12.0)
TSH: 0.729 u[IU]/mL (ref 0.450–4.500)

## 2021-02-02 DIAGNOSIS — I25118 Atherosclerotic heart disease of native coronary artery with other forms of angina pectoris: Secondary | ICD-10-CM | POA: Diagnosis not present

## 2021-02-02 DIAGNOSIS — I252 Old myocardial infarction: Secondary | ICD-10-CM | POA: Diagnosis not present

## 2021-02-02 DIAGNOSIS — E782 Mixed hyperlipidemia: Secondary | ICD-10-CM | POA: Diagnosis not present

## 2021-02-02 DIAGNOSIS — E119 Type 2 diabetes mellitus without complications: Secondary | ICD-10-CM | POA: Diagnosis not present

## 2021-02-02 DIAGNOSIS — I77811 Abdominal aortic ectasia: Secondary | ICD-10-CM | POA: Diagnosis not present

## 2021-02-02 DIAGNOSIS — I10 Essential (primary) hypertension: Secondary | ICD-10-CM | POA: Diagnosis not present

## 2021-02-21 ENCOUNTER — Other Ambulatory Visit: Payer: Self-pay | Admitting: Internal Medicine

## 2021-02-21 DIAGNOSIS — Z1231 Encounter for screening mammogram for malignant neoplasm of breast: Secondary | ICD-10-CM

## 2021-03-04 ENCOUNTER — Other Ambulatory Visit: Payer: Self-pay | Admitting: Nurse Practitioner

## 2021-03-04 DIAGNOSIS — I2583 Coronary atherosclerosis due to lipid rich plaque: Secondary | ICD-10-CM

## 2021-03-04 DIAGNOSIS — I2510 Atherosclerotic heart disease of native coronary artery without angina pectoris: Secondary | ICD-10-CM

## 2021-03-04 DIAGNOSIS — E785 Hyperlipidemia, unspecified: Secondary | ICD-10-CM

## 2021-03-04 DIAGNOSIS — I10 Essential (primary) hypertension: Secondary | ICD-10-CM

## 2021-03-04 NOTE — Telephone Encounter (Signed)
Next OV 03/27/21  Approved per protocol for 90 day supply.

## 2021-03-05 MED ORDER — ATORVASTATIN CALCIUM 40 MG PO TABS: 40.0000 mg | ORAL_TABLET | Freq: Every day | ORAL | 2 refills | Status: DC

## 2021-03-05 MED ORDER — EZETIMIBE 10 MG PO TABS: 10.0000 mg | ORAL_TABLET | Freq: Every day | ORAL | 2 refills | Status: DC

## 2021-03-05 MED ORDER — METOPROLOL SUCCINATE ER 50 MG PO TB24: 50.0000 mg | ORAL_TABLET | Freq: Every day | ORAL | 0 refills | Status: DC

## 2021-03-05 NOTE — Addendum Note (Signed)
Addended by: Matilde Sprang on: 03/05/2021 11:54 AM   Modules accepted: Orders

## 2021-03-05 NOTE — Addendum Note (Signed)
Addended by: Matilde Sprang on: 03/05/2021 11:50 AM   Modules accepted: Orders

## 2021-03-20 ENCOUNTER — Other Ambulatory Visit: Payer: Medicare Other

## 2021-03-20 ENCOUNTER — Other Ambulatory Visit: Payer: Self-pay

## 2021-03-20 DIAGNOSIS — E039 Hypothyroidism, unspecified: Secondary | ICD-10-CM

## 2021-03-21 LAB — CBC WITH DIFFERENTIAL/PLATELET
Basophils Absolute: 0.1 10*3/uL (ref 0.0–0.2)
Basos: 1 %
EOS (ABSOLUTE): 0.2 10*3/uL (ref 0.0–0.4)
Eos: 2 %
Hematocrit: 41.5 % (ref 34.0–46.6)
Hemoglobin: 13.7 g/dL (ref 11.1–15.9)
Immature Grans (Abs): 0 10*3/uL (ref 0.0–0.1)
Immature Granulocytes: 0 %
Lymphocytes Absolute: 1.6 10*3/uL (ref 0.7–3.1)
Lymphs: 22 %
MCH: 30.1 pg (ref 26.6–33.0)
MCHC: 33 g/dL (ref 31.5–35.7)
MCV: 91 fL (ref 79–97)
Monocytes Absolute: 0.8 10*3/uL (ref 0.1–0.9)
Monocytes: 12 %
Neutrophils Absolute: 4.5 10*3/uL (ref 1.4–7.0)
Neutrophils: 63 %
Platelets: 244 10*3/uL (ref 150–450)
RBC: 4.55 x10E6/uL (ref 3.77–5.28)
RDW: 12.9 % (ref 11.7–15.4)
WBC: 7.1 10*3/uL (ref 3.4–10.8)

## 2021-03-21 LAB — COMPREHENSIVE METABOLIC PANEL
ALT: 36 IU/L — ABNORMAL HIGH (ref 0–32)
AST: 32 IU/L (ref 0–40)
Albumin/Globulin Ratio: 1.7 (ref 1.2–2.2)
Albumin: 4.1 g/dL (ref 3.7–4.7)
Alkaline Phosphatase: 90 IU/L (ref 44–121)
BUN/Creatinine Ratio: 24 (ref 12–28)
BUN: 15 mg/dL (ref 8–27)
Bilirubin Total: 0.3 mg/dL (ref 0.0–1.2)
CO2: 24 mmol/L (ref 20–29)
Calcium: 9.4 mg/dL (ref 8.7–10.3)
Chloride: 95 mmol/L — ABNORMAL LOW (ref 96–106)
Creatinine, Ser: 0.63 mg/dL (ref 0.57–1.00)
Globulin, Total: 2.4 g/dL (ref 1.5–4.5)
Glucose: 95 mg/dL (ref 70–99)
Potassium: 4 mmol/L (ref 3.5–5.2)
Sodium: 133 mmol/L — ABNORMAL LOW (ref 134–144)
Total Protein: 6.5 g/dL (ref 6.0–8.5)
eGFR: 95 mL/min/{1.73_m2} (ref >59)

## 2021-03-21 LAB — TSH: TSH: 0.885 u[IU]/mL (ref 0.450–4.500)

## 2021-03-26 ENCOUNTER — Ambulatory Visit: Payer: Medicare Other

## 2021-03-27 ENCOUNTER — Ambulatory Visit
Admission: RE | Admit: 2021-03-27 | Discharge: 2021-03-27 | Disposition: A | Discharge status: Home / Self Care | Payer: Medicare Other | Attending: Internal Medicine | Admitting: Internal Medicine

## 2021-03-27 ENCOUNTER — Ambulatory Visit
Admission: RE | Admit: 2021-03-27 | Discharge: 2021-03-27 | Disposition: A | Discharge status: Home / Self Care | Payer: Medicare Other | Source: Ambulatory Visit | Attending: Internal Medicine | Admitting: Internal Medicine

## 2021-03-27 ENCOUNTER — Other Ambulatory Visit: Payer: Self-pay

## 2021-03-27 ENCOUNTER — Ambulatory Visit (INDEPENDENT_AMBULATORY_CARE_PROVIDER_SITE_OTHER): Payer: Medicare Other | Admitting: Internal Medicine

## 2021-03-27 ENCOUNTER — Encounter: Payer: Self-pay | Admitting: Internal Medicine

## 2021-03-27 VITALS — BP 113/58 | HR 61 | Temp 98.1°F | Ht 61.02 in | Wt 217.0 lb

## 2021-03-27 DIAGNOSIS — E039 Hypothyroidism, unspecified: Secondary | ICD-10-CM | POA: Diagnosis not present

## 2021-03-27 DIAGNOSIS — M545 Low back pain, unspecified: Secondary | ICD-10-CM

## 2021-03-27 DIAGNOSIS — M25572 Pain in left ankle and joints of left foot: Secondary | ICD-10-CM | POA: Diagnosis not present

## 2021-03-27 DIAGNOSIS — Z23 Encounter for immunization: Secondary | ICD-10-CM | POA: Diagnosis not present

## 2021-03-27 MED ORDER — HYDROCHLOROTHIAZIDE 25 MG PO TABS: 25.0000 mg | ORAL_TABLET | Freq: Every day | ORAL | 0 refills | Status: DC

## 2021-03-27 MED ORDER — LEVOTHYROXINE SODIUM 112 MCG PO TABS: 112.0000 ug | ORAL_TABLET | Freq: Every day | ORAL | 2 refills | Status: DC

## 2021-03-27 NOTE — Progress Notes (Signed)
Called pt and d/w her about abnl xrays of her L spine: she has Severe DJD : L2-L3 and I will refer her to ortho for further eval and management. She verbalized understanding of the above. Of note she has Aortic atherosclerosis : but is already on  zetia and lipitor

## 2021-03-27 NOTE — Progress Notes (Signed)
BP (!) 113/58   Pulse 61   Temp 98.1 F (36.7 C) (Oral)   Ht 5' 1.02" (1.55 m)   Wt 217 lb (98.4 kg)   SpO2 96%   BMI 40.97 kg/m    Subjective:    Patient ID: Annette Porter, female    DOB: [birth year], 70 y.o.   MRN: [id number]  Chief Complaint  Patient presents with   Hypertension   Hypothyroidism   Coronary Artery Disease    HPI: Annette Porter is a 70 y.o. female  Hypertension This is a chronic (bp low diastolic at 58 mm hg . is on 50 mg of hctz.) problem. Pertinent negatives include no palpitations. Identifiable causes of hypertension include a thyroid problem.  Coronary Artery Disease Pertinent negatives include no palpitations. Risk factors include hypertension.  Thyroid Problem Presents for follow-up visit. Patient reports no anxiety, cold intolerance, constipation, depressed mood, diaphoresis, diarrhea, hoarse voice, leg swelling, menstrual problem, nail problem, palpitations or tremors.   Chief Complaint  Patient presents with   Hypertension   Hypothyroidism   Coronary Artery Disease    Relevant past medical, surgical, family and social history reviewed and updated as indicated. Interim medical history since our last visit reviewed. Allergies and medications reviewed and updated.  Review of Systems  Constitutional:  Negative for diaphoresis.  HENT:  Negative for hoarse voice.   Cardiovascular:  Negative for palpitations.  Gastrointestinal:  Negative for constipation and diarrhea.  Endocrine: Negative for cold intolerance.  Genitourinary:  Negative for menstrual problem.  Neurological:  Negative for tremors.  Psychiatric/Behavioral:  The patient is not nervous/anxious.    Per HPI unless specifically indicated above     Objective:    BP (!) 113/58   Pulse 61   Temp 98.1 F (36.7 C) (Oral)   Ht 5' 1.02" (1.55 m)   Wt 217 lb (98.4 kg)   SpO2 96%   BMI 40.97 kg/m   Wt Readings from Last 3 Encounters:  03/27/21 217 lb (98.4 kg)  11/20/20 222  lb 12.8 oz (101.1 kg)  10/02/20 223 lb 12.8 oz (101.5 kg)    Physical Exam Vitals and nursing note reviewed.  Constitutional:      General: She is not in acute distress.    Appearance: Normal appearance. She is not ill-appearing or diaphoretic.  HENT:     Head: Normocephalic and atraumatic.     Right Ear: Tympanic membrane and external ear normal. There is no impacted cerumen.     Left Ear: External ear normal.     Nose: No congestion or rhinorrhea.     Mouth/Throat:     Pharynx: No oropharyngeal exudate or posterior oropharyngeal erythema.  Eyes:     Conjunctiva/sclera: Conjunctivae normal.     Pupils: Pupils are equal, round, and reactive to light.  Cardiovascular:     Rate and Rhythm: Normal rate and regular rhythm.     Heart sounds: No murmur heard.   No friction rub. No gallop.  Pulmonary:     Effort: No respiratory distress.     Breath sounds: No stridor. No wheezing or rhonchi.  Chest:     Chest wall: No tenderness.  Abdominal:     General: Abdomen is flat. Bowel sounds are normal. There is no distension.     Palpations: Abdomen is soft. There is no mass.     Tenderness: There is no abdominal tenderness. There is no guarding.  Musculoskeletal:        General: Tenderness  present. No swelling.     Cervical back: Normal range of motion and neck supple. No rigidity or tenderness.     Right lower leg: No edema.     Left lower leg: No edema.     Comments: Pararapinal ms area of the lower l spine.   Skin:    General: Skin is warm and dry.     Coloration: Skin is not jaundiced.     Findings: No erythema.  Neurological:     Mental Status: She is alert and oriented to person, place, and time. Mental status is at baseline.    Results for orders placed or performed in visit on 03/20/21  CBC with Differential/Platelet  Result Value Ref Range   WBC 7.1 3.4 - 10.8 x10E3/uL   RBC 4.55 3.77 - 5.28 x10E6/uL   Hemoglobin 13.7 11.1 - 15.9 g/dL   Hematocrit 41.5 34.0 - 46.6 %    MCV 91 79 - 97 fL   MCH 30.1 26.6 - 33.0 pg   MCHC 33.0 31.5 - 35.7 g/dL   RDW 12.9 11.7 - 15.4 %   Platelets 244 150 - 450 x10E3/uL   Neutrophils 63 Not Estab. %   Lymphs 22 Not Estab. %   Monocytes 12 Not Estab. %   Eos 2 Not Estab. %   Basos 1 Not Estab. %   Neutrophils Absolute 4.5 1.4 - 7.0 x10E3/uL   Lymphocytes Absolute 1.6 0.7 - 3.1 x10E3/uL   Monocytes Absolute 0.8 0.1 - 0.9 x10E3/uL   EOS (ABSOLUTE) 0.2 0.0 - 0.4 x10E3/uL   Basophils Absolute 0.1 0.0 - 0.2 x10E3/uL   Immature Granulocytes 0 Not Estab. %   Immature Grans (Abs) 0.0 0.0 - 0.1 x10E3/uL  Comprehensive metabolic panel  Result Value Ref Range   Glucose 95 70 - 99 mg/dL   BUN 15 8 - 27 mg/dL   Creatinine, Ser 0.63 0.57 - 1.00 mg/dL   eGFR 95 >59 mL/min/1.73   BUN/Creatinine Ratio 24 12 - 28   Sodium 133 (L) 134 - 144 mmol/L   Potassium 4.0 3.5 - 5.2 mmol/L   Chloride 95 (L) 96 - 106 mmol/L   CO2 24 20 - 29 mmol/L   Calcium 9.4 8.7 - 10.3 mg/dL   Total Protein 6.5 6.0 - 8.5 g/dL   Albumin 4.1 3.7 - 4.7 g/dL   Globulin, Total 2.4 1.5 - 4.5 g/dL   Albumin/Globulin Ratio 1.7 1.2 - 2.2   Bilirubin Total 0.3 0.0 - 1.2 mg/dL   Alkaline Phosphatase 90 44 - 121 IU/L   AST 32 0 - 40 IU/L   ALT 36 (H) 0 - 32 IU/L  TSH  Result Value Ref Range   TSH 0.885 0.450 - 4.500 uIU/mL        Current Outpatient Medications:    aspirin 81 MG tablet, Take 81 mg by mouth daily., Disp: , Rfl:    atorvastatin (LIPITOR) 40 MG tablet, Take 1 tablet (40 mg total) by mouth daily., Disp: 90 tablet, Rfl: 2   Calcium Carbonate (CALCIUM 600 PO), Take 1 tablet by mouth 2 (two) times daily., Disp: , Rfl:    Cholecalciferol (VITAMIN D3) 1000 UNITS CAPS, Take 1 capsule by mouth daily., Disp: , Rfl:    docusate sodium (COLACE) 100 MG capsule, Take 100 mg by mouth daily., Disp: , Rfl:    ezetimibe (ZETIA) 10 MG tablet, Take 1 tablet (10 mg total) by mouth daily., Disp: 90 tablet, Rfl: 2   famotidine (PEPCID) 40 MG  tablet, Take 40 mg by  mouth daily., Disp: , Rfl:    isosorbide mononitrate (IMDUR) 30 MG 24 hr tablet, Take 30 mg by mouth daily., Disp: , Rfl:    isosorbide mononitrate (IMDUR) 60 MG 24 hr tablet, TAKE 1 TABLET BY MOUTH ONCE DAILY, Disp: , Rfl:    metoprolol succinate (TOPROL-XL) 50 MG 24 hr tablet, Take 1 tablet (50 mg total) by mouth daily. Take with or immediately following a meal., Disp: 90 tablet, Rfl: 0   montelukast (SINGULAIR) 10 MG tablet, Take 1 tablet (10 mg total) by mouth at bedtime., Disp: 90 tablet, Rfl: 4   nitroGLYCERIN (NITROSTAT) 0.4 MG SL tablet, Place 0.4 mg under the tongue every 5 (five) minutes as needed for chest pain., Disp: , Rfl:    vitamin B-12 (CYANOCOBALAMIN) 250 MCG tablet, Take 500 mcg by mouth daily. , Disp: , Rfl:    hydrochlorothiazide (HYDRODIURIL) 25 MG tablet, Take 1 tablet (25 mg total) by mouth daily for 14 days., Disp: 14 tablet, Rfl: 0   levothyroxine (SYNTHROID) 112 MCG tablet, Take 1 tablet (112 mcg total) by mouth daily., Disp: 90 tablet, Rfl: 2    Assessment & Plan:  Hypothydoisim  TSH wnl,  Continue to take THYROID MEDICATION FIRST THING IN THE MORNING WHILST FASTING.  NO MEDICATION/ FOOD FOR AN HOUR AFTER INGESTING THYROID PILLS.  2. Back pain  Excercises advised,  Check X rays  Refer to podiatry for Ankle pain / leg discrepancy  3.  HLD stable on zetia and lipitor  recheck FLP, check LFT's work on diet, SE of meds explained to pt. low fat and high fiber diet explained to pt.  Problem List Items Addressed This Visit       Endocrine   Hypothyroidism   Relevant Medications   levothyroxine (SYNTHROID) 112 MCG tablet   Other Relevant Orders   CBC with Differential/Platelet   Comprehensive metabolic panel   Lipid Panel w/o Chol/HDL Ratio   TSH     Other   Need for influenza vaccination - Primary   Relevant Orders   Flu Vaccine QUAD High Dose(Fluad) (Completed)   Left ankle pain   Relevant Orders   Ambulatory referral to Podiatry   Lumbar spine pain    Relevant Orders   DG Lumbar Spine Complete (Completed)   CBC with Differential/Platelet   Comprehensive metabolic panel   Lipid Panel w/o Chol/HDL Ratio   TSH   Ambulatory referral to Orthopedics     Orders Placed This Encounter  Procedures   DG Lumbar Spine Complete   Flu Vaccine QUAD High Dose(Fluad)   CBC with Differential/Platelet   Comprehensive metabolic panel   Lipid Panel w/o Chol/HDL Ratio   TSH   Ambulatory referral to Podiatry   Ambulatory referral to Orthopedics     Meds ordered this encounter  Medications   levothyroxine (SYNTHROID) 112 MCG tablet    Sig: Take 1 tablet (112 mcg total) by mouth daily.    Dispense:  90 tablet    Refill:  2    Requesting 1 year supply   hydrochlorothiazide (HYDRODIURIL) 25 MG tablet    Sig: Take 1 tablet (25 mg total) by mouth daily for 14 days.    Dispense:  14 tablet    Refill:  0     Follow up plan: Return in about 3 months (around 06/27/2021).

## 2021-03-27 NOTE — Patient Instructions (Signed)

## 2021-03-30 ENCOUNTER — Ambulatory Visit (INDEPENDENT_AMBULATORY_CARE_PROVIDER_SITE_OTHER): Payer: Medicare Other

## 2021-03-30 VITALS — Ht 61.0 in | Wt 217.0 lb

## 2021-03-30 DIAGNOSIS — Z0000 Encounter for general adult medical examination without abnormal findings: Secondary | ICD-10-CM | POA: Diagnosis not present

## 2021-03-30 NOTE — Progress Notes (Signed)
I connected with Jeananne Rama today by telephone and verified that I am speaking with the correct person using two identifiers. Location patient: home Location provider: work Persons participating in the virtual visit: Sarin , provider.   I discussed the limitations, risks, security and privacy concerns of performing an evaluation and management service by telephone and the availability of in person appointments. I also discussed with the patient that there may be a patient responsible charge related to this service. The patient expressed understanding and verbally consented to this telephonic visit.    Interactive audio and video telecommunications were attempted between this provider and patient, however failed, due to patient having technical difficulties OR patient did not have access to video capability.  We continued and completed visit with audio only.     Vital signs may be patient reported or missing.  Subjective:   SALIA CANGEMI is a 70 y.o. female who presents for Medicare Annual (Subsequent) preventive examination.  Review of Systems     Cardiac Risk Factors include: advanced age (>57men, >20 women);obesity (BMI >30kg/m2);sedentary lifestyle     Objective:    Today's Vitals   03/30/21 0811 03/30/21 0812  Weight: 217 lb (98.4 kg)   Height: 5\' 1"  (1.549 m)   PainSc:  6    Body mass index is 41 kg/m.  Advanced Directives 03/30/2021 03/24/2020 02/17/2019 11/30/2018 01/26/2018 10/24/2017 09/22/2017  Does Patient Have a Medical Advance Directive? No No No No No No No  Would patient like information on creating a medical advance directive? - - - No - Patient declined Yes (MAU/Ambulatory/Procedural Areas - Information given) - No - Patient declined    Current Medications (verified) Outpatient Encounter Medications as of 03/30/2021  Medication Sig   aspirin 81 MG tablet Take 81 mg by mouth daily.   atorvastatin (LIPITOR) 40 MG tablet Take 1 tablet (40 mg total) by mouth  daily.   Calcium Carbonate (CALCIUM 600 PO) Take 1 tablet by mouth 2 (two) times daily.   Cholecalciferol (VITAMIN D3) 1000 UNITS CAPS Take 1 capsule by mouth daily.   docusate sodium (COLACE) 100 MG capsule Take 100 mg by mouth daily.   ezetimibe (ZETIA) 10 MG tablet Take 1 tablet (10 mg total) by mouth daily.   famotidine (PEPCID) 40 MG tablet Take 40 mg by mouth daily.   hydrochlorothiazide (HYDRODIURIL) 25 MG tablet Take 1 tablet (25 mg total) by mouth daily for 14 days.   isosorbide mononitrate (IMDUR) 30 MG 24 hr tablet Take 30 mg by mouth daily.   isosorbide mononitrate (IMDUR) 60 MG 24 hr tablet TAKE 1 TABLET BY MOUTH ONCE DAILY   levothyroxine (SYNTHROID) 112 MCG tablet Take 1 tablet (112 mcg total) by mouth daily.   metoprolol succinate (TOPROL-XL) 50 MG 24 hr tablet Take 1 tablet (50 mg total) by mouth daily. Take with or immediately following a meal.   montelukast (SINGULAIR) 10 MG tablet Take 1 tablet (10 mg total) by mouth at bedtime.   nitroGLYCERIN (NITROSTAT) 0.4 MG SL tablet Place 0.4 mg under the tongue every 5 (five) minutes as needed for chest pain.   vitamin B-12 (CYANOCOBALAMIN) 250 MCG tablet Take 500 mcg by mouth daily.    No facility-administered encounter medications on file as of 03/30/2021.    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   BREAST LUMPECTOMY Left 12/16/2013   IMC 11 mm ER/ PR positive, Her 2 neu negative, T1c, N0. Mammosite radiation.    BREAST LUMPECTOMY WITH MAMMOSITE CAVITY EVALUATION DEVICE Left 2014   BREAST LUMPECTOMY WITH MAMMOSITE CAVITY EVALUATION DEVICE Left 2015   BREAST MAMMOSITE  05/20/13   BREAST SURGERY Left 04/29/13   wide excision   BREAST SURGERY Left 12-16-13   Wide excision with SN biopsy, INVASIVE MAMMARY CARCINOMA   CARDIAC CATHETERIZATION     COLONOSCOPY  2006   COLONOSCOPY WITH PROPOFOL N/A 05/31/2015   Procedure: COLONOSCOPY WITH PROPOFOL;  Surgeon: Robert Bellow, MD;  Location: [facility] ENDOSCOPY;  Service: Endoscopy;  Laterality: N/A;   HAND SURGERY     KNEE ARTHROSCOPY WITH MEDIAL MENISECTOMY Left 02/24/2015   Procedure: KNEE ARTHROSCOPY WITH PARTIAL MEDIAL MENISECTOMY;  Surgeon: Christophe Louis, MD;  Location: [facility] ORS;  Service: Orthopedics;  Laterality: Left;   Family History  Problem Relation Age of Onset   Brain cancer Father    Thyroid disease Sister    Diabetes Maternal Uncle    Thyroid disease Paternal Aunt    Thyroid disease Sister    Thyroid disease Sister    Breast cancer Neg Hx    Social History   Socioeconomic History   Marital status: Married    Spouse name: Not on file   Number of children: Not on file   Years of education: Not on file   Highest education level: Some college, no degree  Occupational History   Occupation: retired  Tobacco Use   Smoking status: Never   Smokeless tobacco: Never  Vaping Use   Vaping Use: Never used  Substance and Sexual Activity   Alcohol use: No   Drug use: No   Sexual activity: Not Currently  Other Topics Concern   Not on file  Social History Narrative   Not on file   Social Determinants of Health   Financial Resource Strain: Low Risk    Difficulty of  Paying Living Expenses: Not hard at all  Food Insecurity: No Food Insecurity   Worried About Charity fundraiser in the Last Year: Never true   [place] in the Last Year: Never true  Transportation Needs: No Transportation Needs   Lack of Transportation (Medical): No   Lack of Transportation (Non-Medical): No  Physical Activity: Inactive   Days of Exercise per Week: 0 days   Minutes of Exercise per Session: 0 min  Stress: No Stress Concern Present   Feeling of Stress : Not at all  Social Connections: Not on file    Tobacco Counseling Counseling given: Not Answered   Clinical Intake:  Pre-visit preparation completed: Yes  Pain : 0-10 Pain Score: 6  Pain Type: Chronic pain Pain Location: Back Pain Orientation: Lower Pain Descriptors /  Indicators: Aching Pain Onset: More than a month ago Pain Frequency: Constant     Nutritional Status: BMI > 30  Obese Nutritional Risks: None Diabetes: No  How often do you need to have someone help you when you read instructions, pamphlets, or other written materials from your doctor or pharmacy?: 1 - Never What is the last grade level you completed in school?: 74yr college  Diabetic? no  Interpreter Needed?: No  Information entered by :: NAllen LPN   Activities of Daily Living In your present state of health, do you have any difficulty performing the following activities: 03/30/2021 03/27/2021  Hearing? N N  Vision? N Y  Difficulty concentrating or making decisions? N N  Walking or climbing stairs? Y N  Dressing or bathing? N N  Doing errands, shopping? N N  Preparing Food and eating ? N -  Using the Toilet? N -  In the past six months, have you accidently leaked urine? Y -  Comment with coughing or sneezing -  Do you have problems with loss of bowel control? N -  Managing your Medications? N -  Managing your Finances? N -  Housekeeping or managing your Housekeeping? N -  Some recent data might be hidden     Patient Care Team: Charlynne Cousins, MD as PCP - General (Internal Medicine) Bary Castilla, Forest Gleason, MD (General Surgery) Guadalupe Maple, MD (Family Medicine) Noreene Filbert, MD as Referring Physician (Radiation Oncology) Lollie Sails, MD (Inactive) as Consulting Physician (Gastroenterology) Teodoro Spray, MD as Consulting Physician (Cardiology)  Indicate any recent Medical Services you may have received from other than [facility] providers in the past year (date may be approximate).     Assessment:   This is a routine wellness examination for Leyani.  Hearing/Vision screen Vision Screening - Comments:: Regular eye exams, [facility]  Dietary issues and exercise activities discussed: Current Exercise Habits: The patient does not participate in regular exercise at present   Goals Addressed             This Visit's Progress    Patient Stated       03/30/2021, wants to lose 25 pounds       Depression Screen PHQ 2/9 Scores 03/30/2021 11/20/2020 10/02/2020 07/06/2020 03/24/2020 02/17/2019 01/26/2018  PHQ - 2 Score 0 0 0 0 0 0 0  PHQ- 9 Score - - - 0 - - -    Fall Risk Fall Risk  03/30/2021 03/27/2021 11/20/2020 10/02/2020 07/06/2020  Falls in the past year? 0 0 0 0 0  Number falls in past yr: - 0 0 - -  Injury with Fall? - 0 0 - -  Risk for fall due to : Medication side effect No Fall Risks No Fall Risks - -  Follow up Falls evaluation completed;Education provided;Falls prevention discussed Falls evaluation completed Falls evaluation completed - -    FALL RISK PREVENTION PERTAINING TO THE HOME:  Any stairs in or around the home? Yes  If so, are there any without handrails? No  Home free of loose throw rugs in walkways, pet beds, electrical cords, etc? Yes  Adequate lighting in your home to reduce risk of falls? Yes   ASSISTIVE DEVICES UTILIZED TO PREVENT FALLS:  Life alert? No  Use of a cane, walker or w/c? No  Grab bars in the bathroom? No  Shower chair or bench in  shower? Yes  Elevated toilet seat or a handicapped toilet? No   TIMED UP AND GO:  Was the test performed? No .      Cognitive Function:     6CIT Screen 03/30/2021 03/24/2020 01/26/2018 01/22/2017  What Year? 0 points 0 points 0 points 0 points  What month? 0 points 0 points 0 points 0 points  What time? 0 points 0 points 0 points 0 points  Count back from 20 0 points 0 points 0 points 0 points  Months in reverse 0 points 0 points 0 points 0 points  Repeat phrase 0 points 0 points 0 points 0 points  Total Score 0 0 0 0    Immunizations Immunization History  Administered Date(s) Administered   Fluad Quad(high Dose 65+) 02/17/2019, 03/27/2020, 03/27/2021   Influenza, High Dose Seasonal PF 03/19/2016, 03/17/2017, 03/26/2018   Influenza,inj,Quad PF,6+ Mos 03/22/2015   PFIZER(Purple Top)SARS-COV-2 Vaccination 08/16/2019, 09/07/2019, 08/24/2020   Pneumococcal Conjugate-13 12/21/2014   Pneumococcal-Unspecified 04/12/2004, 06/26/2009   Td 04/29/2005, 12/21/2014   Zoster Recombinat (Shingrix) 11/24/2019, 06/07/2020    TDAP status: Up to date  Flu Vaccine status: Up to date  Pneumococcal vaccine status: Due, Education has been provided regarding the importance of this vaccine. Advised may receive this vaccine at local pharmacy or Health Dept. Aware to provide a copy of the vaccination record if obtained from local pharmacy or Health Dept. Verbalized acceptance and understanding.  Covid-19 vaccine status: Completed vaccines  Qualifies for Shingles Vaccine? Yes   Zostavax completed No   Shingrix Completed?: Yes  Screening Tests Health Maintenance  Topic Date Due   Pneumonia Vaccine 64+ Years old (2 - PPSV23 if available, else PCV20) 12/21/2015   COVID-19 Vaccine (4 - Booster for Pfizer series) 10/19/2020   MAMMOGRAM  04/06/2022   TETANUS/TDAP  12/20/2024   COLONOSCOPY (Pts 45-55yrs Insurance coverage will need to be confirmed)  05/30/2025   INFLUENZA VACCINE  Completed    DEXA SCAN  Completed   Hepatitis C Screening  Completed   Zoster Vaccines- Shingrix  Completed   HPV VACCINES  Aged Out   URINE MICROALBUMIN  Discontinued    Health Maintenance  Health Maintenance Due  Topic Date Due   Pneumonia Vaccine 32+ Years old (2 - PPSV23 if available, else PCV20) 12/21/2015   COVID-19 Vaccine (4 - Booster for [place] series) 10/19/2020    Colorectal cancer screening: Type of screening: Colonoscopy. Completed 05/31/2015. Repeat every 10 years  Mammogram status: Completed 04/06/2020. Repeat every year  Bone Density status: Completed 08/28/2020. Results reflect: Bone density results: NORMAL. Repeat every 0 years.  Lung Cancer Screening: (Low Dose CT Chest recommended if Age 16-80 years, 30 pack-year currently smoking OR have quit w/in 15years.) does not qualify.   Lung Cancer Screening Referral: no  Additional Screening:  Hepatitis C Screening: does qualify; Completed 01/22/2017  Vision Screening: Recommended annual ophthalmology exams for early detection of glaucoma and other disorders of the eye. Is the patient up to date with their annual eye exam?  Yes  Who is the provider or what is the name of the office in which the patient attends annual eye exams? [facility] If pt is not established with a provider, would they like to be referred to a provider to establish care? No .   Dental Screening: Recommended annual dental exams for proper oral hygiene  Community Resource Referral / Chronic Care Management: CRR required this visit?  No   CCM required this visit?  No      Plan:     I have personally reviewed and noted the following in the  patient's chart:   Medical and social history Use of alcohol, tobacco or illicit drugs  Current medications and supplements including opioid prescriptions.  Functional ability and status Nutritional status Physical activity Advanced directives List of other physicians Hospitalizations, surgeries, and ER  visits in previous 12 months Vitals Screenings to include cognitive, depression, and falls Referrals and appointments  In addition, I have reviewed and discussed with patient certain preventive protocols, quality metrics, and best practice recommendations. A written personalized care plan for preventive services as well as general preventive health recommendations were provided to patient.     Kellie Simmering, LPN   [id number]   Nurse Notes:

## 2021-03-30 NOTE — Patient Instructions (Signed)
Ms. Annette Porter , Thank you for taking time to come for your Medicare Wellness Visit. I appreciate your ongoing commitment to your health goals. Please review the following plan we discussed and let me know if I can assist you in the future.   Screening recommendations/referrals: Colonoscopy: completed 05/31/2015 Mammogram: completed 04/06/2020 Bone Density: completed 08/28/2020 Recommended yearly ophthalmology/optometry visit for glaucoma screening and checkup Recommended yearly dental visit for hygiene and checkup  Vaccinations: Influenza vaccine: completed 03/27/2021 Pneumococcal vaccine: due Tdap vaccine: completed 12/21/2014, due 12/20/2024 Shingles vaccine: completed   Covid-19: 08/24/2020, 09/07/2019, 08/16/2019  Advanced directives: Advance directive discussed with you today.  Conditions/risks identified: none  Next appointment: Follow up in one year for your annual wellness visit    Preventive Care 65 Years and Older, Female Preventive care refers to lifestyle choices and visits with your health care provider that can promote health and wellness. What does preventive care include? A yearly physical exam. This is also called an annual well check. Dental exams once or twice a year. Routine eye exams. Ask your health care provider how often you should have your eyes checked. Personal lifestyle choices, including: Daily care of your teeth and gums. Regular physical activity. Eating a healthy diet. Avoiding tobacco and drug use. Limiting alcohol use. Practicing safe sex. Taking low-dose aspirin every day. Taking vitamin and mineral supplements as recommended by your health care provider. What happens during an annual well check? The services and screenings done by your health care provider during your annual well check will depend on your age, overall health, lifestyle risk factors, and family history of disease. Counseling  Your health care provider may ask you questions about  your: Alcohol use. Tobacco use. Drug use. Emotional well-being. Home and relationship well-being. Sexual activity. Eating habits. History of falls. Memory and ability to understand (cognition). Work and work Statistician. Reproductive health. Screening  You may have the following tests or measurements: Height, weight, and BMI. Blood pressure. Lipid and cholesterol levels. These may be checked every 5 years, or more frequently if you are over 29 years old. Skin check. Lung cancer screening. You may have this screening every year starting at age 52 if you have a 30-pack-year history of smoking and currently smoke or have quit within the past 15 years. Fecal occult blood test (FOBT) of the stool. You may have this test every year starting at age 74. Flexible sigmoidoscopy or colonoscopy. You may have a sigmoidoscopy every 5 years or a colonoscopy every 10 years starting at age 3. Hepatitis C blood test. Hepatitis B blood test. Sexually transmitted disease (STD) testing. Diabetes screening. This is done by checking your blood sugar (glucose) after you have not eaten for a while (fasting). You may have this done every 1-3 years. Bone density scan. This is done to screen for osteoporosis. You may have this done starting at age 67. Mammogram. This may be done every 1-2 years. Talk to your health care provider about how often you should have regular mammograms. Talk with your health care provider about your test results, treatment options, and if necessary, the need for more tests. Vaccines  Your health care provider may recommend certain vaccines, such as: Influenza vaccine. This is recommended every year. Tetanus, diphtheria, and acellular pertussis (Tdap, Td) vaccine. You may need a Td booster every 10 years. Zoster vaccine. You may need this after age 11. Pneumococcal 13-valent conjugate (PCV13) vaccine. One dose is recommended after age 36. Pneumococcal polysaccharide (PPSV23) vaccine.  One dose is  recommended after age 24. Talk to your health care provider about which screenings and vaccines you need and how often you need them. This information is not intended to replace advice given to you by your health care provider. Make sure you discuss any questions you have with your health care provider. Document Released: 06/23/2015 Document Revised: 02/14/2016 Document Reviewed: 03/28/2015 Elsevier Interactive Patient Education  2017 [place] Prevention in the Home Falls can cause injuries. They can happen to people of all ages. There are many things you can do to make your home safe and to help prevent falls. What can I do on the outside of my home? Regularly fix the edges of walkways and driveways and fix any cracks. Remove anything that might make you trip as you walk through a door, such as a raised step or threshold. Trim any bushes or trees on the path to your home. Use bright outdoor lighting. Clear any walking paths of anything that might make someone trip, such as rocks or tools. Regularly check to see if handrails are loose or broken. Make sure that both sides of any steps have handrails. Any raised decks and porches should have guardrails on the edges. Have any leaves, snow, or ice cleared regularly. Use sand or salt on walking paths during winter. Clean up any spills in your garage right away. This includes oil or grease spills. What can I do in the bathroom? Use night lights. Install grab bars by the toilet and in the tub and shower. Do not use towel bars as grab bars. Use non-skid mats or decals in the tub or shower. If you need to sit down in the shower, use a plastic, non-slip stool. Keep the floor dry. Clean up any water that spills on the floor as soon as it happens. Remove soap buildup in the tub or shower regularly. Attach bath mats securely with double-sided non-slip rug tape. Do not have throw rugs and other things on the floor that can make  you trip. What can I do in the bedroom? Use night lights. Make sure that you have a light by your bed that is easy to reach. Do not use any sheets or blankets that are too big for your bed. They should not hang down onto the floor. Have a firm chair that has side arms. You can use this for support while you get dressed. Do not have throw rugs and other things on the floor that can make you trip. What can I do in the kitchen? Clean up any spills right away. Avoid walking on wet floors. Keep items that you use a lot in easy-to-reach places. If you need to reach something above you, use a strong step stool that has a grab bar. Keep electrical cords out of the way. Do not use floor polish or wax that makes floors slippery. If you must use wax, use non-skid floor wax. Do not have throw rugs and other things on the floor that can make you trip. What can I do with my stairs? Do not leave any items on the stairs. Make sure that there are handrails on both sides of the stairs and use them. Fix handrails that are broken or loose. Make sure that handrails are as long as the stairways. Check any carpeting to make sure that it is firmly attached to the stairs. Fix any carpet that is loose or worn. Avoid having throw rugs at the top or bottom of the stairs. If you  do have throw rugs, attach them to the floor with carpet tape. Make sure that you have a light switch at the top of the stairs and the bottom of the stairs. If you do not have them, ask someone to add them for you. What else can I do to help prevent falls? Wear shoes that: Do not have high heels. Have rubber bottoms. Are comfortable and fit you well. Are closed at the toe. Do not wear sandals. If you use a stepladder: Make sure that it is fully opened. Do not climb a closed stepladder. Make sure that both sides of the stepladder are locked into place. Ask someone to hold it for you, if possible. Clearly mark and make sure that you can  see: Any grab bars or handrails. First and last steps. Where the edge of each step is. Use tools that help you move around (mobility aids) if they are needed. These include: Canes. Walkers. Scooters. Crutches. Turn on the lights when you go into a dark area. Replace any light bulbs as soon as they burn out. Set up your furniture so you have a clear path. Avoid moving your furniture around. If any of your floors are uneven, fix them. If there are any pets around you, be aware of where they are. Review your medicines with your doctor. Some medicines can make you feel dizzy. This can increase your chance of falling. Ask your doctor what other things that you can do to help prevent falls. This information is not intended to replace advice given to you by your health care provider. Make sure you discuss any questions you have with your health care provider. Document Released: 03/23/2009 Document Revised: 11/02/2015 Document Reviewed: 07/01/2014 Elsevier Interactive Patient Education  2017 [employer].

## 2021-04-02 DIAGNOSIS — M7632 Iliotibial band syndrome, left leg: Secondary | ICD-10-CM | POA: Diagnosis not present

## 2021-04-02 DIAGNOSIS — M545 Low back pain, unspecified: Secondary | ICD-10-CM | POA: Diagnosis not present

## 2021-04-04 ENCOUNTER — Other Ambulatory Visit: Payer: Self-pay

## 2021-04-04 ENCOUNTER — Encounter: Payer: Self-pay | Admitting: Podiatry

## 2021-04-04 ENCOUNTER — Ambulatory Visit (INDEPENDENT_AMBULATORY_CARE_PROVIDER_SITE_OTHER): Payer: Medicare Other

## 2021-04-04 ENCOUNTER — Ambulatory Visit (INDEPENDENT_AMBULATORY_CARE_PROVIDER_SITE_OTHER): Payer: Medicare Other | Admitting: Podiatry

## 2021-04-04 DIAGNOSIS — M19272 Secondary osteoarthritis, left ankle and foot: Secondary | ICD-10-CM

## 2021-04-04 DIAGNOSIS — M25872 Other specified joint disorders, left ankle and foot: Secondary | ICD-10-CM | POA: Diagnosis not present

## 2021-04-04 DIAGNOSIS — U071 COVID-19: Secondary | ICD-10-CM | POA: Diagnosis not present

## 2021-04-04 DIAGNOSIS — M778 Other enthesopathies, not elsewhere classified: Secondary | ICD-10-CM

## 2021-04-04 DIAGNOSIS — M2142 Flat foot [pes planus] (acquired), left foot: Secondary | ICD-10-CM

## 2021-04-04 NOTE — Progress Notes (Signed)
  Subjective:  Patient ID: Annette Porter, female    DOB: 1950-12-02,  MRN: [id number]  Chief Complaint  Patient presents with   Foot Pain    New pt-Hx of Left foot fracture-twice. Patient having issues with ankle on that foot    70 y.o. female presents with the above complaint. History confirmed with patient.  Previously has injured this foot and had a fracture when she was in her 65s.  She notes new pain and swelling mostly on the outside of the ankle under the ankle bone.  Intermittent and worse when she is walking  Objective:  Physical Exam: warm, good capillary refill, no trophic changes or ulcerative lesions, normal DP and PT pulses, and normal sensory exam. Left Foot: Pes planus deformity with significant valgus especially on weightbearing, the heel is impinged under the calcaneus, minimal reproducible pain on palpation or manipulation, good strength 5 out of 5 in eversion  Radiographs: Multiple views x-ray of the left foot: Severe pes planus deformity with scattered degenerative changes midfoot and hindfoot Assessment:   1. Acquired pes planus, left   2. Impingement syndrome of left ankle   3. Other secondary osteoarthritis of left foot      Plan:  Patient was evaluated and treated and all questions answered.  Reviewed the radiographs with her and discussed the treatment options for pes planus deformity.  I think most of the pain she is having is subfibular impingement.  Her peroneal tendons are in good strength and do not have any reproducible pain on palpation.  I discussed her treatment options including nonsurgical and surgical treatment.  I recommended supporting with a foot orthosis and a prefabricated 1 was dispensed today with power steps.  Discussed eventual surgical intervention if this does not improve or worsens as well as injection therapy and possible custom molded bracing.  We will pursue this further with an MRI if it does not improve with shoe gear changes and  orthotics.  Return in about 2 months (around 06/04/2021) for follow up on arthritis, flat feet.

## 2021-04-09 ENCOUNTER — Other Ambulatory Visit: Payer: Self-pay

## 2021-04-09 ENCOUNTER — Ambulatory Visit
Admission: RE | Admit: 2021-04-09 | Discharge: 2021-04-09 | Disposition: A | Discharge status: Home / Self Care | Payer: Medicare Other | Source: Ambulatory Visit | Attending: Internal Medicine | Admitting: Internal Medicine

## 2021-04-09 DIAGNOSIS — Z1231 Encounter for screening mammogram for malignant neoplasm of breast: Secondary | ICD-10-CM | POA: Insufficient documentation

## 2021-04-10 NOTE — Progress Notes (Signed)
Please let pt know this was normal.

## 2021-04-17 DIAGNOSIS — Z853 Personal history of malignant neoplasm of breast: Secondary | ICD-10-CM | POA: Diagnosis not present

## 2021-04-20 DIAGNOSIS — M7632 Iliotibial band syndrome, left leg: Secondary | ICD-10-CM | POA: Diagnosis not present

## 2021-04-20 DIAGNOSIS — M545 Low back pain, unspecified: Secondary | ICD-10-CM | POA: Diagnosis not present

## 2021-04-23 ENCOUNTER — Telehealth: Payer: Self-pay | Admitting: Internal Medicine

## 2021-04-23 DIAGNOSIS — I10 Essential (primary) hypertension: Secondary | ICD-10-CM

## 2021-04-23 NOTE — Telephone Encounter (Signed)
Copied from [place] [phone] [phone]. Topic: General - Other >> Apr 23, 2021 10:33 AM Valere Dross wrote: Reason for CRM: Pt called in stating she wanted to speak with PCP or PCP nurse about her hydrochlorothiazide medication dosage, pt requested a call back, please advise.

## 2021-04-24 ENCOUNTER — Other Ambulatory Visit: Payer: Self-pay | Admitting: Internal Medicine

## 2021-04-24 MED ORDER — HYDROCHLOROTHIAZIDE 25 MG PO TABS: 25.0000 mg | ORAL_TABLET | Freq: Every day | ORAL | 3 refills | Status: DC

## 2021-04-24 NOTE — Telephone Encounter (Signed)
Per Dr.Vigg sent over 90 day supply for patient Hydrochlorothiazide and patient is requesting for the prescription to be sent to [facility] instead of [facility]. Prescription reordered and sent to requested pharmacy. Patient notified and verbalized understanding and has no further questions at this time.

## 2021-04-24 NOTE — Progress Notes (Unsigned)
ctz

## 2021-04-24 NOTE — Telephone Encounter (Signed)
Sent in 90 days supply not sure why this happened pl let pt know thnx

## 2021-05-01 ENCOUNTER — Telehealth: Payer: Self-pay | Admitting: Internal Medicine

## 2021-05-01 NOTE — Telephone Encounter (Signed)
Copied from [place] [id number]. Topic: General - Other >> Apr 30, 2021  9:49 AM Annette Porter A wrote: Reason for CRM: The patient would like to be contacted by a member of staff regarding a prior authorization for their HCTZ  The patient has less than two weeks worth of the medication   The patient has additional information that they would like to relay to staff directly   Please contact further when available

## 2021-05-02 NOTE — Telephone Encounter (Signed)
Pt called back to speak with nurse or Dr. Neomia Dear about the dose of HCTZ and that the pharmacy needed more info and needs provider to call them ( [facility]) / Pt would like to get this done before she runs out of meds /pt has a little over a week left worth of this medication / please advise /

## 2021-05-02 NOTE — Telephone Encounter (Signed)
Called and clarified RX with the pharmacy. They were trying to fill the previous 50 mg tablet prescription, patient recently decreased to 25 mg. Ran 25 mg through insurance and it is able to be filled today. Pharmacist states that she was putting a rush on this for the patient.    Called and notified patient of the conversation with the pharmacy. Advised her that her prescription was being sent out to her.

## 2021-05-16 ENCOUNTER — Other Ambulatory Visit: Payer: Self-pay | Admitting: Internal Medicine

## 2021-05-16 DIAGNOSIS — I2583 Coronary atherosclerosis due to lipid rich plaque: Secondary | ICD-10-CM

## 2021-05-16 DIAGNOSIS — I10 Essential (primary) hypertension: Secondary | ICD-10-CM

## 2021-05-16 DIAGNOSIS — I2510 Atherosclerotic heart disease of native coronary artery without angina pectoris: Secondary | ICD-10-CM

## 2021-05-16 NOTE — Telephone Encounter (Signed)
Requested Prescriptions  Pending Prescriptions Disp Refills  . metoprolol succinate (TOPROL-XL) 50 MG 24 hr tablet [Pharmacy Med Name: Metoprolol Succinate ER 50 MG Oral Tablet Extended Release 24 Hour] 90 tablet 1    Sig: TAKE 1 TABLET BY MOUTH  DAILY WITH OR IMMEDIATELY  FOLLOWING A MEAL     Cardiovascular:  Beta Blockers Passed - 05/16/2021  6:22 AM      Passed - Last BP in normal range    BP Readings from Last 1 Encounters:  03/27/21 (!) 113/58         Passed - Last Heart Rate in normal range    Pulse Readings from Last 1 Encounters:  03/27/21 61         Passed - Valid encounter within last 6 months    Recent Outpatient Visits          1 month ago Need for influenza vaccination   [facility] Vigg, Avanti, MD   5 months ago Hypothyroidism, unspecified type   [facility] Vigg, Avanti, MD   7 months ago Essential hypertension   [place] Vigg, Avanti, MD   7 months ago Hypertension, unspecified type   [facility] Vigg, Avanti, MD   10 months ago Morbid obesity ([place])   [place], Barbaraann Faster, NP      Future Appointments            In 1 month Vigg, Avanti, MD [facility], [facility]   In 10 months  [facility], [place]

## 2021-05-18 DIAGNOSIS — M7632 Iliotibial band syndrome, left leg: Secondary | ICD-10-CM | POA: Diagnosis not present

## 2021-05-18 DIAGNOSIS — M545 Low back pain, unspecified: Secondary | ICD-10-CM | POA: Diagnosis not present

## 2021-05-22 DIAGNOSIS — M7632 Iliotibial band syndrome, left leg: Secondary | ICD-10-CM | POA: Diagnosis not present

## 2021-05-22 DIAGNOSIS — M545 Low back pain, unspecified: Secondary | ICD-10-CM | POA: Diagnosis not present

## 2021-05-30 DIAGNOSIS — M7632 Iliotibial band syndrome, left leg: Secondary | ICD-10-CM | POA: Diagnosis not present

## 2021-05-30 DIAGNOSIS — M545 Low back pain, unspecified: Secondary | ICD-10-CM | POA: Diagnosis not present

## 2021-06-06 DIAGNOSIS — M545 Low back pain, unspecified: Secondary | ICD-10-CM | POA: Diagnosis not present

## 2021-06-06 DIAGNOSIS — M7632 Iliotibial band syndrome, left leg: Secondary | ICD-10-CM | POA: Diagnosis not present

## 2021-06-12 DIAGNOSIS — M545 Low back pain, unspecified: Secondary | ICD-10-CM | POA: Diagnosis not present

## 2021-06-12 DIAGNOSIS — M7632 Iliotibial band syndrome, left leg: Secondary | ICD-10-CM | POA: Diagnosis not present

## 2021-06-13 ENCOUNTER — Encounter: Payer: Self-pay | Admitting: Podiatry

## 2021-06-13 ENCOUNTER — Other Ambulatory Visit: Payer: Self-pay

## 2021-06-13 ENCOUNTER — Ambulatory Visit (INDEPENDENT_AMBULATORY_CARE_PROVIDER_SITE_OTHER): Payer: Medicare Other | Admitting: Podiatry

## 2021-06-13 DIAGNOSIS — M25872 Other specified joint disorders, left ankle and foot: Secondary | ICD-10-CM | POA: Diagnosis not present

## 2021-06-13 DIAGNOSIS — M2142 Flat foot [pes planus] (acquired), left foot: Secondary | ICD-10-CM

## 2021-06-13 NOTE — Progress Notes (Signed)
°  Subjective:  Patient ID: Annette Porter, female    DOB: [birth year],  MRN: [id number]  Chief Complaint  Patient presents with   Arthritis    "Its doing a lot better.  I have good days and bad days, but its better since I got the arch supports"    71 y.o. female presents with the above complaint. History confirmed with patient.  She is doing very well the inserts have limited most of her pain.  She occasionally needs Tylenol or Motrin when it bothers her.  Objective:  Physical Exam: warm, good capillary refill, no trophic changes or ulcerative lesions, normal DP and PT pulses, and normal sensory exam. Left Foot: Pes planus deformity with significant valgus especially on weightbearing, the heel is impinged under the calcaneus, no pain on palpation or with range of motion today good strength 5 out of 5  Radiographs: Multiple views x-ray of the left foot: Severe pes planus deformity with scattered degenerative changes midfoot and hindfoot Assessment:   1. Acquired pes planus, left   2. Impingement syndrome of left ankle      Plan:  Patient was evaluated and treated and all questions answered.  She is doing very well and is responding well to a longitudinal foot support orthosis that she is wearing in her shoes.  She should continue wearing as I think.  Using topical anti-inflammatories as needed when it hurts as well as oral medications.  Discussed with her long-term that a custom molded orthosis may offer more options of which she is aware with that and will last longer than a prefabricated.  Also discussed if it worsens or begins to call significant pain that injection of the subtalar joint and sinus tarsi may offer relief as well.  She will return as needed for this.  Return if symptoms worsen or fail to improve.

## 2021-06-20 DIAGNOSIS — M545 Low back pain, unspecified: Secondary | ICD-10-CM | POA: Diagnosis not present

## 2021-06-20 DIAGNOSIS — M7632 Iliotibial band syndrome, left leg: Secondary | ICD-10-CM | POA: Diagnosis not present

## 2021-06-27 ENCOUNTER — Other Ambulatory Visit: Payer: Self-pay

## 2021-06-27 ENCOUNTER — Encounter: Payer: Self-pay | Admitting: Internal Medicine

## 2021-06-27 ENCOUNTER — Ambulatory Visit: Payer: Medicare Other | Admitting: Internal Medicine

## 2021-06-27 ENCOUNTER — Ambulatory Visit (INDEPENDENT_AMBULATORY_CARE_PROVIDER_SITE_OTHER): Payer: Medicare Other | Admitting: Internal Medicine

## 2021-06-27 VITALS — BP 130/77 | HR 70 | Temp 98.1°F | Ht 61.02 in | Wt 224.6 lb

## 2021-06-27 DIAGNOSIS — I2510 Atherosclerotic heart disease of native coronary artery without angina pectoris: Secondary | ICD-10-CM | POA: Diagnosis not present

## 2021-06-27 DIAGNOSIS — I2583 Coronary atherosclerosis due to lipid rich plaque: Secondary | ICD-10-CM

## 2021-06-27 DIAGNOSIS — M7632 Iliotibial band syndrome, left leg: Secondary | ICD-10-CM | POA: Diagnosis not present

## 2021-06-27 DIAGNOSIS — M545 Low back pain, unspecified: Secondary | ICD-10-CM | POA: Diagnosis not present

## 2021-06-27 DIAGNOSIS — I10 Essential (primary) hypertension: Secondary | ICD-10-CM

## 2021-06-27 DIAGNOSIS — E039 Hypothyroidism, unspecified: Secondary | ICD-10-CM | POA: Diagnosis not present

## 2021-06-27 NOTE — Progress Notes (Signed)
BP 130/77    Pulse 70    Temp 98.1 F (36.7 C) (Oral)    Ht 5' 1.02" (1.55 m)    Wt 224 lb 9.6 oz (101.9 kg)    SpO2 98%    BMI 42.40 kg/m    Subjective:    Patient ID: Annette Porter, female    DOB: 1951-02-13, 71 y.o.   MRN: [id number]  Chief Complaint  Patient presents with   Hypothyroidism   Hypertension   Hyperlipidemia   Coronary Artery Disease   Gastroesophageal Reflux    HPI: Annette Porter is a 71 y.o. female  Ho CAD :  Cath :  07/20/98 revealed 100% RCA stenosis , 50% left circumflex  Had a failed Attempted PCI of the RCA was unsuccessful. Medical management   Hypertension This is a chronic problem. The current episode started more than 1 year ago.  Hyperlipidemia This is a chronic problem. The problem is controlled.  Coronary Artery Disease Presents for follow-up visit. Risk factors include hyperlipidemia and hypertension.  Arthritis Presents for follow-up visit.  Foot Injury    Chief Complaint  Patient presents with   Hypothyroidism   Hypertension   Hyperlipidemia   Coronary Artery Disease   Gastroesophageal Reflux    Relevant past medical, surgical, family and social history reviewed and updated as indicated. Interim medical history since our last visit reviewed. Allergies and medications reviewed and updated.  Review of Systems  Musculoskeletal:  Positive for arthritis.   Per HPI unless specifically indicated above     Objective:    BP 130/77    Pulse 70    Temp 98.1 F (36.7 C) (Oral)    Ht 5' 1.02" (1.55 m)    Wt 224 lb 9.6 oz (101.9 kg)    SpO2 98%    BMI 42.40 kg/m   Wt Readings from Last 3 Encounters:  06/27/21 224 lb 9.6 oz (101.9 kg)  03/30/21 217 lb (98.4 kg)  03/27/21 217 lb (98.4 kg)    Physical Exam Vitals and nursing note reviewed.  Constitutional:      General: She is not in acute distress.    Appearance: Normal appearance. She is not ill-appearing or diaphoretic.  Eyes:     Conjunctiva/sclera: Conjunctivae normal.   Cardiovascular:     Rate and Rhythm: Normal rate.     Heart sounds: Murmur heard.  Pulmonary:     Effort: Pulmonary effort is normal. No respiratory distress.     Breath sounds: No rhonchi.  Abdominal:     General: Abdomen is flat. Bowel sounds are normal. There is no distension.     Palpations: Abdomen is soft. There is no mass.     Tenderness: There is no abdominal tenderness. There is no guarding.  Musculoskeletal:        General: Tenderness present. No swelling, deformity or signs of injury.     Left lower leg: No edema.     Comments: Foot pain +   Skin:    General: Skin is warm and dry.     Coloration: Skin is not jaundiced.     Findings: No erythema.  Neurological:     Mental Status: She is alert.    Results for orders placed or performed in visit on 03/20/21  CBC with Differential/Platelet  Result Value Ref Range   WBC 7.1 3.4 - 10.8 x10E3/uL   RBC 4.55 3.77 - 5.28 x10E6/uL   Hemoglobin 13.7 11.1 - 15.9 g/dL   Hematocrit 41.5  34.0 - 46.6 %   MCV 91 79 - 97 fL   MCH 30.1 26.6 - 33.0 pg   MCHC 33.0 31.5 - 35.7 g/dL   RDW 12.9 11.7 - 15.4 %   Platelets 244 150 - 450 x10E3/uL   Neutrophils 63 Not Estab. %   Lymphs 22 Not Estab. %   Monocytes 12 Not Estab. %   Eos 2 Not Estab. %   Basos 1 Not Estab. %   Neutrophils Absolute 4.5 1.4 - 7.0 x10E3/uL   Lymphocytes Absolute 1.6 0.7 - 3.1 x10E3/uL   Monocytes Absolute 0.8 0.1 - 0.9 x10E3/uL   EOS (ABSOLUTE) 0.2 0.0 - 0.4 x10E3/uL   Basophils Absolute 0.1 0.0 - 0.2 x10E3/uL   Immature Granulocytes 0 Not Estab. %   Immature Grans (Abs) 0.0 0.0 - 0.1 x10E3/uL  Comprehensive metabolic panel  Result Value Ref Range   Glucose 95 70 - 99 mg/dL   BUN 15 8 - 27 mg/dL   Creatinine, Ser 0.63 0.57 - 1.00 mg/dL   eGFR 95 >59 mL/min/1.73   BUN/Creatinine Ratio 24 12 - 28   Sodium 133 (L) 134 - 144 mmol/L   Potassium 4.0 3.5 - 5.2 mmol/L   Chloride 95 (L) 96 - 106 mmol/L   CO2 24 20 - 29 mmol/L   Calcium 9.4 8.7 - 10.3 mg/dL    Total Protein 6.5 6.0 - 8.5 g/dL   Albumin 4.1 3.7 - 4.7 g/dL   Globulin, Total 2.4 1.5 - 4.5 g/dL   Albumin/Globulin Ratio 1.7 1.2 - 2.2   Bilirubin Total 0.3 0.0 - 1.2 mg/dL   Alkaline Phosphatase 90 44 - 121 IU/L   AST 32 0 - 40 IU/L   ALT 36 (H) 0 - 32 IU/L  TSH  Result Value Ref Range   TSH 0.885 0.450 - 4.500 uIU/mL        Current Outpatient Medications:    aspirin 81 MG tablet, Take 81 mg by mouth daily., Disp: , Rfl:    atorvastatin (LIPITOR) 40 MG tablet, Take 1 tablet (40 mg total) by mouth daily., Disp: 90 tablet, Rfl: 2   Calcium Carbonate (CALCIUM 600 PO), Take 1 tablet by mouth 2 (two) times daily., Disp: , Rfl:    Cholecalciferol (VITAMIN D3) 1000 UNITS CAPS, Take 1 capsule by mouth daily., Disp: , Rfl:    docusate sodium (COLACE) 100 MG capsule, Take 100 mg by mouth daily., Disp: , Rfl:    ezetimibe (ZETIA) 10 MG tablet, Take 1 tablet (10 mg total) by mouth daily., Disp: 90 tablet, Rfl: 2   famotidine (PEPCID) 40 MG tablet, Take 40 mg by mouth daily., Disp: , Rfl:    hydrochlorothiazide (HYDRODIURIL) 25 MG tablet, Take 1 tablet (25 mg total) by mouth daily., Disp: 90 tablet, Rfl: 3   isosorbide mononitrate (IMDUR) 30 MG 24 hr tablet, Take 30 mg by mouth daily., Disp: , Rfl:    isosorbide mononitrate (IMDUR) 60 MG 24 hr tablet, TAKE 1 TABLET BY MOUTH ONCE DAILY, Disp: , Rfl:    levothyroxine (SYNTHROID) 112 MCG tablet, Take 1 tablet (112 mcg total) by mouth daily., Disp: 90 tablet, Rfl: 2   metoprolol succinate (TOPROL-XL) 50 MG 24 hr tablet, TAKE 1 TABLET BY MOUTH  DAILY WITH OR IMMEDIATELY  FOLLOWING A MEAL, Disp: 90 tablet, Rfl: 0   montelukast (SINGULAIR) 10 MG tablet, Take 1 tablet (10 mg total) by mouth at bedtime., Disp: 90 tablet, Rfl: 4   nitroGLYCERIN (NITROSTAT) 0.4 MG SL  tablet, Place 0.4 mg under the tongue every 5 (five) minutes as needed for chest pain., Disp: , Rfl:    vitamin B-12 (CYANOCOBALAMIN) 250 MCG tablet, Take 500 mcg by mouth daily. , Disp: ,  Rfl:    hydrochlorothiazide (HYDRODIURIL) 25 MG tablet, Take 1 tablet (25 mg total) by mouth daily for 14 days., Disp: 14 tablet, Rfl: 0    Assessment & Plan:   Back pain is getting Pt and has had a problem with OA in such  Has had a problem with pelvic tilt.   Multiple views x-ray of the left foot: Severe pes planus deformity with scattered degenerative changes midfoot and hindfoot Using custom inserts for shoes to get a shot if needs to injection of the subtalar joint Takes tyelnol arthritis otc  HLD : Consider increasing lipitor 40 mg to 80 mg Zetia 10 mg  recheck FLP, check LFT's work on diet, SE of meds explained to pt. low fat and high fiber diet explained to pt.  CAD medical mx, continue mx per cards  Has had stress testing for such

## 2021-06-28 ENCOUNTER — Other Ambulatory Visit: Payer: Self-pay

## 2021-06-28 ENCOUNTER — Other Ambulatory Visit: Payer: Medicare Other

## 2021-06-28 DIAGNOSIS — M545 Low back pain, unspecified: Secondary | ICD-10-CM

## 2021-06-28 DIAGNOSIS — E039 Hypothyroidism, unspecified: Secondary | ICD-10-CM

## 2021-06-29 LAB — LIPID PANEL W/O CHOL/HDL RATIO
Cholesterol, Total: 142 mg/dL (ref 100–199)
HDL: 52 mg/dL (ref >39)
LDL Chol Calc (NIH): 69 mg/dL (ref 0–99)
Triglycerides: 114 mg/dL (ref 0–149)
VLDL Cholesterol Cal: 21 mg/dL (ref 5–40)

## 2021-06-29 LAB — CMP14+EGFR
ALT: 35 IU/L — ABNORMAL HIGH (ref 0–32)
AST: 29 IU/L (ref 0–40)
Albumin/Globulin Ratio: 1.7 (ref 1.2–2.2)
Albumin: 4.3 g/dL (ref 3.7–4.7)
Alkaline Phosphatase: 83 IU/L (ref 44–121)
BUN/Creatinine Ratio: 13 (ref 12–28)
BUN: 9 mg/dL (ref 8–27)
Bilirubin Total: 0.3 mg/dL (ref 0.0–1.2)
CO2: 24 mmol/L (ref 20–29)
Calcium: 9.5 mg/dL (ref 8.7–10.3)
Chloride: 98 mmol/L (ref 96–106)
Creatinine, Ser: 0.72 mg/dL (ref 0.57–1.00)
Globulin, Total: 2.5 g/dL (ref 1.5–4.5)
Glucose: 88 mg/dL (ref 70–99)
Potassium: 4.5 mmol/L (ref 3.5–5.2)
Sodium: 137 mmol/L (ref 134–144)
Total Protein: 6.8 g/dL (ref 6.0–8.5)
eGFR: 89 mL/min/{1.73_m2} (ref >59)

## 2021-06-29 LAB — CBC WITH DIFFERENTIAL/PLATELET
Basophils Absolute: 0.1 10*3/uL (ref 0.0–0.2)
Basos: 1 %
EOS (ABSOLUTE): 0.2 10*3/uL (ref 0.0–0.4)
Eos: 2 %
Hematocrit: 45.2 % (ref 34.0–46.6)
Hemoglobin: 15.3 g/dL (ref 11.1–15.9)
Immature Grans (Abs): 0 10*3/uL (ref 0.0–0.1)
Immature Granulocytes: 0 %
Lymphocytes Absolute: 1.8 10*3/uL (ref 0.7–3.1)
Lymphs: 24 %
MCH: 31.2 pg (ref 26.6–33.0)
MCHC: 33.8 g/dL (ref 31.5–35.7)
MCV: 92 fL (ref 79–97)
Monocytes Absolute: 0.7 10*3/uL (ref 0.1–0.9)
Monocytes: 9 %
Neutrophils Absolute: 4.9 10*3/uL (ref 1.4–7.0)
Neutrophils: 64 %
Platelets: 263 10*3/uL (ref 150–450)
RBC: 4.91 x10E6/uL (ref 3.77–5.28)
RDW: 13.3 % (ref 11.7–15.4)
WBC: 7.6 10*3/uL (ref 3.4–10.8)

## 2021-06-29 LAB — TSH: TSH: 11.1 u[IU]/mL — ABNORMAL HIGH (ref 0.450–4.500)

## 2021-07-02 MED ORDER — LEVOTHYROXINE SODIUM 125 MCG PO TABS: 125.0000 ug | ORAL_TABLET | Freq: Every day | ORAL | 3 refills | Status: DC

## 2021-07-02 NOTE — Progress Notes (Signed)
Please let pt know this was ABnormal. WILL NEED TO increase synthroid to 125 mcg please have pt fu in 6 weeks with repeat labs. Thnx. Need FT/ TSH in 6 weeks.

## 2021-07-12 DIAGNOSIS — M545 Low back pain, unspecified: Secondary | ICD-10-CM | POA: Diagnosis not present

## 2021-07-12 DIAGNOSIS — M7632 Iliotibial band syndrome, left leg: Secondary | ICD-10-CM | POA: Diagnosis not present

## 2021-07-17 DIAGNOSIS — M545 Low back pain, unspecified: Secondary | ICD-10-CM | POA: Diagnosis not present

## 2021-07-17 DIAGNOSIS — M7632 Iliotibial band syndrome, left leg: Secondary | ICD-10-CM | POA: Diagnosis not present

## 2021-07-18 ENCOUNTER — Other Ambulatory Visit: Payer: Self-pay | Admitting: Internal Medicine

## 2021-07-18 ENCOUNTER — Other Ambulatory Visit: Payer: Self-pay | Admitting: Nurse Practitioner

## 2021-07-18 DIAGNOSIS — I2583 Coronary atherosclerosis due to lipid rich plaque: Secondary | ICD-10-CM

## 2021-07-18 DIAGNOSIS — I2510 Atherosclerotic heart disease of native coronary artery without angina pectoris: Secondary | ICD-10-CM

## 2021-07-18 DIAGNOSIS — I10 Essential (primary) hypertension: Secondary | ICD-10-CM

## 2021-07-18 NOTE — Telephone Encounter (Signed)
Requested medication (s) are due for refill today:   Yes  Requested medication (s) are on the active medication list:   Yes  Future visit scheduled:   Yes   Last ordered: 07/06/2020 #90, 4 refills  Returned because pharmacy (mail order) requesting a 1 yr. supply   Requested Prescriptions  Pending Prescriptions Disp Refills   montelukast (SINGULAIR) 10 MG tablet [Pharmacy Med Name: Montelukast Sodium 10 MG Oral Tablet] 90 tablet 3    Sig: TAKE 1 TABLET BY MOUTH AT  BEDTIME     Pulmonology:  Leukotriene Inhibitors Passed - 07/18/2021 10:11 AM      Passed - Valid encounter within last 12 months    Recent Outpatient Visits           3 weeks ago Primary hypertension   [facility] Vigg, Avanti, MD   3 months ago Need for influenza vaccination   [facility] Vigg, Avanti, MD   8 months ago Hypothyroidism, unspecified type   [place] Vigg, Avanti, MD   9 months ago Essential hypertension   [facility] Vigg, Avanti, MD   10 months ago Hypertension, unspecified type   [place] Vigg, Avanti, MD       Future Appointments             In 1 month Vigg, Avanti, MD [place], [facility]   In 5 months Vigg, Avanti, MD [facility], [facility]   In 8 months  [facility], [place]

## 2021-07-18 NOTE — Telephone Encounter (Signed)
Requested medication (s) are due for refill today:   Yes  Requested medication (s) are on the active medication list:   Yes  Future visit scheduled:   Yes   Last ordered: 05/16/2021 #90, 0 refills  Returned because pharmacy (mail order) is requesting a 1 yr. supply   Requested Prescriptions  Pending Prescriptions Disp Refills   metoprolol succinate (TOPROL-XL) 50 MG 24 hr tablet [Pharmacy Med Name: Metoprolol Succinate ER 50 MG Oral Tablet Extended Release 24 Hour] 90 tablet 3    Sig: TAKE 1 TABLET BY MOUTH  DAILY WITH OR IMMEDIATELY  FOLLOWING A MEAL     Cardiovascular:  Beta Blockers Passed - 07/18/2021 10:06 AM      Passed - Last BP in normal range    BP Readings from Last 1 Encounters:  06/27/21 130/77          Passed - Last Heart Rate in normal range    Pulse Readings from Last 1 Encounters:  06/27/21 70          Passed - Valid encounter within last 6 months    Recent Outpatient Visits           3 weeks ago Primary hypertension   [facility] Vigg, Avanti, MD   3 months ago Need for influenza vaccination   [place] Vigg, Avanti, MD   8 months ago Hypothyroidism, unspecified type   [place] Vigg, Avanti, MD   9 months ago Essential hypertension   [facility] Vigg, Avanti, MD   10 months ago Hypertension, unspecified type   [place] Vigg, Avanti, MD       Future Appointments             In 1 month Vigg, Avanti, MD [place], [facility]   In 5 months Vigg, Avanti, MD [facility], [place]   In 8 months  [facility], [place]

## 2021-07-19 ENCOUNTER — Telehealth: Payer: Self-pay

## 2021-07-19 DIAGNOSIS — E119 Type 2 diabetes mellitus without complications: Secondary | ICD-10-CM | POA: Diagnosis not present

## 2021-07-19 DIAGNOSIS — E782 Mixed hyperlipidemia: Secondary | ICD-10-CM | POA: Diagnosis not present

## 2021-07-19 DIAGNOSIS — I10 Essential (primary) hypertension: Secondary | ICD-10-CM | POA: Diagnosis not present

## 2021-07-19 DIAGNOSIS — I480 Paroxysmal atrial fibrillation: Secondary | ICD-10-CM | POA: Diagnosis not present

## 2021-07-19 DIAGNOSIS — I25118 Atherosclerotic heart disease of native coronary artery with other forms of angina pectoris: Secondary | ICD-10-CM | POA: Diagnosis not present

## 2021-07-19 DIAGNOSIS — I77811 Abdominal aortic ectasia: Secondary | ICD-10-CM | POA: Diagnosis not present

## 2021-07-19 NOTE — Telephone Encounter (Signed)
Pharmacist, Mickel Baas, calling to verify hydrochlorothiazide dosage. Verbalizes understanding.

## 2021-07-24 DIAGNOSIS — M7632 Iliotibial band syndrome, left leg: Secondary | ICD-10-CM | POA: Diagnosis not present

## 2021-07-24 DIAGNOSIS — M545 Low back pain, unspecified: Secondary | ICD-10-CM | POA: Diagnosis not present

## 2021-07-31 DIAGNOSIS — M545 Low back pain, unspecified: Secondary | ICD-10-CM | POA: Diagnosis not present

## 2021-07-31 DIAGNOSIS — M7632 Iliotibial band syndrome, left leg: Secondary | ICD-10-CM | POA: Diagnosis not present

## 2021-08-02 DIAGNOSIS — D2261 Melanocytic nevi of right upper limb, including shoulder: Secondary | ICD-10-CM | POA: Diagnosis not present

## 2021-08-02 DIAGNOSIS — L820 Inflamed seborrheic keratosis: Secondary | ICD-10-CM | POA: Diagnosis not present

## 2021-08-02 DIAGNOSIS — D2271 Melanocytic nevi of right lower limb, including hip: Secondary | ICD-10-CM | POA: Diagnosis not present

## 2021-08-02 DIAGNOSIS — D2272 Melanocytic nevi of left lower limb, including hip: Secondary | ICD-10-CM | POA: Diagnosis not present

## 2021-08-02 DIAGNOSIS — L821 Other seborrheic keratosis: Secondary | ICD-10-CM | POA: Diagnosis not present

## 2021-08-02 DIAGNOSIS — D2262 Melanocytic nevi of left upper limb, including shoulder: Secondary | ICD-10-CM | POA: Diagnosis not present

## 2021-08-02 DIAGNOSIS — L570 Actinic keratosis: Secondary | ICD-10-CM | POA: Diagnosis not present

## 2021-08-02 DIAGNOSIS — D225 Melanocytic nevi of trunk: Secondary | ICD-10-CM | POA: Diagnosis not present

## 2021-08-02 DIAGNOSIS — L538 Other specified erythematous conditions: Secondary | ICD-10-CM | POA: Diagnosis not present

## 2021-08-07 DIAGNOSIS — M7632 Iliotibial band syndrome, left leg: Secondary | ICD-10-CM | POA: Diagnosis not present

## 2021-08-07 DIAGNOSIS — M545 Low back pain, unspecified: Secondary | ICD-10-CM | POA: Diagnosis not present

## 2021-08-13 ENCOUNTER — Other Ambulatory Visit: Payer: Medicare Other

## 2021-08-13 ENCOUNTER — Other Ambulatory Visit: Payer: Self-pay

## 2021-08-13 DIAGNOSIS — E039 Hypothyroidism, unspecified: Secondary | ICD-10-CM

## 2021-08-14 LAB — TSH: TSH: 2.15 u[IU]/mL (ref 0.450–4.500)

## 2021-08-17 ENCOUNTER — Other Ambulatory Visit: Payer: Self-pay

## 2021-08-17 ENCOUNTER — Encounter: Payer: Self-pay | Admitting: Internal Medicine

## 2021-08-17 ENCOUNTER — Ambulatory Visit (INDEPENDENT_AMBULATORY_CARE_PROVIDER_SITE_OTHER): Payer: Medicare Other | Admitting: Internal Medicine

## 2021-08-17 VITALS — BP 134/84 | HR 60 | Temp 97.9°F | Ht 61.02 in | Wt 225.8 lb

## 2021-08-17 DIAGNOSIS — E785 Hyperlipidemia, unspecified: Secondary | ICD-10-CM | POA: Diagnosis not present

## 2021-08-17 DIAGNOSIS — I2510 Atherosclerotic heart disease of native coronary artery without angina pectoris: Secondary | ICD-10-CM | POA: Diagnosis not present

## 2021-08-17 DIAGNOSIS — E039 Hypothyroidism, unspecified: Secondary | ICD-10-CM | POA: Diagnosis not present

## 2021-08-17 DIAGNOSIS — I2583 Coronary atherosclerosis due to lipid rich plaque: Secondary | ICD-10-CM

## 2021-08-17 MED ORDER — LEVOTHYROXINE SODIUM 125 MCG PO TABS: 125.0000 ug | ORAL_TABLET | Freq: Every day | ORAL | 3 refills | Status: DC

## 2021-08-17 NOTE — Progress Notes (Signed)
? ?BP 134/84   Pulse 60   Temp 97.9 ?F (36.6 ?C) (Oral)   Ht 5' 1.02" (1.55 m)   Wt 225 lb 12.8 oz (102.4 kg)   SpO2 95%   BMI 42.63 kg/m?   ? ?Subjective:  ? ? Patient ID: Annette Porter, female    DOB: [birth year], 71 y.o.   MRN: [id number] ? ?Chief Complaint  ?Patient presents with  ?? Hypothyroidism  ? ? ?HPI: ?Annette Porter is a 71 y.o. female ? ?Pt is here for a fu is getting therpary for back and hip pain and goes about 2-3 times a week fiished up this month.  ? ?Thyroid Problem ?Presents for follow-up (tsh wnl now. tsh now normalized.) visit. Patient reports no anxiety, cold intolerance, constipation, depressed mood, diaphoresis, diarrhea, dry skin, fatigue, hair loss, heat intolerance, hoarse voice, leg swelling, menstrual problem, nail problem, palpitations, tremors, visual change, weight gain or weight loss.  ? ?Chief Complaint  ?Patient presents with  ?? Hypothyroidism  ? ? ?Relevant past medical, surgical, family and social history reviewed and updated as indicated. Interim medical history since our last visit reviewed. ?Allergies and medications reviewed and updated. ? ?Review of Systems  ?Constitutional:  Negative for diaphoresis, fatigue, weight gain and weight loss.  ?HENT:  Negative for hoarse voice.   ?Cardiovascular:  Negative for palpitations.  ?Gastrointestinal:  Negative for constipation and diarrhea.  ?Endocrine: Negative for cold intolerance and heat intolerance.  ?Genitourinary:  Negative for menstrual problem.  ?Neurological:  Negative for tremors.  ?Psychiatric/Behavioral:  The patient is not nervous/anxious.   ? ?Per HPI unless specifically indicated above ? ?   ?Objective:  ?  ?BP 134/84   Pulse 60   Temp 97.9 ?F (36.6 ?C) (Oral)   Ht 5' 1.02" (1.55 m)   Wt 225 lb 12.8 oz (102.4 kg)   SpO2 95%   BMI 42.63 kg/m?   ?Wt Readings from Last 3 Encounters:  ?08/17/21 225 lb 12.8 oz (102.4 kg)  ?06/27/21 224 lb 9.6 oz (101.9 kg)  ?03/30/21 217 lb (98.4 kg)  ?  ?Physical  Exam ?Vitals and nursing note reviewed.  ?Constitutional:   ?   General: She is not in acute distress. ?   Appearance: Normal appearance. She is not ill-appearing or diaphoretic.  ?Eyes:  ?   Conjunctiva/sclera: Conjunctivae normal.  ?Cardiovascular:  ?   Rate and Rhythm: Normal rate and regular rhythm.  ?   Heart sounds: No murmur heard. ?  No friction rub. No gallop.  ?Pulmonary:  ?   Effort: No respiratory distress.  ?   Breath sounds: No stridor. No wheezing or rhonchi.  ?Abdominal:  ?   Palpations: Abdomen is soft.  ?   Tenderness: There is no guarding.  ?Musculoskeletal:     ?   General: No swelling or tenderness.  ?Skin: ?   General: Skin is warm and dry.  ?   Coloration: Skin is not jaundiced.  ?   Findings: No erythema.  ?Neurological:  ?   Mental Status: She is alert.  ?   Cranial Nerves: No cranial nerve deficit.  ?   Sensory: No sensory deficit.  ? ? ?Results for orders placed or performed in visit on 08/13/21  ?TSH  ?Result Value Ref Range  ? TSH 2.150 0.450 - 4.500 uIU/mL  ? ?   ? ? ?Current Outpatient Medications:  ??  aspirin 81 MG tablet, Take 81 mg by mouth daily., Disp: , Rfl:  ??  atorvastatin (LIPITOR) 40 MG tablet, Take 1 tablet (40 mg total) by mouth daily., Disp: 90 tablet, Rfl: 2 ??  Calcium Carbonate (CALCIUM 600 PO), Take 1 tablet by mouth 2 (two) times daily., Disp: , Rfl:  ??  Cholecalciferol (VITAMIN D3) 1000 UNITS CAPS, Take 1 capsule by mouth daily., Disp: , Rfl:  ??  docusate sodium (COLACE) 100 MG capsule, Take 100 mg by mouth daily., Disp: , Rfl:  ??  ezetimibe (ZETIA) 10 MG tablet, Take 1 tablet (10 mg total) by mouth daily., Disp: 90 tablet, Rfl: 2 ??  famotidine (PEPCID) 40 MG tablet, Take 40 mg by mouth daily., Disp: , Rfl:  ??  hydrochlorothiazide (HYDRODIURIL) 25 MG tablet, Take 1 tablet (25 mg total) by mouth daily., Disp: 90 tablet, Rfl: 3 ??  isosorbide mononitrate (IMDUR) 30 MG 24 hr tablet, Take 30 mg by mouth daily., Disp: , Rfl:  ??  isosorbide mononitrate (IMDUR) 60  MG 24 hr tablet, TAKE 1 TABLET BY MOUTH ONCE DAILY, Disp: , Rfl:  ??  metoprolol succinate (TOPROL-XL) 50 MG 24 hr tablet, TAKE 1 TABLET BY MOUTH DAILY  WITH OR IMMEDIATELY FOLLOWING A  MEAL, Disp: 90 tablet, Rfl: 3 ??  montelukast (SINGULAIR) 10 MG tablet, TAKE 1 TABLET BY MOUTH AT  BEDTIME, Disp: 90 tablet, Rfl: 3 ??  nitroGLYCERIN (NITROSTAT) 0.4 MG SL tablet, Place 0.4 mg under the tongue every 5 (five) minutes as needed for chest pain., Disp: , Rfl:  ??  vitamin B-12 (CYANOCOBALAMIN) 250 MCG tablet, Take 500 mcg by mouth daily. , Disp: , Rfl:  ??  levothyroxine (SYNTHROID) 125 MCG tablet, Take 1 tablet (125 mcg total) by mouth daily., Disp: 90 tablet, Rfl: 3  ? ? ?Assessment & Plan:  ?Hypothyroidism  ?Is on 125 mcg of synthroid. ?Chronic, ongoing with recent TSH within normal range.  Continue current medication regimen and adjust as needed.  Plan to recheck TSH at physical in 6 months. ? ?Problem List Items Addressed This Visit   ? ?  ? Cardiovascular and Mediastinum  ? CAD (coronary atherosclerotic disease)  ?  ? Endocrine  ? Hypothyroidism  ? Relevant Medications  ? levothyroxine (SYNTHROID) 125 MCG tablet  ?  ? Other  ? Hyperlipidemia - Primary  ?  ? ?No orders of the defined types were placed in this encounter. ?  ? ?Meds ordered this encounter  ?Medications  ?? levothyroxine (SYNTHROID) 125 MCG tablet  ?  Sig: Take 1 tablet (125 mcg total) by mouth daily.  ?  Dispense:  90 tablet  ?  Refill:  3  ?  ? ?Follow up plan: ?Return in about 6 months (around 02/17/2022). ? ?

## 2021-09-18 ENCOUNTER — Other Ambulatory Visit: Payer: Self-pay | Admitting: Internal Medicine

## 2021-09-18 DIAGNOSIS — E785 Hyperlipidemia, unspecified: Secondary | ICD-10-CM

## 2021-09-18 DIAGNOSIS — I10 Essential (primary) hypertension: Secondary | ICD-10-CM

## 2021-09-18 DIAGNOSIS — I2510 Atherosclerotic heart disease of native coronary artery without angina pectoris: Secondary | ICD-10-CM

## 2021-09-18 MED ORDER — ATORVASTATIN CALCIUM 40 MG PO TABS: 40.0000 mg | ORAL_TABLET | Freq: Every day | ORAL | 0 refills | Status: DC

## 2021-09-18 MED ORDER — LEVOTHYROXINE SODIUM 125 MCG PO TABS: 125.0000 ug | ORAL_TABLET | Freq: Every day | ORAL | 0 refills | Status: DC

## 2021-09-18 MED ORDER — MONTELUKAST SODIUM 10 MG PO TABS: 10.0000 mg | ORAL_TABLET | Freq: Every day | ORAL | 0 refills | Status: DC

## 2021-09-18 MED ORDER — EZETIMIBE 10 MG PO TABS: 10.0000 mg | ORAL_TABLET | Freq: Every day | ORAL | 0 refills | Status: DC

## 2021-09-18 MED ORDER — HYDROCHLOROTHIAZIDE 25 MG PO TABS: 25.0000 mg | ORAL_TABLET | Freq: Every day | ORAL | 0 refills | Status: DC

## 2021-09-18 NOTE — Telephone Encounter (Signed)
Patient request short term Rx for trip- she forgot medications at home. Rx sent per patient request. ?Requested Prescriptions  ?Pending Prescriptions Disp Refills  ?? hydrochlorothiazide (HYDRODIURIL) 25 MG tablet 5 tablet 0  ?  Sig: Take 1 tablet (25 mg total) by mouth daily.  ?  ? Cardiovascular: Diuretics - Thiazide Passed - 09/18/2021  2:37 PM  ?  ?  Passed - Cr in normal range and within 180 days  ?  Creatinine  ?Date Value Ref Range Status  ?04/21/2013 0.81 0.60 - 1.30 mg/dL Final  ? ?Creatinine, Ser  ?Date Value Ref Range Status  ?06/28/2021 0.72 0.57 - 1.00 mg/dL Final  ?   ?  ?  Passed - K in normal range and within 180 days  ?  Potassium  ?Date Value Ref Range Status  ?06/28/2021 4.5 3.5 - 5.2 mmol/L Final  ?04/21/2013 3.7 3.5 - 5.1 mmol/L Final  ?   ?  ?  Passed - Na in normal range and within 180 days  ?  Sodium  ?Date Value Ref Range Status  ?06/28/2021 137 134 - 144 mmol/L Final  ?04/21/2013 141 136 - 145 mmol/L Final  ?   ?  ?  Passed - Last BP in normal range  ?  BP Readings from Last 1 Encounters:  ?08/17/21 134/84  ?   ?  ?  Passed - Valid encounter within last 6 months  ?  Recent Outpatient Visits   ?      ? 1 month ago Hyperlipidemia, unspecified hyperlipidemia type  ? [facility] Vigg, Avanti, MD  ? 2 months ago Primary hypertension  ? [facility] Vigg, Avanti, MD  ? 5 months ago Need for influenza vaccination  ? [facility] Vigg, Avanti, MD  ? 10 months ago Hypothyroidism, unspecified type  ? [facility] Vigg, Avanti, MD  ? 11 months ago Essential hypertension  ? [facility] Vigg, Avanti, MD  ?  ?  ?Future Appointments   ?        ? In 4 months Vigg, Avanti, MD [facility]  ? In 6 months  [place]  ?  ? ?  ?  ?  ?? levothyroxine (SYNTHROID) 125 MCG tablet 5 tablet 0  ?  Sig: Take 1 tablet (125 mcg total) by mouth daily.  ?  ? Endocrinology:  Hypothyroid Agents Passed - 09/18/2021  2:37 PM  ?  ?   Passed - TSH in normal range and within 360 days  ?  TSH  ?Date Value Ref Range Status  ?08/13/2021 2.150 0.450 - 4.500 uIU/mL Final  ?   ?  ?  Passed - Valid encounter within last 12 months  ?  Recent Outpatient Visits   ?      ? 1 month ago Hyperlipidemia, unspecified hyperlipidemia type  ? [facility] Vigg, Avanti, MD  ? 2 months ago Primary hypertension  ? [facility] Vigg, Avanti, MD  ? 5 months ago Need for influenza vaccination  ? [facility] Vigg, Avanti, MD  ? 10 months ago Hypothyroidism, unspecified type  ? [facility] Vigg, Avanti, MD  ? 11 months ago Essential hypertension  ? [facility] Vigg, Avanti, MD  ?  ?  ?Future Appointments   ?        ? In 4 months Vigg, Avanti, MD [facility]  ? In 6 months  [facility],  [facility]  ?  ? ?  ?  ?  ?? atorvastatin (LIPITOR) 40 MG tablet 5 tablet 0  ?  Sig: Take 1 tablet (40 mg total) by mouth daily.  ?  ? Cardiovascular:  Antilipid - Statins Failed - 09/18/2021  2:37 PM  ?  ?  Failed - Lipid Panel in normal range within the last 12 months  ?  Cholesterol, Total  ?Date Value Ref Range Status  ?06/28/2021 142 100 - 199 mg/dL Final  ? ?Cholesterol Piccolo, [place]  ?Date Value Ref Range Status  ?08/17/2018 133 <200 mg/dL Final  ?  Comment:  ?                          Desirable                <200 ?                        Borderline High      200- 239 ?                        High                     >239 ?  ? ?LDL Chol Calc (NIH)  ?Date Value Ref Range Status  ?06/28/2021 69 0 - 99 mg/dL Final  ? ?HDL  ?Date Value Ref Range Status  ?06/28/2021 52 >39 mg/dL Final  ? ?Triglycerides  ?Date Value Ref Range Status  ?06/28/2021 114 0 - 149 mg/dL Final  ? ?Triglycerides Piccolo,Waived  ?Date Value Ref Range Status  ?08/17/2018 135 <150 mg/dL Final  ?  Comment:  ?                          Normal                   <150 ?                        Borderline High     150 - 199 ?                         High                200 - 499 ?                        Very High                >499 ?  ? ?  ?  ?  Passed - Patient is not pregnant  ?  ?  Passed - Valid encounter within last 12 months  ?  Recent Outpatient Visits   ?      ? 1 month ago Hyperlipidemia, unspecified hyperlipidemia type  ? [facility] Vigg, Avanti, MD  ? 2 months ago Primary hypertension  ? [facility] Vigg, Avanti, MD  ? 5 months ago Need for influenza vaccination  ? [facility] Vigg, Avanti, MD  ? 10 months ago Hypothyroidism, unspecified type  ? [facility] Vigg, Avanti, MD  ? 11 months ago Essential hypertension  ? [facility] Vigg, Avanti, MD  ?  ?  ?Future Appointments   ?        ?  In 4 months Vigg, Avanti, MD [facility]  ? In 6 months  [place], [facility]  ?  ? ?  ?  ?  ?? ezetimibe (ZETIA) 10 MG tablet 5 tablet 0  ?  Sig: Take 1 tablet (10 mg total) by mouth daily.  ?  ? Cardiovascular:  Antilipid - Sterol Transport Inhibitors Failed - 09/18/2021  2:37 PM  ?  ?  Failed - ALT in normal range and within 360 days  ?  ALT  ?Date Value Ref Range Status  ?06/28/2021 35 (H) 0 - 32 IU/L Final  ? ?ALT (SGPT) Piccolo, Waived  ?Date Value Ref Range Status  ?08/17/2018 25 10 - 47 U/L Final  ?   ?  ?  Failed - Lipid Panel in normal range within the last 12 months  ?  Cholesterol, Total  ?Date Value Ref Range Status  ?06/28/2021 142 100 - 199 mg/dL Final  ? ?Cholesterol Piccolo, [place]  ?Date Value Ref Range Status  ?08/17/2018 133 <200 mg/dL Final  ?  Comment:  ?                          Desirable                <200 ?                        Borderline High      200- 239 ?                        High                     >239 ?  ? ?LDL Chol Calc (NIH)  ?Date Value Ref Range Status  ?06/28/2021 69 0 - 99 mg/dL Final  ? ?HDL  ?Date Value Ref Range Status  ?06/28/2021 52 >39 mg/dL Final  ? ?Triglycerides  ?Date Value Ref Range Status  ?06/28/2021 114 0 - 149  mg/dL Final  ? ?Triglycerides Piccolo,Waived  ?Date Value Ref Range Status  ?08/17/2018 135 <150 mg/dL Final  ?  Comment:  ?                          Normal                   <150 ?                        Borderline High     150 - 199 ?                        High                200 - 499 ?                        Very High                >499 ?  ? ?  ?  ?  Passed - AST in normal range and within 360 days  ?  AST  ?Date Value Ref Range Status  ?06/28/2021 29 0 - 40 IU/L Final  ? ?AST (SGOT) Piccolo, Waived  ?Date Value Ref Range Status  ?08/17/2018 28 11 - 38  U/L Final  ?   ?  ?  Passed - Patient is not pregnant  ?  ?  Passed - Valid encounter within last 12 months  ?  Recent Outpatient Visits   ?      ? 1 month ago Hyperlipidemia, unspecified hyperlipidemia type  ? [facility] Vigg, Avanti, MD  ? 2 months ago Primary hypertension  ? [facility] Vigg, Avanti, MD  ? 5 months ago Need for influenza vaccination  ? [facility] Vigg, Avanti, MD  ? 10 months ago Hypothyroidism, unspecified type  ? [facility] Vigg, Avanti, MD  ? 11 months ago Essential hypertension  ? [facility] Vigg, Avanti, MD  ?  ?  ?Future Appointments   ?        ? In 4 months Vigg, Avanti, MD [facility]  ? In 6 months  [place], [facility]  ?  ? ?  ?  ?  ?? montelukast (SINGULAIR) 10 MG tablet 5 tablet 0  ?  Sig: Take 1 tablet (10 mg total) by mouth at bedtime.  ?  ? Pulmonology:  Leukotriene Inhibitors Passed - 09/18/2021  2:37 PM  ?  ?  Passed - Valid encounter within last 12 months  ?  Recent Outpatient Visits   ?      ? 1 month ago Hyperlipidemia, unspecified hyperlipidemia type  ? [facility] Vigg, Avanti, MD  ? 2 months ago Primary hypertension  ? [facility] Vigg, Avanti, MD  ? 5 months ago Need for influenza vaccination  ? [facility] Vigg, Avanti, MD  ? 10 months ago Hypothyroidism, unspecified type  ? [facility] Vigg, Avanti, MD  ? 11 months ago Essential hypertension  ? [facility] Vigg, Avanti, MD  ?  ?  ?Future Appointments   ?        ? In 4 months Vigg, Avanti, MD [facility]

## 2021-09-18 NOTE — Telephone Encounter (Signed)
Medication Refill - Medication: hydrochlorothiazide (HYDRODIURIL) 25 MG tablet,levothyroxine (SYNTHROID) 125 MCG tablet ?atorvastatin (LIPITOR) 40 MG tablet,ezetimibe (ZETIA) 10 MG tablet, montelukast (SINGULAIR) 10 MG tablet ? ?5-day supply of medication forgot medications at home pt is currently on vacation. Pt is asking to please refill as soon as possible she has no medication.  ? ? ?Has the patient contacted their pharmacy? No. Pt forgot medication at home.  ? ?(Agent: If no, request that the patient contact the pharmacy for the refill. If patient does not wish to contact the pharmacy document the reason why and proceed with request.) ? ?Preferred Pharmacy (with phone number or street name):  ?[facility]  ?[place] [address]  ?Phone: [phone] Fax: [phone]  ?Hours: Not open 24 hours  ? ?Has the patient been seen for an appointment in the last year OR does the patient have an upcoming appointment? Yes.   ? ?Agent: Please be advised that RX refills may take up to 3 business days. We ask that you follow-up with your pharmacy.  ?

## 2021-09-23 ENCOUNTER — Other Ambulatory Visit: Payer: Self-pay | Admitting: Internal Medicine

## 2021-09-23 DIAGNOSIS — I2510 Atherosclerotic heart disease of native coronary artery without angina pectoris: Secondary | ICD-10-CM

## 2021-09-23 DIAGNOSIS — E785 Hyperlipidemia, unspecified: Secondary | ICD-10-CM

## 2021-09-24 NOTE — Telephone Encounter (Signed)
Refilled 03/05/2021 #90 2 refills. ?Requested Prescriptions  ?Pending Prescriptions Disp Refills  ?? ezetimibe (ZETIA) 10 MG tablet [Pharmacy Med Name: Ezetimibe 10 MG Oral Tablet] 90 tablet 3  ?  Sig: TAKE 1 TABLET BY MOUTH  DAILY  ?  ? Cardiovascular:  Antilipid - Sterol Transport Inhibitors Failed - 09/23/2021 10:40 PM  ?  ?  Failed - ALT in normal range and within 360 days  ?  ALT  ?Date Value Ref Range Status  ?06/28/2021 35 (H) 0 - 32 IU/L Final  ? ?ALT (SGPT) Piccolo, Waived  ?Date Value Ref Range Status  ?08/17/2018 25 10 - 47 U/L Final  ?   ?  ?  Failed - Lipid Panel in normal range within the last 12 months  ?  Cholesterol, Total  ?Date Value Ref Range Status  ?06/28/2021 142 100 - 199 mg/dL Final  ? ?Cholesterol Piccolo, [place]  ?Date Value Ref Range Status  ?08/17/2018 133 <200 mg/dL Final  ?  Comment:  ?                          Desirable                <200 ?                        Borderline High      200- 239 ?                        High                     >239 ?  ? ?LDL Chol Calc (NIH)  ?Date Value Ref Range Status  ?06/28/2021 69 0 - 99 mg/dL Final  ? ?HDL  ?Date Value Ref Range Status  ?06/28/2021 52 >39 mg/dL Final  ? ?Triglycerides  ?Date Value Ref Range Status  ?06/28/2021 114 0 - 149 mg/dL Final  ? ?Triglycerides Piccolo,Waived  ?Date Value Ref Range Status  ?08/17/2018 135 <150 mg/dL Final  ?  Comment:  ?                          Normal                   <150 ?                        Borderline High     150 - 199 ?                        High                200 - 499 ?                        Very High                >499 ?  ? ?  ?  ?  Passed - AST in normal range and within 360 days  ?  AST  ?Date Value Ref Range Status  ?06/28/2021 29 0 - 40 IU/L Final  ? ?AST (SGOT) Piccolo, Waived  ?Date Value Ref Range Status  ?08/17/2018 28 11 - 38 U/L Final  ?   ?  ?  Passed - Patient  is not pregnant  ?  ?  Passed - Valid encounter within last 12 months  ?  Recent Outpatient Visits   ?      ? 1 month  ago Hyperlipidemia, unspecified hyperlipidemia type  ? [facility] Vigg, Avanti, MD  ? 2 months ago Primary hypertension  ? [facility] Vigg, Avanti, MD  ? 6 months ago Need for influenza vaccination  ? [facility] Vigg, Avanti, MD  ? 10 months ago Hypothyroidism, unspecified type  ? [facility] Vigg, Avanti, MD  ? 11 months ago Essential hypertension  ? [facility] Vigg, Avanti, MD  ?  ?  ?Future Appointments   ?        ? In 4 months Vigg, Avanti, MD [facility]  ? In 6 months  [place], [facility]  ?  ? ?  ?  ?  ? ?

## 2021-10-05 ENCOUNTER — Other Ambulatory Visit: Payer: Self-pay | Admitting: Internal Medicine

## 2021-10-05 DIAGNOSIS — E785 Hyperlipidemia, unspecified: Secondary | ICD-10-CM

## 2021-10-05 NOTE — Telephone Encounter (Signed)
Requested medication (s) are due for refill today: yes ? ?Requested medication (s) are on the active medication list: yes ? ?Last refill:  09/18/21 #5 0 refills ? ?Future visit scheduled: yes in 4 months ? ?Notes to clinic:  requesting 1 year supply . Do you want to refill/renew Rx? ? ? ?  ?Requested Prescriptions  ?Pending Prescriptions Disp Refills  ? atorvastatin (LIPITOR) 40 MG tablet [Pharmacy Med Name: Atorvastatin Calcium 40 MG Oral Tablet] 90 tablet 3  ?  Sig: TAKE 1 TABLET BY MOUTH  DAILY  ?  ? Cardiovascular:  Antilipid - Statins Failed - 10/05/2021  7:05 AM  ?  ?  Failed - Lipid Panel in normal range within the last 12 months  ?  Cholesterol, Total  ?Date Value Ref Range Status  ?06/28/2021 142 100 - 199 mg/dL Final  ? ?Cholesterol Piccolo, [place]  ?Date Value Ref Range Status  ?08/17/2018 133 <200 mg/dL Final  ?  Comment:  ?                          Desirable                <200 ?                        Borderline High      200- 239 ?                        High                     >239 ?  ? ?LDL Chol Calc (NIH)  ?Date Value Ref Range Status  ?06/28/2021 69 0 - 99 mg/dL Final  ? ?HDL  ?Date Value Ref Range Status  ?06/28/2021 52 >39 mg/dL Final  ? ?Triglycerides  ?Date Value Ref Range Status  ?06/28/2021 114 0 - 149 mg/dL Final  ? ?Triglycerides Piccolo,Waived  ?Date Value Ref Range Status  ?08/17/2018 135 <150 mg/dL Final  ?  Comment:  ?                          Normal                   <150 ?                        Borderline High     150 - 199 ?                        High                200 - 499 ?                        Very High                >499 ?  ? ?  ?  ?  Passed - Patient is not pregnant  ?  ?  Passed - Valid encounter within last 12 months  ?  Recent Outpatient Visits   ? ?      ? 1 month ago Hyperlipidemia, unspecified hyperlipidemia type  ? [facility] Vigg, Avanti, MD  ? 3 months ago Primary hypertension  ? [facility] Vigg, Avanti, MD  ?  6 months ago Need for  influenza vaccination  ? [facility] Vigg, Avanti, MD  ? 10 months ago Hypothyroidism, unspecified type  ? [facility] Vigg, Avanti, MD  ? 1 year ago Essential hypertension  ? [facility] Vigg, Avanti, MD  ? ?  ?  ?Future Appointments   ? ?        ? In 4 months Vigg, Avanti, MD [facility]  ? In 5 months  [place], [facility]  ? ?  ? ? ?  ?  ?  ? ?

## 2021-12-25 ENCOUNTER — Ambulatory Visit: Payer: Medicare Other | Admitting: Internal Medicine

## 2022-01-03 DIAGNOSIS — M25561 Pain in right knee: Secondary | ICD-10-CM | POA: Diagnosis not present

## 2022-01-16 DIAGNOSIS — E119 Type 2 diabetes mellitus without complications: Secondary | ICD-10-CM | POA: Diagnosis not present

## 2022-01-16 DIAGNOSIS — I10 Essential (primary) hypertension: Secondary | ICD-10-CM | POA: Diagnosis not present

## 2022-01-16 DIAGNOSIS — E782 Mixed hyperlipidemia: Secondary | ICD-10-CM | POA: Diagnosis not present

## 2022-01-16 DIAGNOSIS — I25118 Atherosclerotic heart disease of native coronary artery with other forms of angina pectoris: Secondary | ICD-10-CM | POA: Diagnosis not present

## 2022-01-22 DIAGNOSIS — M25561 Pain in right knee: Secondary | ICD-10-CM | POA: Diagnosis not present

## 2022-01-29 DIAGNOSIS — M25561 Pain in right knee: Secondary | ICD-10-CM | POA: Diagnosis not present

## 2022-01-31 DIAGNOSIS — M25561 Pain in right knee: Secondary | ICD-10-CM | POA: Diagnosis not present

## 2022-02-05 DIAGNOSIS — M25561 Pain in right knee: Secondary | ICD-10-CM | POA: Diagnosis not present

## 2022-02-05 NOTE — Progress Notes (Unsigned)
Established Patient Office Visit  Name: Annette Porter   MRN: [id number]    DOB: 1950/08/30   Date:02/06/2022  Today's Provider: Talitha Givens, MHS, PA-C Introduced myself to the patient as a PA-C and provided education on APPs in clinical practice.         Subjective  Chief Complaint  Chief Complaint  Patient presents with   Hypertension   Hyperlipidemia   Coronary Artery Disease   Hypothyroidism    HPI  HYPERTENSION / HYPERLIPIDEMIA Satisfied with current treatment? yes Duration of hypertension: chronic BP monitoring frequency: a few times a week BP range: Normally runs around 120s/80s  BP medication side effects: no  BP meds: HCTZ 25 mg, metoprolol 50 mg PO QD  Duration of hyperlipidemia: chronic Cholesterol medication side effects: no Cholesterol supplements: none Past cholesterol medications: atorvastain (lipitor) and ezetimide (zetia) Medication compliance: excellent compliance Aspirin: yes Recent stressors: no Recurrent headaches: no Visual changes: no Palpitations: no Dyspnea: no Chest pain: no Lower extremity edema: no Dizzy/lightheaded: no  GERD GERD control status: stable Satisfied with current treatment? yes Heartburn frequency: a few times per week, depends on intake of trigger foods  Medication side effects: no  Medication compliance: stable GERD medications:Pepcid, alkaseltzer PRN Antacid use frequency:  a few times per week Duration:  Nature:  Location:  Heartburn duration:  Alleviatiating factors:  Alkaseltzer  Aggravating factors:  Dysphagia: no Odynophagia:  yes- sometimes  Hematemesis: no Blood in stool: no EGD: yes  HYPOTHYROIDISM Thyroid control status:stable Satisfied with current treatment? yes Medication side effects: no Medication compliance: excellent compliance Etiology of hypothyroidism: appears genetic,  Recent dose adjustment:no Fatigue: no Cold intolerance: no Heat intolerance: no Weight gain:  yes Weight loss: no Constipation: no Diarrhea/loose stools: no Palpitations: no Lower extremity edema: no Anxiety/depressed mood: no   Patient Active Problem List   Diagnosis Date Noted   Need for influenza vaccination 03/27/2021   Left ankle pain 03/27/2021   Lumbar spine pain 03/27/2021   History of 2019 novel coronavirus disease (COVID-19) 03/27/2020   Morbid obesity ([place]) 08/17/2018   Advanced care planning/counseling discussion 01/29/2017   GERD (gastroesophageal reflux disease) 01/10/2016   Hypothyroidism 07/12/2015   Hypertension 12/21/2014   Atrophic vaginitis 12/21/2014   Hyperlipidemia 03/23/2014   CAD (coronary atherosclerotic disease) 03/23/2014   History of breast cancer 11/09/2013   History of MI (myocardial infarction) 07/11/1998    Past Surgical History:  Procedure Laterality Date   BREAST BIOPSY Left 03/22/13    DCIS at 3:00   BREAST BIOPSY Left 11/11/13   INVASIVE MAMMARY CARCINOMA at 8:00   BREAST LUMPECTOMY Left 04/29/2013   High grade DCIS, 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   BREAST LUMPECTOMY Left 12/16/2013   IMC 11 mm ER/ PR positive, Her 2 neu negative, T1c, N0. Mammosite radiation.    BREAST LUMPECTOMY WITH MAMMOSITE CAVITY EVALUATION DEVICE Left 2014   BREAST LUMPECTOMY WITH MAMMOSITE CAVITY EVALUATION DEVICE Left 2015   BREAST MAMMOSITE  05/20/13   BREAST SURGERY Left 04/29/13   wide excision   BREAST SURGERY Left 12-16-13   Wide excision with SN biopsy, INVASIVE MAMMARY CARCINOMA   CARDIAC CATHETERIZATION     COLONOSCOPY  2006   COLONOSCOPY WITH PROPOFOL N/A 05/31/2015   Procedure: COLONOSCOPY WITH PROPOFOL;  Surgeon: Robert Bellow, MD;  Location: [facility] ENDOSCOPY;  Service: Endoscopy;  Laterality: N/A;   HAND SURGERY     KNEE ARTHROSCOPY  WITH MEDIAL MENISECTOMY Left 02/24/2015   Procedure: KNEE ARTHROSCOPY WITH PARTIAL MEDIAL MENISECTOMY;  Surgeon: Christophe Louis, MD;  Location: [facility] ORS;   Service: Orthopedics;  Laterality: Left;    Family History  Problem Relation Age of Onset   Brain cancer Father    Thyroid disease Sister    Diabetes Maternal Uncle    Thyroid disease Paternal Aunt    Thyroid disease Sister    Thyroid disease Sister    Breast cancer Neg Hx     Social History   Tobacco Use   Smoking status: Never   Smokeless tobacco: Never  Substance Use Topics   Alcohol use: No     Current Outpatient Medications:    aspirin 81 MG tablet, Take 81 mg by mouth daily., Disp: , Rfl:    atorvastatin (LIPITOR) 40 MG tablet, TAKE 1 TABLET BY MOUTH  DAILY, Disp: 90 tablet, Rfl: 1   Calcium Carbonate (CALCIUM 600 PO), Take 1 tablet by mouth 2 (two) times daily., Disp: , Rfl:    Cholecalciferol (VITAMIN D3) 1000 UNITS CAPS, Take 1 capsule by mouth daily., Disp: , Rfl:    docusate sodium (COLACE) 100 MG capsule, Take 100 mg by mouth daily., Disp: , Rfl:    ezetimibe (ZETIA) 10 MG tablet, Take 1 tablet (10 mg total) by mouth daily., Disp: 5 tablet, Rfl: 0   famotidine (PEPCID) 40 MG tablet, Take 40 mg by mouth daily., Disp: , Rfl:    hydrochlorothiazide (HYDRODIURIL) 25 MG tablet, Take 1 tablet (25 mg total) by mouth daily., Disp: 5 tablet, Rfl: 0   isosorbide mononitrate (IMDUR) 30 MG 24 hr tablet, Take 30 mg by mouth daily., Disp: , Rfl:    isosorbide mononitrate (IMDUR) 60 MG 24 hr tablet, TAKE 1 TABLET BY MOUTH ONCE DAILY, Disp: , Rfl:    levothyroxine (SYNTHROID) 125 MCG tablet, Take 1 tablet (125 mcg total) by mouth daily., Disp: 5 tablet, Rfl: 0   methocarbamol (ROBAXIN) 500 MG tablet, 1 tab PO QHS PRN Pain, Disp: , Rfl:    metoprolol succinate (TOPROL-XL) 50 MG 24 hr tablet, TAKE 1 TABLET BY MOUTH DAILY  WITH OR IMMEDIATELY FOLLOWING A  MEAL, Disp: 90 tablet, Rfl: 3   montelukast (SINGULAIR) 10 MG tablet, Take 1 tablet (10 mg total) by mouth at bedtime., Disp: 5 tablet, Rfl: 0   nitroGLYCERIN (NITROSTAT) 0.4 MG SL tablet, Place 0.4 mg under the tongue every 5 (five)  minutes as needed for chest pain., Disp: , Rfl:    vitamin B-12 (CYANOCOBALAMIN) 250 MCG tablet, Take 500 mcg by mouth daily. , Disp: , Rfl:    meloxicam (MOBIC) 15 MG tablet, Take 15 mg by mouth daily., Disp: , Rfl:   Allergies  Allergen Reactions   Benazepril Cough   Dilaudid [Hydromorphone] Nausea And Vomiting   Hydrocodone Itching   Tape Other (See Comments)    irratation    I personally reviewed active problem list, medication list, allergies, health maintenance, notes from last encounter, lab results with the patient/caregiver today.   Review of Systems  Eyes:  Negative for blurred vision and double vision.  Respiratory:  Negative for shortness of breath and wheezing.   Cardiovascular:  Negative for chest pain, palpitations and leg swelling.  Gastrointestinal:  Positive for heartburn. Negative for abdominal pain, constipation, diarrhea, nausea and vomiting.  Neurological:  Negative for dizziness, tingling and headaches.  Psychiatric/Behavioral:  Negative for depression. The patient is not nervous/anxious.       Objective  Vitals:  02/06/22 0808  BP: 137/80  Pulse: (!) 56  Temp: 98.6 F (37 C)  TempSrc: Oral  SpO2: 98%  Weight: 225 lb 9.6 oz (102.3 kg)  Height: 5' 1.02" (1.55 m)    Body mass index is 42.59 kg/m.  Physical Exam Vitals reviewed.  Constitutional:      General: She is awake.     Appearance: Normal appearance. She is well-developed and well-groomed.  Eyes:     General: Lids are normal. Gaze aligned appropriately. No visual field deficit.    Pupils: Pupils are equal, round, and reactive to light.  Cardiovascular:     Rate and Rhythm: Normal rate and regular rhythm.     Pulses: Normal pulses.          Radial pulses are 2+ on the right side and 2+ on the left side.     Heart sounds: Normal heart sounds. No murmur heard.    No friction rub. No gallop.  Pulmonary:     Effort: Pulmonary effort is normal.     Breath sounds: Normal breath sounds.  No decreased air movement. No decreased breath sounds, wheezing, rhonchi or rales.  Musculoskeletal:     Cervical back: Normal range of motion.     Right lower leg: No edema.     Left lower leg: No edema.  Neurological:     General: No focal deficit present.     Mental Status: She is alert and oriented to person, place, and time.     GCS: GCS eye subscore is 4. GCS verbal subscore is 5. GCS motor subscore is 6.     Cranial Nerves: Cranial nerves 2-12 are intact. No cranial nerve deficit, dysarthria or facial asymmetry.     Motor: No weakness, tremor, atrophy or abnormal muscle tone.  Psychiatric:        Attention and Perception: Attention and perception normal.        Mood and Affect: Mood and affect normal.        Speech: Speech normal.        Behavior: Behavior normal. Behavior is cooperative.      No results found for this or any previous visit (from the past 2160 hour(s)).   PHQ2/9:    02/06/2022    8:20 AM 08/17/2021    8:32 AM 06/27/2021   11:13 AM 03/30/2021    8:19 AM 11/20/2020    8:23 AM  Depression screen PHQ 2/9  Decreased Interest 0 0 0 0 0  Down, Depressed, Hopeless 0 0 0 0 0  PHQ - 2 Score 0 0 0 0 0  Altered sleeping 0 0 0    Tired, decreased energy 0 0 0    Change in appetite 0 0 0    Feeling bad or failure about yourself  0 0 0    Trouble concentrating 0 0 0    Moving slowly or fidgety/restless 0 0 0    Suicidal thoughts 0 0 0    PHQ-9 Score 0 0 0    Difficult doing work/chores Not difficult at all Not difficult at all Not difficult at all        Fall Risk:    02/06/2022    8:20 AM 08/17/2021    8:32 AM 06/27/2021   11:13 AM 03/30/2021    8:18 AM 03/27/2021    8:26 AM  Fall Risk   Falls in the past year? 0 0 0 0 0  Number falls in past yr: 0 0 0  0  Injury with Fall? 0 0 0  0  Risk for fall due to : No Fall Risks No Fall Risks No Fall Risks Medication side effect No Fall Risks  Follow up Falls evaluation completed Falls evaluation completed Falls  evaluation completed Falls evaluation completed;Education provided;Falls prevention discussed Falls evaluation completed      Functional Status Survey:      Assessment & Plan  Problem List Items Addressed This Visit       Cardiovascular and Mediastinum   Hypertension - Primary    Chronic, historic condition Reports she feels her BP is well controlled and results at home are usually in goal  Currently taking metoporlol 50 mg PO QD and HCTZ 25 mg PO QD - appears to be tolerating well Continue current medications and lifestyle management Follow up in 6 months for monitoring      Relevant Orders   Comp Met (CMET)   CBC w/Diff     Digestive   GERD (gastroesophageal reflux disease)    Chronic, historic condition Reports this is well managed with Pepcid and Alkaseltzer PRN for exacerbations Reviewed avoiding trigger foods  Recommend she continue with Pepcid as directed Follow up as needed         Endocrine   Hypothyroidism    Chronic, historic condition, ongoing Currently taking levothyroxine 125 mcg PO QD and appears to be tolerating well- continue pending labs  Will recheck TSH today - results to dictate further management/ dose adjustment Follow up in 6 months or earlier if concerns arise      Relevant Orders   TSH   Comp Met (CMET)     Other   Hyperlipidemia    Chronic, historic condition Reports she is taking Zetia 10 mg PO QD and Atorvastatin 40 mg PO QD - appears to be tolerating well Continue current medications- encouraged lifestyle modifications to assist with further management Follow up in 6 months for monitoring       Relevant Orders   Lipid Profile   HgB A1c   Morbid obesity ([place])    Chronic, ongoing BMI is 42.59 today  Reviewed previous labs- A1c two years ago was 6.1 but has not been recheck since then Discussed with patient that this could indicate a hx of prediabetes Will repeat A1c today along with Lipid panel  Recommend she continue  with her diet and exercise efforts as tolerated  Follow up as needed       Relevant Orders   Lipid Profile   HgB A1c   Other Visit Diagnoses     History of prediabetes     Unsure of chronicity  Most recent A58c is two years old Reviewed previous labs- A1c two years ago was 6.1 but has not been recheck since then Discussed with patient that this could indicate a hx of prediabetes Will repeat A1c today for monitoring and update problem list as appropriate.     Prediabetes     See hx of prediabetes above for A&P   Relevant Orders   HgB A1c        Return in about 6 months (around 08/08/2022) for HTN, HLD, hypothyroidism.   I, Ferman Basilio E Eyad Rochford, PA-C, have reviewed all documentation for this visit. The documentation on 02/06/22 for the exam, diagnosis, procedures, and orders are all accurate and complete.   Talitha Givens, MHS, PA-C [facility]

## 2022-02-06 ENCOUNTER — Ambulatory Visit (INDEPENDENT_AMBULATORY_CARE_PROVIDER_SITE_OTHER): Payer: Medicare Other | Admitting: Physician Assistant

## 2022-02-06 ENCOUNTER — Encounter: Payer: Self-pay | Admitting: Physician Assistant

## 2022-02-06 VITALS — BP 137/80 | HR 56 | Temp 98.6°F | Ht 61.02 in | Wt 225.6 lb

## 2022-02-06 DIAGNOSIS — R7303 Prediabetes: Secondary | ICD-10-CM | POA: Diagnosis not present

## 2022-02-06 DIAGNOSIS — Z87898 Personal history of other specified conditions: Secondary | ICD-10-CM

## 2022-02-06 DIAGNOSIS — I10 Essential (primary) hypertension: Secondary | ICD-10-CM

## 2022-02-06 DIAGNOSIS — E039 Hypothyroidism, unspecified: Secondary | ICD-10-CM

## 2022-02-06 DIAGNOSIS — E785 Hyperlipidemia, unspecified: Secondary | ICD-10-CM | POA: Diagnosis not present

## 2022-02-06 DIAGNOSIS — K219 Gastro-esophageal reflux disease without esophagitis: Secondary | ICD-10-CM

## 2022-02-06 NOTE — Assessment & Plan Note (Signed)
Chronic, ongoing BMI is 42.59 today  Reviewed previous labs- A1c two years ago was 6.1 but has not been recheck since then Discussed with patient that this could indicate a hx of prediabetes Will repeat A1c today along with Lipid panel  Recommend she continue with her diet and exercise efforts as tolerated  Follow up as needed

## 2022-02-06 NOTE — Assessment & Plan Note (Signed)
Chronic, historic condition Reports this is well managed with Pepcid and Alkaseltzer PRN for exacerbations Reviewed avoiding trigger foods  Recommend she continue with Pepcid as directed Follow up as needed

## 2022-02-06 NOTE — Assessment & Plan Note (Addendum)
Chronic, historic condition Reports she feels her BP is well controlled and results at home are usually in goal  Currently taking metoporlol 50 mg PO QD and HCTZ 25 mg PO QD - appears to be tolerating well Continue current medications and lifestyle management Follow up in 6 months for monitoring

## 2022-02-06 NOTE — Assessment & Plan Note (Signed)
Chronic, historic condition Reports she is taking Zetia 10 mg PO QD and Atorvastatin 40 mg PO QD - appears to be tolerating well Continue current medications- encouraged lifestyle modifications to assist with further management Follow up in 6 months for monitoring

## 2022-02-06 NOTE — Assessment & Plan Note (Signed)
Chronic, historic condition, ongoing Currently taking levothyroxine 125 mcg PO QD and appears to be tolerating well- continue pending labs  Will recheck TSH today - results to dictate further management/ dose adjustment Follow up in 6 months or earlier if concerns arise

## 2022-02-07 DIAGNOSIS — M25561 Pain in right knee: Secondary | ICD-10-CM | POA: Diagnosis not present

## 2022-02-07 LAB — COMPREHENSIVE METABOLIC PANEL
ALT: 27 IU/L (ref 0–32)
AST: 24 IU/L (ref 0–40)
Albumin/Globulin Ratio: 1.4 (ref 1.2–2.2)
Albumin: 4.2 g/dL (ref 3.8–4.8)
Alkaline Phosphatase: 88 IU/L (ref 44–121)
BUN/Creatinine Ratio: 13 (ref 12–28)
BUN: 10 mg/dL (ref 8–27)
Bilirubin Total: 0.3 mg/dL (ref 0.0–1.2)
CO2: 20 mmol/L (ref 20–29)
Calcium: 9.7 mg/dL (ref 8.7–10.3)
Chloride: 97 mmol/L (ref 96–106)
Creatinine, Ser: 0.79 mg/dL (ref 0.57–1.00)
Globulin, Total: 3 g/dL (ref 1.5–4.5)
Glucose: 102 mg/dL — ABNORMAL HIGH (ref 70–99)
Potassium: 4.8 mmol/L (ref 3.5–5.2)
Sodium: 133 mmol/L — ABNORMAL LOW (ref 134–144)
Total Protein: 7.2 g/dL (ref 6.0–8.5)
eGFR: 79 mL/min/{1.73_m2} (ref >59)

## 2022-02-07 LAB — LIPID PANEL
Chol/HDL Ratio: 2.8 ratio (ref 0.0–4.4)
Cholesterol, Total: 141 mg/dL (ref 100–199)
HDL: 50 mg/dL (ref >39)
LDL Chol Calc (NIH): 69 mg/dL (ref 0–99)
Triglycerides: 122 mg/dL (ref 0–149)
VLDL Cholesterol Cal: 22 mg/dL (ref 5–40)

## 2022-02-07 LAB — CBC WITH DIFFERENTIAL/PLATELET
Basophils Absolute: 0.1 10*3/uL (ref 0.0–0.2)
Basos: 1 %
EOS (ABSOLUTE): 0.2 10*3/uL (ref 0.0–0.4)
Eos: 2 %
Hematocrit: 44.3 % (ref 34.0–46.6)
Hemoglobin: 15.2 g/dL (ref 11.1–15.9)
Immature Grans (Abs): 0 10*3/uL (ref 0.0–0.1)
Immature Granulocytes: 0 %
Lymphocytes Absolute: 2.1 10*3/uL (ref 0.7–3.1)
Lymphs: 22 %
MCH: 30.8 pg (ref 26.6–33.0)
MCHC: 34.3 g/dL (ref 31.5–35.7)
MCV: 90 fL (ref 79–97)
Monocytes Absolute: 0.9 10*3/uL (ref 0.1–0.9)
Monocytes: 10 %
Neutrophils Absolute: 6.2 10*3/uL (ref 1.4–7.0)
Neutrophils: 65 %
Platelets: 265 10*3/uL (ref 150–450)
RBC: 4.94 x10E6/uL (ref 3.77–5.28)
RDW: 12.5 % (ref 11.7–15.4)
WBC: 9.4 10*3/uL (ref 3.4–10.8)

## 2022-02-07 LAB — HEMOGLOBIN A1C
Est. average glucose Bld gHb Est-mCnc: 134 mg/dL
Hgb A1c MFr Bld: 6.3 % — ABNORMAL HIGH (ref 4.8–5.6)

## 2022-02-07 LAB — TSH: TSH: 1.26 u[IU]/mL (ref 0.450–4.500)

## 2022-02-14 ENCOUNTER — Other Ambulatory Visit: Payer: Self-pay | Admitting: Internal Medicine

## 2022-02-14 ENCOUNTER — Other Ambulatory Visit: Payer: Self-pay | Admitting: Physician Assistant

## 2022-02-14 ENCOUNTER — Telehealth: Payer: Self-pay | Admitting: Internal Medicine

## 2022-02-14 DIAGNOSIS — Z1231 Encounter for screening mammogram for malignant neoplasm of breast: Secondary | ICD-10-CM

## 2022-02-14 NOTE — Telephone Encounter (Signed)
Order placed for patient's mammogram. Called and notified for that order has been placed.

## 2022-02-14 NOTE — Telephone Encounter (Signed)
Pt is calling to request an order for a mamogram to be send to [facility]. [place]- [phone]

## 2022-02-20 IMAGING — MG MM DIGITAL SCREENING BILAT W/ TOMO AND CAD
6 of 10 series · 6 of 30 positions shown · non-contrast
Comparison: Previous exam(s).

CLINICAL DATA: Screening. History of 2 LEFT lumpectomies.

EXAM:
DIGITAL SCREENING BILATERAL MAMMOGRAM WITH TOMOSYNTHESIS AND CAD
TECHNIQUE: Bilateral screening digital craniocaudal and mediolateral oblique
mammograms were obtained. Bilateral screening digital breast
tomosynthesis was performed. The images were evaluated with
computer-aided detection.

[R CC synth-2D (1 of 2)]
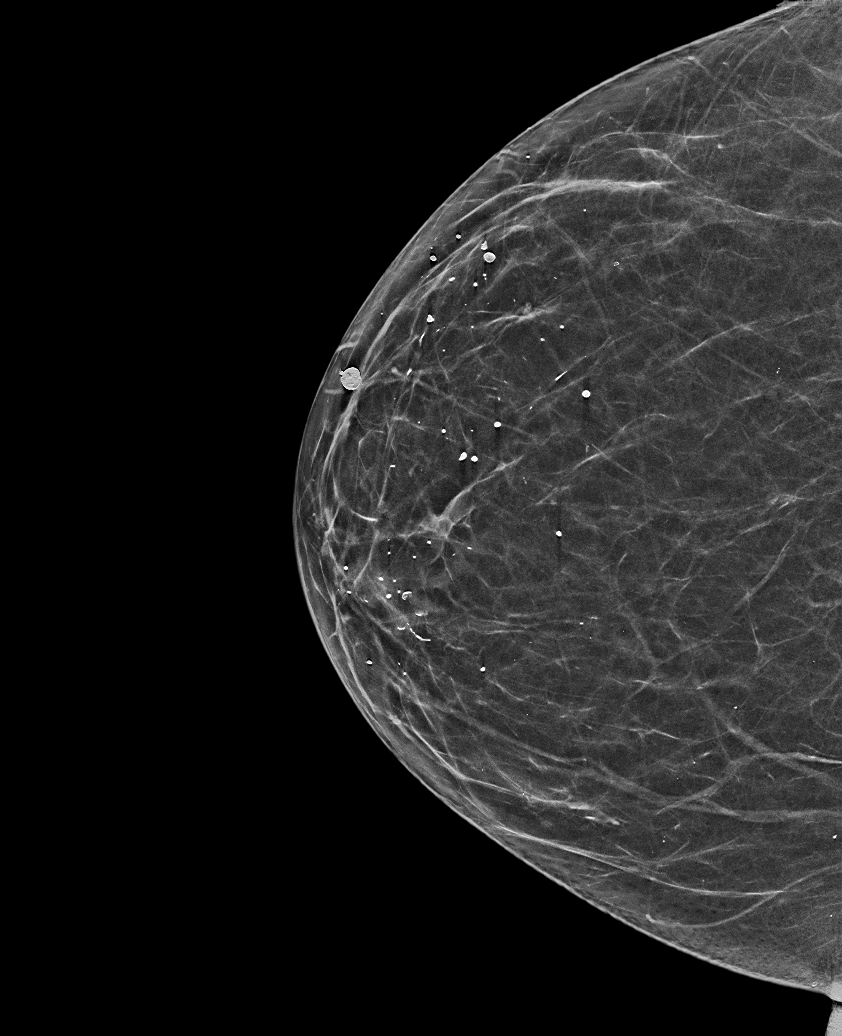

[R MLO synth-2D]
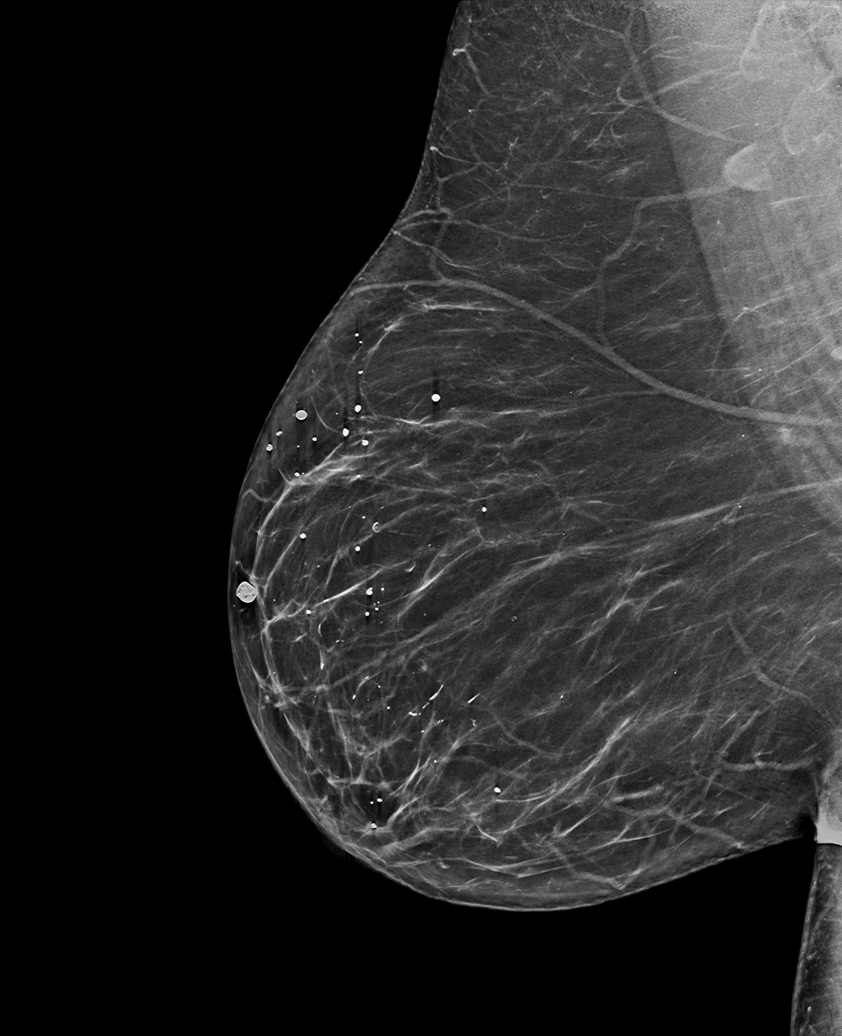

[L CC synth-2D]
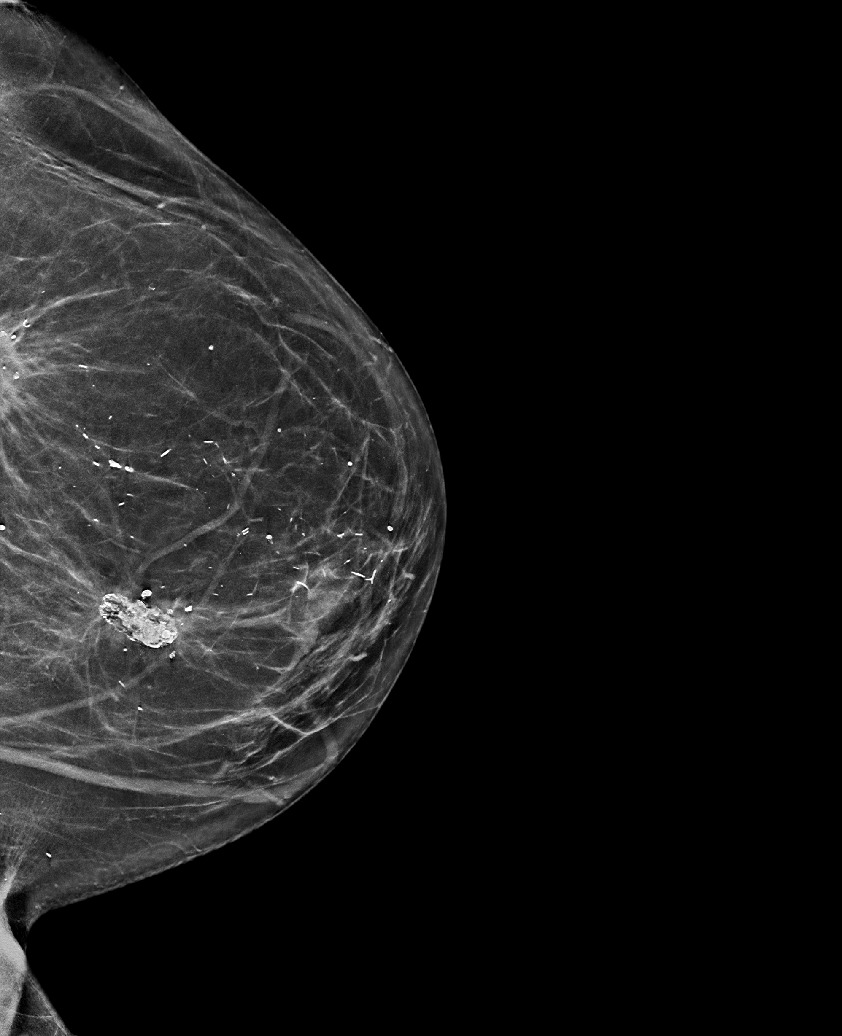

[L MLO synth-2D]
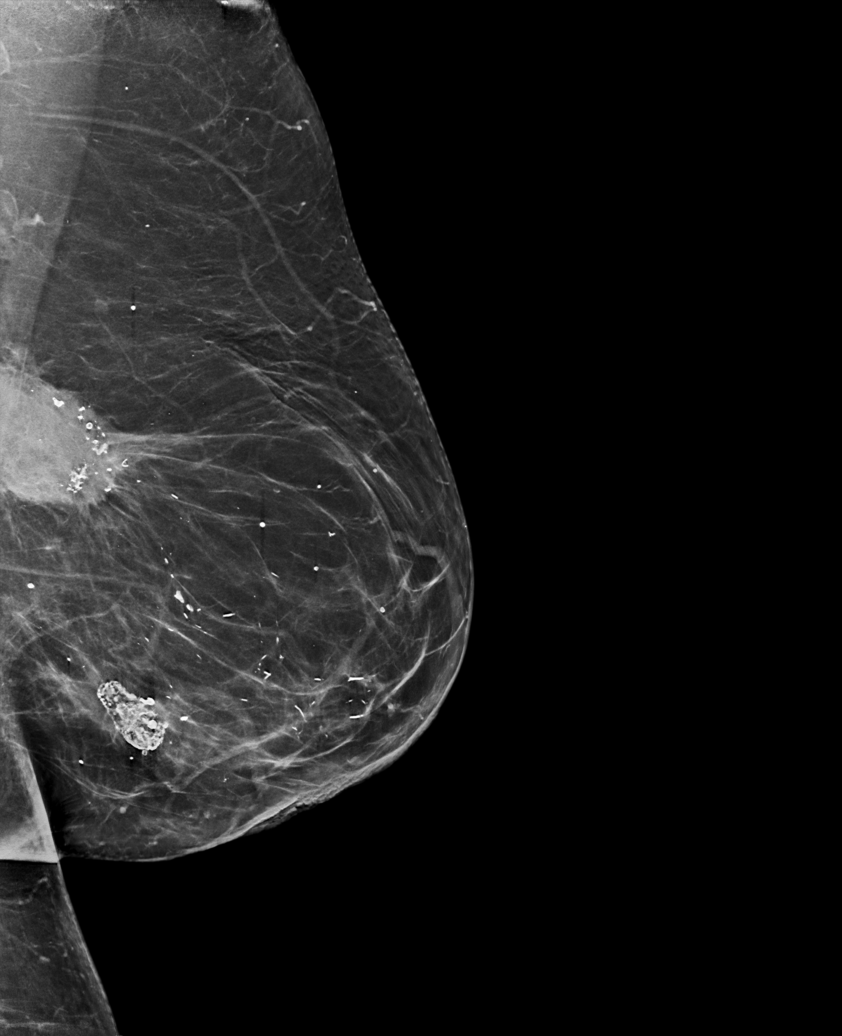

[R CC synth-2D (2 of 2)]
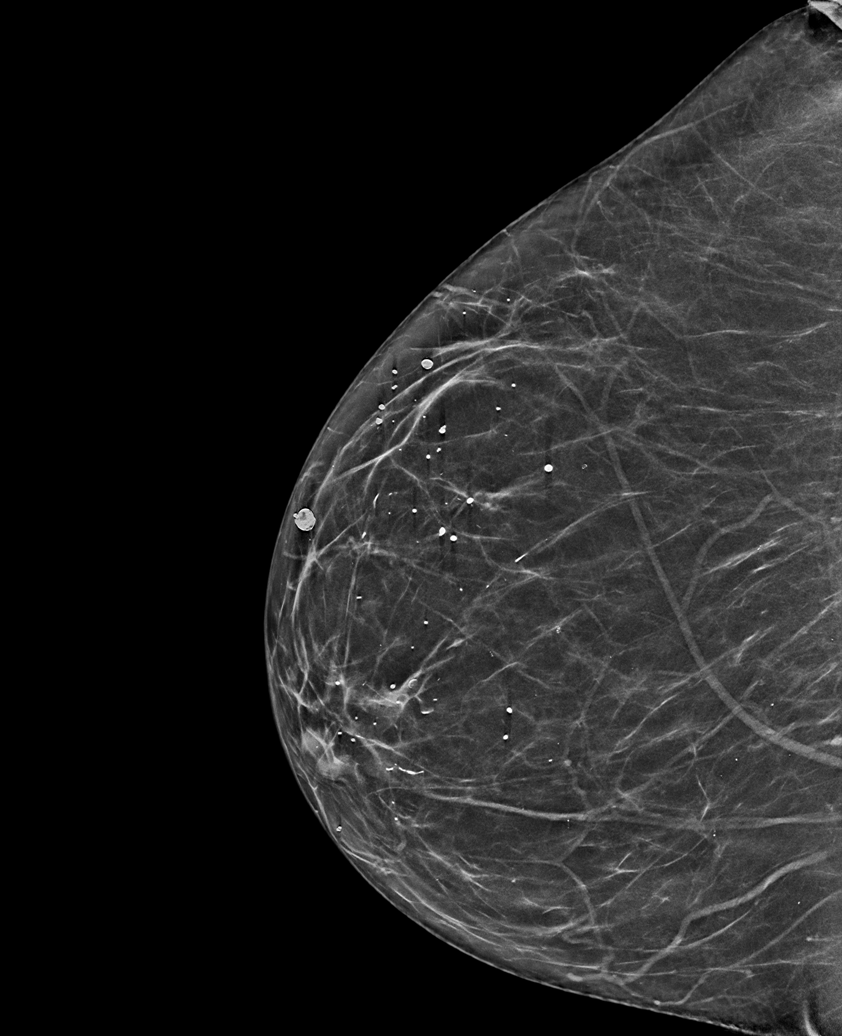

[R CC tomo · tomo slice 31/60.0]
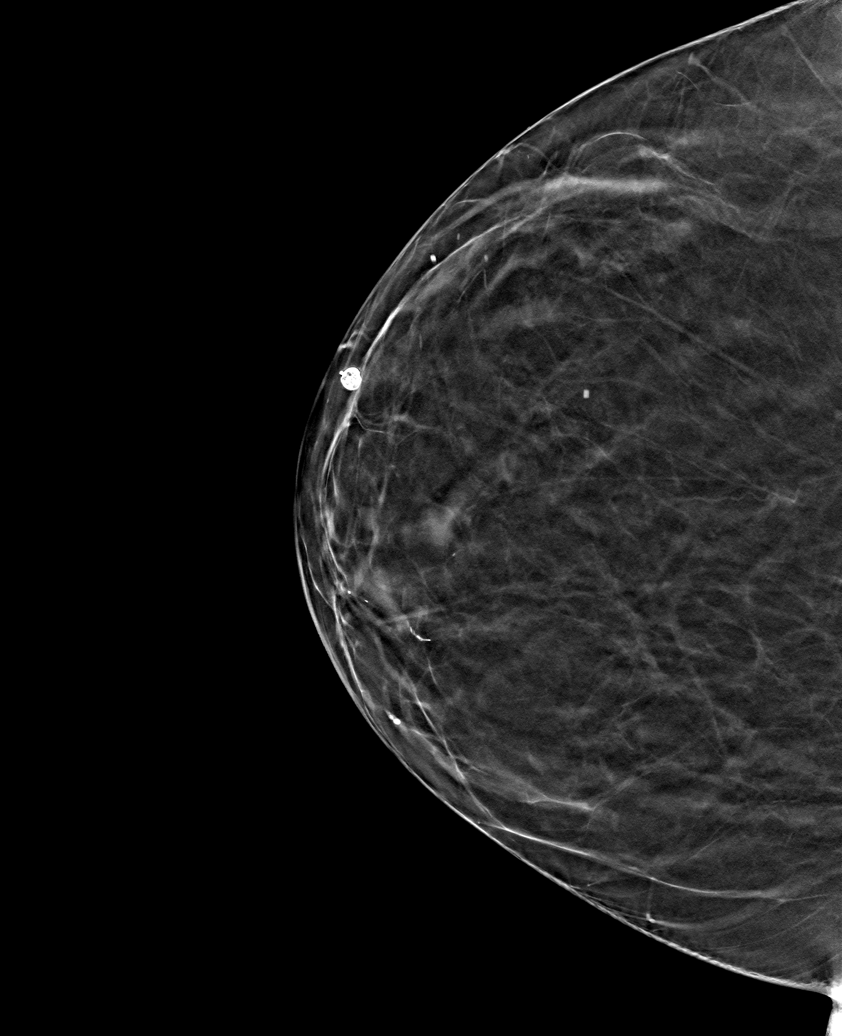

[6 of 30 positions shown; findings below may reference images not displayed]

ACR Breast Density Category b: There are scattered areas of
fibroglandular density.
FINDINGS: There are no findings suspicious for malignancy. There is density
and architectural distortion within the LEFT breast, consistent with
postsurgical changes. These are stable in comparison to prior.
IMPRESSION: No mammographic evidence of malignancy. A result letter of this
screening mammogram will be mailed directly to the patient.

RECOMMENDATION:
Screening mammogram in one year. (Code:RA-G-XFP)

BI-RADS CATEGORY  2: Benign.

## 2022-03-05 DIAGNOSIS — S83241A Other tear of medial meniscus, current injury, right knee, initial encounter: Secondary | ICD-10-CM | POA: Diagnosis not present

## 2022-03-05 DIAGNOSIS — M25561 Pain in right knee: Secondary | ICD-10-CM | POA: Diagnosis not present

## 2022-03-05 DIAGNOSIS — M94261 Chondromalacia, right knee: Secondary | ICD-10-CM | POA: Diagnosis not present

## 2022-03-05 DIAGNOSIS — M7051 Other bursitis of knee, right knee: Secondary | ICD-10-CM | POA: Diagnosis not present

## 2022-03-11 DIAGNOSIS — S83241D Other tear of medial meniscus, current injury, right knee, subsequent encounter: Secondary | ICD-10-CM | POA: Diagnosis not present

## 2022-03-14 ENCOUNTER — Ambulatory Visit (INDEPENDENT_AMBULATORY_CARE_PROVIDER_SITE_OTHER): Payer: Medicare Other

## 2022-03-14 DIAGNOSIS — Z23 Encounter for immunization: Secondary | ICD-10-CM

## 2022-04-01 ENCOUNTER — Ambulatory Visit (INDEPENDENT_AMBULATORY_CARE_PROVIDER_SITE_OTHER): Payer: Medicare Other | Admitting: *Deleted

## 2022-04-01 DIAGNOSIS — Z0000 Encounter for general adult medical examination without abnormal findings: Secondary | ICD-10-CM | POA: Diagnosis not present

## 2022-04-01 NOTE — Patient Instructions (Signed)
Ms. Annette Porter , Thank you for taking time to come for your Medicare Wellness Visit. I appreciate your ongoing commitment to your health goals. Please review the following plan we discussed and let me know if I can assist you in the future.   These are the goals we discussed:  Goals      DIET - INCREASE WATER INTAKE     Recommend drinking at least 6-8 glasses of water a day     Patient Stated     03/24/2020, wants to lose 25-30 pounds     Patient Stated     03/30/2021, wants to lose 25 pounds     Weight (lb) < 200 lb (90.7 kg)     Loose weight Would like to increase walking if knee will permit        This is a list of the screening recommended for you and due dates:  Health Maintenance  Topic Date Due   Pneumonia Vaccine (2 - PPSV23 or PCV20) 12/21/2015   COVID-19 Vaccine (4 - Pfizer risk series) 04/17/2022*   Mammogram  04/10/2023   Tetanus Vaccine  12/20/2024   Colon Cancer Screening  05/30/2025   Flu Shot  Completed   DEXA scan (bone density measurement)  Completed   Hepatitis C Screening: USPSTF Recommendation to screen - Ages 59-79 yo.  Completed   Zoster (Shingles) Vaccine  Completed   HPV Vaccine  Aged Out  *Topic was postponed. The date shown is not the original due date.    Advanced directives: Education provided  Next appointment: Follow up in one year for your annual wellness visit 08-09-2022 @ 8:40  Annette Porter 72 Years and Older, Female Preventive care refers to lifestyle choices and visits with your health care provider that can promote health and wellness. What does preventive care include? A yearly physical exam. This is also called an annual well check. Dental exams once or twice a year. Routine eye exams. Ask your health care provider how often you should have your eyes checked. Personal lifestyle choices, including: Daily care of your teeth and gums. Regular physical activity. Eating a healthy diet. Avoiding tobacco and drug  use. Limiting alcohol use. Practicing safe sex. Taking low-dose aspirin every day. Taking vitamin and mineral supplements as recommended by your health care provider. What happens during an annual well check? The services and screenings done by your health care provider during your annual well check will depend on your age, overall health, lifestyle risk factors, and family history of disease. Counseling  Your health care provider may ask you questions about your: Alcohol use. Tobacco use. Drug use. Emotional well-being. Home and relationship well-being. Sexual activity. Eating habits. History of falls. Memory and ability to understand (cognition). Work and work Statistician. Reproductive health. Screening  You may have the following tests or measurements: Height, weight, and BMI. Blood pressure. Lipid and cholesterol levels. These may be checked every 5 years, or more frequently if you are over 47 years old. Skin check. Lung cancer screening. You may have this screening every year starting at age 73 if you have a 30-pack-year history of smoking and currently smoke or have quit within the past 15 years. Fecal occult blood test (FOBT) of the stool. You may have this test every year starting at age 70. Flexible sigmoidoscopy or colonoscopy. You may have a sigmoidoscopy every 5 years or a colonoscopy every 10 years starting at age 38. Hepatitis C blood test. Hepatitis B blood test. Sexually transmitted  disease (STD) testing. Diabetes screening. This is done by checking your blood sugar (glucose) after you have not eaten for a while (fasting). You may have this done every 1-3 years. Bone density scan. This is done to screen for osteoporosis. You may have this done starting at age 43. Mammogram. This may be done every 1-2 years. Talk to your health care provider about how often you should have regular mammograms. Talk with your health care provider about your test results, treatment  options, and if necessary, the need for more tests. Vaccines  Your health care provider may recommend certain vaccines, such as: Influenza vaccine. This is recommended every year. Tetanus, diphtheria, and acellular pertussis (Tdap, Td) vaccine. You may need a Td booster every 10 years. Zoster vaccine. You may need this after age 68. Pneumococcal 13-valent conjugate (PCV13) vaccine. One dose is recommended after age 57. Pneumococcal polysaccharide (PPSV23) vaccine. One dose is recommended after age 33. Talk to your health care provider about which screenings and vaccines you need and how often you need them. This information is not intended to replace advice given to you by your health care provider. Make sure you discuss any questions you have with your health care provider. Document Released: 06/23/2015 Document Revised: 02/14/2016 Document Reviewed: 03/28/2015 Elsevier Interactive Patient Education  2017 [place] Prevention in the Home Falls can cause injuries. They can happen to people of all ages. There are many things you can do to make your home safe and to help prevent falls. What can I do on the outside of my home? Regularly fix the edges of walkways and driveways and fix any cracks. Remove anything that might make you trip as you walk through a door, such as a raised step or threshold. Trim any bushes or trees on the path to your home. Use bright outdoor lighting. Clear any walking paths of anything that might make someone trip, such as rocks or tools. Regularly check to see if handrails are loose or broken. Make sure that both sides of any steps have handrails. Any raised decks and porches should have guardrails on the edges. Have any leaves, snow, or ice cleared regularly. Use sand or salt on walking paths during winter. Clean up any spills in your garage right away. This includes oil or grease spills. What can I do in the bathroom? Use night lights. Install grab  bars by the toilet and in the tub and shower. Do not use towel bars as grab bars. Use non-skid mats or decals in the tub or shower. If you need to sit down in the shower, use a plastic, non-slip stool. Keep the floor dry. Clean up any water that spills on the floor as soon as it happens. Remove soap buildup in the tub or shower regularly. Attach bath mats securely with double-sided non-slip rug tape. Do not have throw rugs and other things on the floor that can make you trip. What can I do in the bedroom? Use night lights. Make sure that you have a light by your bed that is easy to reach. Do not use any sheets or blankets that are too big for your bed. They should not hang down onto the floor. Have a firm chair that has side arms. You can use this for support while you get dressed. Do not have throw rugs and other things on the floor that can make you trip. What can I do in the kitchen? Clean up any spills right away. Avoid walking  on wet floors. Keep items that you use a lot in easy-to-reach places. If you need to reach something above you, use a strong step stool that has a grab bar. Keep electrical cords out of the way. Do not use floor polish or wax that makes floors slippery. If you must use wax, use non-skid floor wax. Do not have throw rugs and other things on the floor that can make you trip. What can I do with my stairs? Do not leave any items on the stairs. Make sure that there are handrails on both sides of the stairs and use them. Fix handrails that are broken or loose. Make sure that handrails are as long as the stairways. Check any carpeting to make sure that it is firmly attached to the stairs. Fix any carpet that is loose or worn. Avoid having throw rugs at the top or bottom of the stairs. If you do have throw rugs, attach them to the floor with carpet tape. Make sure that you have a light switch at the top of the stairs and the bottom of the stairs. If you do not have them,  ask someone to add them for you. What else can I do to help prevent falls? Wear shoes that: Do not have high heels. Have rubber bottoms. Are comfortable and fit you well. Are closed at the toe. Do not wear sandals. If you use a stepladder: Make sure that it is fully opened. Do not climb a closed stepladder. Make sure that both sides of the stepladder are locked into place. Ask someone to hold it for you, if possible. Clearly mark and make sure that you can see: Any grab bars or handrails. First and last steps. Where the edge of each step is. Use tools that help you move around (mobility aids) if they are needed. These include: Canes. Walkers. Scooters. Crutches. Turn on the lights when you go into a dark area. Replace any light bulbs as soon as they burn out. Set up your furniture so you have a clear path. Avoid moving your furniture around. If any of your floors are uneven, fix them. If there are any pets around you, be aware of where they are. Review your medicines with your doctor. Some medicines can make you feel dizzy. This can increase your chance of falling. Ask your doctor what other things that you can do to help prevent falls. This information is not intended to replace advice given to you by your health care provider. Make sure you discuss any questions you have with your health care provider. Document Released: 03/23/2009 Document Revised: 11/02/2015 Document Reviewed: 07/01/2014 Elsevier Interactive Patient Education  2017 [employer].

## 2022-04-01 NOTE — Progress Notes (Signed)
Subjective:   Annette Porter is a 71 y.o. female who presents for Medicare Annual (Subsequent) preventive examination.  I connected with  [name] on 04/01/22 by a telephone enabled telemedicine application and verified that I am speaking with the correct person using two identifiers.   I discussed the limitations of evaluation and management by telemedicine. The patient expressed understanding and agreed to proceed.  Patient location: home  Provider location: Tele-health-home    Review of Systems     Cardiac Risk Factors include: advanced age (>33mn, >>17women);obesity (BMI >30kg/m2)     Objective:    Today's Vitals   There is no height or weight on file to calculate BMI.     04/01/2022    8:35 AM 03/30/2021    8:18 AM 03/24/2020    8:19 AM 02/17/2019    8:21 AM 11/30/2018    8:58 AM 01/26/2018    8:26 AM 10/24/2017   11:30 AM  Advanced Directives  Does Patient Have a Medical Advance Directive? No No No No No No No  Would patient like information on creating a medical advance directive? No - Patient declined    No - Patient declined Yes (MAU/Ambulatory/Procedural Areas - Information given)     Current Medications (verified) Outpatient Encounter Medications as of 04/01/2022  Medication Sig   aspirin 81 MG tablet Take 81 mg by mouth daily.   atorvastatin (LIPITOR) 40 MG tablet TAKE 1 TABLET BY MOUTH  DAILY   Calcium Carbonate (CALCIUM 600 PO) Take 1 tablet by mouth 2 (two) times daily.   Cholecalciferol (VITAMIN D3) 1000 UNITS CAPS Take 1 capsule by mouth daily.   docusate sodium (COLACE) 100 MG capsule Take 100 mg by mouth daily.   ezetimibe (ZETIA) 10 MG tablet Take 1 tablet (10 mg total) by mouth daily.   famotidine (PEPCID) 40 MG tablet Take 40 mg by mouth daily.   hydrochlorothiazide (HYDRODIURIL) 25 MG tablet Take 1 tablet (25 mg total) by mouth daily.   isosorbide mononitrate (IMDUR) 30 MG 24 hr tablet Take 30 mg by mouth daily.   isosorbide mononitrate  (IMDUR) 60 MG 24 hr tablet TAKE 1 TABLET BY MOUTH ONCE DAILY   levothyroxine (SYNTHROID) 125 MCG tablet Take 1 tablet (125 mcg total) by mouth daily.   methocarbamol (ROBAXIN) 500 MG tablet 1 tab PO QHS PRN Pain   metoprolol succinate (TOPROL-XL) 50 MG 24 hr tablet TAKE 1 TABLET BY MOUTH DAILY  WITH OR IMMEDIATELY FOLLOWING A  MEAL   montelukast (SINGULAIR) 10 MG tablet Take 1 tablet (10 mg total) by mouth at bedtime.   nitroGLYCERIN (NITROSTAT) 0.4 MG SL tablet Place 0.4 mg under the tongue every 5 (five) minutes as needed for chest pain.   vitamin B-12 (CYANOCOBALAMIN) 250 MCG tablet Take 500 mcg by mouth daily.    meloxicam (MOBIC) 15 MG tablet Take 15 mg by mouth daily. (Patient not taking: Reported on 04/01/2022)   No facility-administered encounter medications on file as of 04/01/2022.    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   BREAST LUMPECTOMY Left 12/16/2013   IMC 11 mm ER/ PR positive, Her 2 neu negative, T1c, N0. Mammosite radiation.    BREAST LUMPECTOMY WITH MAMMOSITE CAVITY EVALUATION DEVICE Left 2014   BREAST LUMPECTOMY WITH MAMMOSITE CAVITY EVALUATION DEVICE Left 2015   BREAST MAMMOSITE   05/20/13   BREAST SURGERY Left 04/29/13   wide excision   BREAST SURGERY Left 12-16-13   Wide excision with SN biopsy, INVASIVE MAMMARY CARCINOMA   CARDIAC CATHETERIZATION     COLONOSCOPY  2006   COLONOSCOPY WITH PROPOFOL N/A 05/31/2015   Procedure: COLONOSCOPY WITH PROPOFOL;  Surgeon: Robert Bellow, MD;  Location[facility];  Service: Endoscopy;  Laterality: N/A;   HAND SURGERY     KNEE ARTHROSCOPY WITH MEDIAL MENISECTOMY Left 02/24/2015   Procedure: KNEE ARTHROSCOPY WITH PARTIAL MEDIAL MENISECTOMY;  Surgeon: Christophe Louis, MD;  Location: [facility] ORS;  Service: Orthopedics;  Laterality: Left;   Family History  Problem Relation Age of Onset   Brain cancer Father    Thyroid disease Sister    Diabetes Maternal Uncle    Thyroid disease Paternal Aunt    Thyroid disease Sister    Thyroid disease Sister    Breast cancer Neg Hx    Social History   Socioeconomic History   Marital status: Married    Spouse name: Not on file   Number of children: Not on file   Years of education: Not on file   Highest education level: Some college, no degree  Occupational History   Occupation: retired  Tobacco Use   Smoking status: Never   Smokeless tobacco: Never  Vaping Use   Vaping Use: Never used  Substance and Sexual Activity   Alcohol use: No   Drug use: No   Sexual activity: Not Currently  Other Topics Concern   Not on file  Social History Narrative   Not on file   Social Determinants of Health   Financial Resource Strain: [place]  (04/01/2022)   Overall Financial Resource Strain (CARDIA)    Difficulty of Paying Living Expenses: Not hard at all  Food Insecurity: No Food Insecurity (04/01/2022)   Hunger Vital Sign    Worried About Running Out of Food in the Last Year: Never true    [place] in the Last Year: Never true  Transportation Needs: No Transportation Needs (04/01/2022)   PRAPARE - Hydrologist (Medical): No    Lack of  Transportation (Non-Medical): No  Physical Activity: Inactive (04/01/2022)   Exercise Vital Sign    Days of Exercise per Week: 0 days    Minutes of Exercise per Session: 0 min  Stress: No Stress Concern Present (04/01/2022)   [place]    Feeling of Stress : Not at all  Social Connections: [place] (04/01/2022)   Social Connection and Isolation Panel [NHANES]    Frequency of Communication with Friends and Family: More than three times a week    Frequency of Social Gatherings with Friends and Family: Once a  week    Attends Religious Services: More than 4 times per year    Active Member of Clubs or Organizations: No    Attends Music therapist: More than 4 times per year    Marital Status: Married    Tobacco Counseling Counseling given: Not Answered   Clinical Intake:  Pre-visit preparation completed: Yes  Pain : No/denies pain     Diabetes: No  How often do you need to have someone help you when you read instructions, pamphlets, or other written materials from your doctor or pharmacy?: 1 - Never  Diabetic?  no  Interpreter Needed?: No  Information entered by :: Leroy Kennedy LPN   Activities of Daily Living    04/01/2022    8:43 AM  In your present state of health, do you have any difficulty performing the following activities:  Hearing? 0  Vision? 0  Difficulty concentrating or making decisions? 0  Walking or climbing stairs? 0  Dressing or bathing? 0  Doing errands, shopping? 0  Preparing Food and eating ? N  Using the Toilet? N  In the past six months, have you accidently leaked urine? N  Do you have problems with loss of bowel control? N  Managing your Medications? N  Managing your Finances? N  Housekeeping or managing your Housekeeping? N    Patient Care Team: Charlynne Cousins, MD (Inactive) as PCP - General (Internal Medicine) Bary Castilla [place] Gleason, MD (General  Surgery) Guadalupe Maple, MD (Family Medicine) Noreene Filbert, MD as Referring Physician (Radiation Oncology) Lollie Sails, MD (Inactive) as Consulting Physician (Gastroenterology) Teodoro Spray, MD as Consulting Physician (Cardiology)  Indicate any recent Medical Services you may have received from other than [facility] providers in the past year (date may be approximate).     Assessment:   This is a routine wellness examination for Janaria.  Hearing/Vision screen Hearing Screening - Comments:: No trouble hearing Vision Screening - Comments:: Up to date [facility] [place]  Dietary issues and exercise activities discussed: Current Exercise Habits: The patient does not participate in regular exercise at present, Exercise limited by: orthopedic condition(s)   Goals Addressed             This Visit's Progress    DIET - INCREASE WATER INTAKE   On track    Recommend drinking at least 6-8 glasses of water a day     Patient Stated   Not on track    03/24/2020, wants to lose 25-30 pounds     Patient Stated   Not on track    03/30/2021, wants to lose 25 pounds     Weight (lb) < 200 lb (90.7 kg)       Loose weight Would like to increase walking if knee will permit       Depression Screen    04/01/2022    8:42 AM 02/06/2022    8:20 AM 08/17/2021    8:32 AM 06/27/2021   11:13 AM 03/30/2021    8:19 AM 11/20/2020    8:23 AM 10/02/2020    9:22 AM  PHQ 2/9 Scores  PHQ - 2 Score 0 0 0 0 0 0 0  PHQ- 9 Score 0 0 0 0       Fall Risk    04/01/2022    8:35 AM 02/06/2022    8:20 AM 08/17/2021    8:32 AM 06/27/2021   11:13 AM 03/30/2021    8:18 AM  [place]  in the past year? 0 0 0 0 0  Number falls in past yr: 0 0 0 0   Injury with Fall? 0 0 0 0   Risk for fall due to :  No Fall Risks No Fall Risks No Fall Risks Medication side effect  Follow up Education provided;Falls prevention discussed;Falls evaluation completed Falls evaluation completed Falls evaluation  completed Falls evaluation completed Falls evaluation completed;Education provided;Falls prevention discussed    FALL RISK PREVENTION PERTAINING TO THE HOME:  Any stairs in or around the home? Yes  If so, are there any without handrails? No  Home free of loose throw rugs in walkways, pet beds, electrical cords, etc? No  Adequate lighting in your home to reduce risk of falls? No   ASSISTIVE DEVICES UTILIZED TO PREVENT FALLS:  Life alert? No  Use of a cane, walker or w/c? No  Grab bars in the bathroom? No  Shower chair or bench in shower? Yes  Elevated toilet seat or a handicapped toilet? Yes   TIMED UP AND GO:  Was the test performed? No .     Cognitive Function:        04/01/2022    8:36 AM 03/30/2021    8:21 AM 03/24/2020    8:23 AM 01/26/2018    8:28 AM 01/22/2017    8:30 AM  6CIT Screen  What Year? 0 points 0 points 0 points 0 points 0 points  What month? 0 points 0 points 0 points 0 points 0 points  What time? 0 points 0 points 0 points 0 points 0 points  Count back from 20 0 points 0 points 0 points 0 points 0 points  Months in reverse 0 points 0 points 0 points 0 points 0 points  Repeat phrase 0 points 0 points 0 points 0 points 0 points  Total Score 0 points 0 points 0 points 0 points 0 points    Immunizations Immunization History  Administered Date(s) Administered   Fluad Quad(high Dose 65+) 02/17/2019, 03/27/2020, 03/27/2021, 03/14/2022   Influenza, High Dose Seasonal PF 03/19/2016, 03/17/2017, 03/26/2018   Influenza,inj,Quad PF,6+ Mos 03/22/2015   PFIZER(Purple Top)SARS-COV-2 Vaccination 08/16/2019, 09/07/2019, 08/24/2020   Pneumococcal Conjugate-13 12/21/2014   Pneumococcal-Unspecified 04/12/2004, 06/26/2009   Td 04/29/2005, 12/21/2014   Zoster Recombinat (Shingrix) 11/24/2019, 06/07/2020    TDAP status: Up to date  Flu Vaccine status: Up to date  Pneumococcal vaccine status: Up to date  Covid-19 vaccine status: Information provided on how to  obtain vaccines.   Qualifies for Shingles Vaccine? No   Zostavax completed No   Shingrix Completed?: Yes  Screening Tests Health Maintenance  Topic Date Due   Pneumonia Vaccine 82+ Years old (2 - PPSV23 or PCV20) 12/21/2015   COVID-19 Vaccine (4 - Pfizer risk series) 04/17/2022 (Originally 10/19/2020)   MAMMOGRAM  04/10/2023   TETANUS/TDAP  12/20/2024   COLONOSCOPY (Pts 45-81yr Insurance coverage will need to be confirmed)  05/30/2025   INFLUENZA VACCINE  Completed   DEXA SCAN  Completed   Hepatitis C Screening  Completed   Zoster Vaccines- Shingrix  Completed   HPV VACCINES  Aged Out    Health Maintenance  Health Maintenance Due  Topic Date Due   Pneumonia Vaccine 72 Years old (2 - PPSV23 or PCV20) 12/21/2015    Colorectal cancer screening: Type of screening: Colonoscopy. Completed 2016. Repeat every 10 years  Mammogram scheduled 04-15-2022  Bone Density status: Completed 2022. Results reflect: Bone density results: NORMAL. Repeat every 5 years.  Lung Cancer  Screening: (Low Dose CT Chest recommended if Age 60-80 years, 30 pack-year currently smoking OR have quit w/in 15years.) does not qualify.   Lung Cancer Screening Referral:   Additional Screening:  Hepatitis C Screening: does not qualify; Completed 2018  Vision Screening: Recommended annual ophthalmology exams for early detection of glaucoma and other disorders of the eye. Is the patient up to date with their annual eye exam?  Yes  Who is the provider or what is the name of the office in which the patient attends annual eye exams? [facility] [place] If pt is not established with a provider, would they like to be referred to a provider to establish care? No .   Dental Screening: Recommended annual dental exams for proper oral hygiene  Community Resource Referral / Chronic Care Management: CRR required this visit?  No   CCM required this visit?  No      Plan:     I have personally reviewed and noted  the following in the patient's chart:   Medical and social history Use of alcohol, tobacco or illicit drugs  Current medications and supplements including opioid prescriptions. Patient is not currently taking opioid prescriptions. Functional ability and status Nutritional status Physical activity Advanced directives List of other physicians Hospitalizations, surgeries, and ER visits in previous 12 months Vitals Screenings to include cognitive, depression, and falls Referrals and appointments  In addition, I have reviewed and discussed with patient certain preventive protocols, quality metrics, and best practice recommendations. A written personalized care plan for preventive services as well as general preventive health recommendations were provided to patient.     Leroy Kennedy, LPN   [id number]   Nurse Notes:

## 2022-04-04 ENCOUNTER — Other Ambulatory Visit: Payer: Self-pay

## 2022-04-04 DIAGNOSIS — I10 Essential (primary) hypertension: Secondary | ICD-10-CM

## 2022-04-04 MED ORDER — HYDROCHLOROTHIAZIDE 25 MG PO TABS: 25.0000 mg | ORAL_TABLET | Freq: Every day | ORAL | 1 refills | Status: DC

## 2022-04-04 NOTE — Telephone Encounter (Signed)
Last seen with Erin 02/06/22, has appointment with [facility] 08/09/22

## 2022-04-08 ENCOUNTER — Encounter (INDEPENDENT_AMBULATORY_CARE_PROVIDER_SITE_OTHER): Payer: Self-pay

## 2022-04-15 ENCOUNTER — Ambulatory Visit
Admission: RE | Admit: 2022-04-15 | Discharge: 2022-04-15 | Disposition: A | Discharge status: Home / Self Care | Payer: Medicare Other | Source: Ambulatory Visit | Attending: Family Medicine | Admitting: Family Medicine

## 2022-04-15 DIAGNOSIS — Z1231 Encounter for screening mammogram for malignant neoplasm of breast: Secondary | ICD-10-CM | POA: Insufficient documentation

## 2022-04-23 DIAGNOSIS — Z853 Personal history of malignant neoplasm of breast: Secondary | ICD-10-CM | POA: Diagnosis not present

## 2022-06-13 DIAGNOSIS — S83241D Other tear of medial meniscus, current injury, right knee, subsequent encounter: Secondary | ICD-10-CM | POA: Diagnosis not present

## 2022-06-14 ENCOUNTER — Other Ambulatory Visit: Payer: Self-pay | Admitting: Family Medicine

## 2022-06-14 DIAGNOSIS — E785 Hyperlipidemia, unspecified: Secondary | ICD-10-CM

## 2022-06-17 NOTE — Telephone Encounter (Signed)
Requested medications are due for refill today.  yes  Requested medications are on the active medications list.  yes  Last refill. 10/05/2021 #90 1 rf  Future visit scheduled.   yes  Notes to clinic.  Dr. Neomia Dear listed as PCP.    Requested Prescriptions  Pending Prescriptions Disp Refills   atorvastatin (LIPITOR) 40 MG tablet [Pharmacy Med Name: Atorvastatin Calcium 40 MG Oral Tablet] 90 tablet 3    Sig: TAKE 1 TABLET BY MOUTH  DAILY     Cardiovascular:  Antilipid - Statins Failed - 06/14/2022 10:42 PM      Failed - Lipid Panel in normal range within the last 12 months    Cholesterol, Total  Date Value Ref Range Status  02/06/2022 141 100 - 199 mg/dL Final   Cholesterol Piccolo, Waived  Date Value Ref Range Status  08/17/2018 133 <200 mg/dL Final    Comment:                            Desirable                <200                         Borderline High      200- 239                         High                     >239    LDL Chol Calc (NIH)  Date Value Ref Range Status  02/06/2022 69 0 - 99 mg/dL Final   HDL  Date Value Ref Range Status  02/06/2022 50 >39 mg/dL Final   Triglycerides  Date Value Ref Range Status  02/06/2022 122 0 - 149 mg/dL Final   Triglycerides Piccolo,Waived  Date Value Ref Range Status  08/17/2018 135 <150 mg/dL Final    Comment:                            Normal                   <150                         Borderline High     150 - 199                         High                200 - 499                         Very High                >499          Passed - Patient is not pregnant      Passed - Valid encounter within last 12 months    Recent Outpatient Visits           4 months ago Primary hypertension   [facility] Mecum, Erin E, PA-C   10 months ago Hyperlipidemia, unspecified hyperlipidemia type   [facility] Vigg, Avanti, MD   11 months ago Primary hypertension  [facility] Vigg,  Avanti, MD   1 year ago Need for influenza vaccination   [facility] Vigg, Avanti, MD   1 year ago Hypothyroidism, unspecified type   [place] Vigg, Avanti, MD       Future Appointments             In 1 month Cannady, Barbaraann Faster, NP [employer], [facility]

## 2022-07-03 ENCOUNTER — Other Ambulatory Visit: Payer: Self-pay

## 2022-07-03 MED ORDER — MONTELUKAST SODIUM 10 MG PO TABS: 10.0000 mg | ORAL_TABLET | Freq: Every day | ORAL | 0 refills | Status: DC

## 2022-07-03 MED ORDER — LEVOTHYROXINE SODIUM 125 MCG PO TABS: 125.0000 ug | ORAL_TABLET | Freq: Every day | ORAL | 0 refills | Status: DC

## 2022-07-03 NOTE — Telephone Encounter (Signed)
Medication refill for Synthroid 125 mcg and Singlulair 10 mg last ov 02/06/22, upcoming ov 08/09/22 . Please advise

## 2022-07-07 DIAGNOSIS — M25561 Pain in right knee: Secondary | ICD-10-CM | POA: Diagnosis not present

## 2022-07-18 DIAGNOSIS — S83241D Other tear of medial meniscus, current injury, right knee, subsequent encounter: Secondary | ICD-10-CM | POA: Diagnosis not present

## 2022-07-18 DIAGNOSIS — M25561 Pain in right knee: Secondary | ICD-10-CM | POA: Diagnosis not present

## 2022-07-19 ENCOUNTER — Other Ambulatory Visit: Payer: Self-pay

## 2022-07-19 DIAGNOSIS — E785 Hyperlipidemia, unspecified: Secondary | ICD-10-CM

## 2022-07-19 DIAGNOSIS — I2510 Atherosclerotic heart disease of native coronary artery without angina pectoris: Secondary | ICD-10-CM

## 2022-07-19 DIAGNOSIS — M25561 Pain in right knee: Secondary | ICD-10-CM | POA: Diagnosis not present

## 2022-07-19 MED ORDER — LEVOTHYROXINE SODIUM 125 MCG PO TABS: 125.0000 ug | ORAL_TABLET | Freq: Every day | ORAL | 0 refills | Status: DC

## 2022-07-19 MED ORDER — MONTELUKAST SODIUM 10 MG PO TABS: 10.0000 mg | ORAL_TABLET | Freq: Every day | ORAL | 0 refills | Status: DC

## 2022-07-19 NOTE — Telephone Encounter (Signed)
Medication refill for Snythorid 179mg, and Singular 168mlast ov 02/06/22, upcoming ov 08/09/22 . Please advise

## 2022-07-23 ENCOUNTER — Telehealth: Payer: Self-pay | Admitting: Nurse Practitioner

## 2022-07-23 DIAGNOSIS — E785 Hyperlipidemia, unspecified: Secondary | ICD-10-CM

## 2022-07-23 DIAGNOSIS — I2510 Atherosclerotic heart disease of native coronary artery without angina pectoris: Secondary | ICD-10-CM

## 2022-07-23 MED ORDER — EZETIMIBE 10 MG PO TABS: 10.0000 mg | ORAL_TABLET | Freq: Every day | ORAL | 1 refills | Status: DC

## 2022-07-23 NOTE — Telephone Encounter (Signed)
Pt calling requesting to transfer Rx from [place] to pharmacy below :  [facility] ([place] - [place], [place] [facility]  [address] [place] [address]  Phone: [phone] Fax: [phone]  Hours: Not open 24 hours   Please advise.

## 2022-07-23 NOTE — Telephone Encounter (Signed)
Medication refill for Zetia last ov 02/06/22, upcoming ov 08/08/21 . Please advise

## 2022-07-23 NOTE — Telephone Encounter (Signed)
Pt states that she has an appt scheduled on 08/09/2022 and she is wanting to see if her medication ezetimibe (ZETIA) 10 MG tablet  could be called in before her appt so that she does not run out.    [facility] ([place], [place] Phone: [phone]  Fax: [phone]

## 2022-07-23 NOTE — Addendum Note (Signed)
Addended by: Marnee Guarneri T on: 07/23/2022 12:08 PM   Modules accepted: Orders

## 2022-07-23 NOTE — Addendum Note (Signed)
Addended by: Donzetta Kohut A on: 07/23/2022 11:31 AM   Modules accepted: Orders

## 2022-07-23 NOTE — Addendum Note (Signed)
Addended by: Marnee Guarneri T on: 07/23/2022 01:28 PM   Modules accepted: Orders

## 2022-07-26 ENCOUNTER — Other Ambulatory Visit: Payer: Self-pay | Admitting: Nurse Practitioner

## 2022-07-26 NOTE — Telephone Encounter (Signed)
Medication Refill - Medication: levothyroxine (SYNTHROID) 125 MCG tablet  Shows went ot [facility]. Patient [facility]  Has the patient contacted their pharmacy? yes (Agent: If no, request that the patient contact the pharmacy for the refill. If patient does not wish to contact the pharmacy document the reason why and proceed with request.) (Agent: If yes, when and what did the pharmacy advise?)  Preferred Pharmacy (with phone number or street name):  [facility] ([place]) [place], [place] Phone: [phone]  Fax: [phone]     Has the patient been seen for an appointment in the last year OR does the patient have an upcoming appointment? yes  Agent: Please be advised that RX refills may take up to 3 business days. We ask that you follow-up with your pharmacy.

## 2022-07-29 MED ORDER — LEVOTHYROXINE SODIUM 125 MCG PO TABS: 125.0000 ug | ORAL_TABLET | Freq: Every day | ORAL | 0 refills | Status: DC

## 2022-07-29 NOTE — Telephone Encounter (Signed)
Requested Prescriptions  Pending Prescriptions Disp Refills   levothyroxine (SYNTHROID) 125 MCG tablet 30 tablet 0    Sig: Take 1 tablet (125 mcg total) by mouth daily. NEEDS APPOINTMENT FOR FURTHER REFILLS     Endocrinology:  Hypothyroid Agents Passed - 07/26/2022  3:42 PM      Passed - TSH in normal range and within 360 days    TSH  Date Value Ref Range Status  02/06/2022 1.260 0.450 - 4.500 uIU/mL Final         Passed - Valid encounter within last 12 months    Recent Outpatient Visits           5 months ago Primary hypertension   [place] [facility] Mecum, Erin E, PA-C   11 months ago Hyperlipidemia, unspecified hyperlipidemia type   [place] [facility] Vigg, Avanti, MD   1 year ago Primary hypertension   [place] Vigg, Avanti, MD   1 year ago Need for influenza vaccination   [place] Vigg, Avanti, MD   1 year ago Hypothyroidism, unspecified type   [place] Vigg, Avanti, MD       Future Appointments             In 3 weeks Cannady, Barbaraann Faster, NP [place], [facility]

## 2022-08-07 DIAGNOSIS — S83231A Complex tear of medial meniscus, current injury, right knee, initial encounter: Secondary | ICD-10-CM | POA: Diagnosis not present

## 2022-08-07 DIAGNOSIS — M222X1 Patellofemoral disorders, right knee: Secondary | ICD-10-CM | POA: Diagnosis not present

## 2022-08-07 DIAGNOSIS — M94261 Chondromalacia, right knee: Secondary | ICD-10-CM | POA: Diagnosis not present

## 2022-08-07 DIAGNOSIS — M94262 Chondromalacia, left knee: Secondary | ICD-10-CM | POA: Diagnosis not present

## 2022-08-07 DIAGNOSIS — M6751 Plica syndrome, right knee: Secondary | ICD-10-CM | POA: Diagnosis not present

## 2022-08-07 DIAGNOSIS — S83012A Lateral subluxation of left patella, initial encounter: Secondary | ICD-10-CM | POA: Diagnosis not present

## 2022-08-09 ENCOUNTER — Ambulatory Visit: Payer: Medicare Other | Admitting: Nurse Practitioner

## 2022-08-14 DIAGNOSIS — M25561 Pain in right knee: Secondary | ICD-10-CM | POA: Diagnosis not present

## 2022-08-15 DIAGNOSIS — E782 Mixed hyperlipidemia: Secondary | ICD-10-CM | POA: Diagnosis not present

## 2022-08-15 DIAGNOSIS — I10 Essential (primary) hypertension: Secondary | ICD-10-CM | POA: Diagnosis not present

## 2022-08-15 DIAGNOSIS — I25118 Atherosclerotic heart disease of native coronary artery with other forms of angina pectoris: Secondary | ICD-10-CM | POA: Diagnosis not present

## 2022-08-15 DIAGNOSIS — I77811 Abdominal aortic ectasia: Secondary | ICD-10-CM | POA: Diagnosis not present

## 2022-08-18 DIAGNOSIS — I77811 Abdominal aortic ectasia: Secondary | ICD-10-CM | POA: Insufficient documentation

## 2022-08-19 ENCOUNTER — Telehealth: Payer: Medicare Other | Admitting: Nurse Practitioner

## 2022-08-19 DIAGNOSIS — M25561 Pain in right knee: Secondary | ICD-10-CM | POA: Diagnosis not present

## 2022-08-20 ENCOUNTER — Other Ambulatory Visit: Payer: Medicare Other

## 2022-08-20 DIAGNOSIS — K219 Gastro-esophageal reflux disease without esophagitis: Secondary | ICD-10-CM | POA: Diagnosis not present

## 2022-08-20 DIAGNOSIS — R7301 Impaired fasting glucose: Secondary | ICD-10-CM

## 2022-08-20 DIAGNOSIS — I2583 Coronary atherosclerosis due to lipid rich plaque: Secondary | ICD-10-CM | POA: Diagnosis not present

## 2022-08-20 DIAGNOSIS — E039 Hypothyroidism, unspecified: Secondary | ICD-10-CM | POA: Diagnosis not present

## 2022-08-20 DIAGNOSIS — E559 Vitamin D deficiency, unspecified: Secondary | ICD-10-CM

## 2022-08-20 DIAGNOSIS — I77811 Abdominal aortic ectasia: Secondary | ICD-10-CM

## 2022-08-20 DIAGNOSIS — I2510 Atherosclerotic heart disease of native coronary artery without angina pectoris: Secondary | ICD-10-CM

## 2022-08-20 DIAGNOSIS — I10 Essential (primary) hypertension: Secondary | ICD-10-CM | POA: Diagnosis not present

## 2022-08-20 DIAGNOSIS — I252 Old myocardial infarction: Secondary | ICD-10-CM

## 2022-08-20 DIAGNOSIS — E782 Mixed hyperlipidemia: Secondary | ICD-10-CM

## 2022-08-20 LAB — MICROALBUMIN, URINE WAIVED
Creatinine, Urine Waived: 100 mg/dL (ref 10–300)
Microalb, Ur Waived: 30 mg/L — ABNORMAL HIGH (ref 0–19)
Microalb/Creat Ratio: <30 mg/g (ref <30)

## 2022-08-20 LAB — BAYER DCA HB A1C WAIVED: HB A1C (BAYER DCA - WAIVED): 6 % — ABNORMAL HIGH (ref 4.8–5.6)

## 2022-08-21 ENCOUNTER — Ambulatory Visit (INDEPENDENT_AMBULATORY_CARE_PROVIDER_SITE_OTHER): Payer: Medicare Other | Admitting: Nurse Practitioner

## 2022-08-21 ENCOUNTER — Telehealth: Payer: Medicare Other | Admitting: Nurse Practitioner

## 2022-08-21 ENCOUNTER — Encounter: Payer: Self-pay | Admitting: Nurse Practitioner

## 2022-08-21 ENCOUNTER — Telehealth: Payer: Self-pay | Admitting: Nurse Practitioner

## 2022-08-21 VITALS — BP 127/74 | HR 78 | Temp 98.0°F | Ht 61.02 in | Wt 222.8 lb

## 2022-08-21 DIAGNOSIS — I2510 Atherosclerotic heart disease of native coronary artery without angina pectoris: Secondary | ICD-10-CM

## 2022-08-21 DIAGNOSIS — I77811 Abdominal aortic ectasia: Secondary | ICD-10-CM

## 2022-08-21 DIAGNOSIS — I252 Old myocardial infarction: Secondary | ICD-10-CM

## 2022-08-21 DIAGNOSIS — I10 Essential (primary) hypertension: Secondary | ICD-10-CM | POA: Diagnosis not present

## 2022-08-21 DIAGNOSIS — E785 Hyperlipidemia, unspecified: Secondary | ICD-10-CM

## 2022-08-21 DIAGNOSIS — E782 Mixed hyperlipidemia: Secondary | ICD-10-CM

## 2022-08-21 DIAGNOSIS — K219 Gastro-esophageal reflux disease without esophagitis: Secondary | ICD-10-CM

## 2022-08-21 DIAGNOSIS — I2583 Coronary atherosclerosis due to lipid rich plaque: Secondary | ICD-10-CM

## 2022-08-21 DIAGNOSIS — Z853 Personal history of malignant neoplasm of breast: Secondary | ICD-10-CM

## 2022-08-21 DIAGNOSIS — E039 Hypothyroidism, unspecified: Secondary | ICD-10-CM | POA: Diagnosis not present

## 2022-08-21 DIAGNOSIS — E559 Vitamin D deficiency, unspecified: Secondary | ICD-10-CM

## 2022-08-21 DIAGNOSIS — R7309 Other abnormal glucose: Secondary | ICD-10-CM

## 2022-08-21 LAB — COMPREHENSIVE METABOLIC PANEL
ALT: 18 IU/L (ref 0–32)
AST: 17 IU/L (ref 0–40)
Albumin/Globulin Ratio: 1.5 (ref 1.2–2.2)
Albumin: 4.1 g/dL (ref 3.8–4.8)
Alkaline Phosphatase: 85 IU/L (ref 44–121)
BUN/Creatinine Ratio: 21 (ref 12–28)
BUN: 15 mg/dL (ref 8–27)
Bilirubin Total: 0.3 mg/dL (ref 0.0–1.2)
CO2: 23 mmol/L (ref 20–29)
Calcium: 9.3 mg/dL (ref 8.7–10.3)
Chloride: 95 mmol/L — ABNORMAL LOW (ref 96–106)
Creatinine, Ser: 0.72 mg/dL (ref 0.57–1.00)
Globulin, Total: 2.8 g/dL (ref 1.5–4.5)
Glucose: 98 mg/dL (ref 70–99)
Potassium: 4.2 mmol/L (ref 3.5–5.2)
Sodium: 134 mmol/L (ref 134–144)
Total Protein: 6.9 g/dL (ref 6.0–8.5)
eGFR: 89 mL/min/{1.73_m2} (ref >59)

## 2022-08-21 LAB — CBC WITH DIFFERENTIAL/PLATELET
Basophils Absolute: 0.1 10*3/uL (ref 0.0–0.2)
Basos: 1 %
EOS (ABSOLUTE): 0.2 10*3/uL (ref 0.0–0.4)
Eos: 2 %
Hematocrit: 44.3 % (ref 34.0–46.6)
Hemoglobin: 14.4 g/dL (ref 11.1–15.9)
Immature Grans (Abs): 0 10*3/uL (ref 0.0–0.1)
Immature Granulocytes: 0 %
Lymphocytes Absolute: 1.9 10*3/uL (ref 0.7–3.1)
Lymphs: 19 %
MCH: 30.1 pg (ref 26.6–33.0)
MCHC: 32.5 g/dL (ref 31.5–35.7)
MCV: 93 fL (ref 79–97)
Monocytes Absolute: 0.9 10*3/uL (ref 0.1–0.9)
Monocytes: 9 %
Neutrophils Absolute: 6.8 10*3/uL (ref 1.4–7.0)
Neutrophils: 69 %
Platelets: 264 10*3/uL (ref 150–450)
RBC: 4.79 x10E6/uL (ref 3.77–5.28)
RDW: 13.1 % (ref 11.7–15.4)
WBC: 9.9 10*3/uL (ref 3.4–10.8)

## 2022-08-21 LAB — VITAMIN D 25 HYDROXY (VIT D DEFICIENCY, FRACTURES): Vit D, 25-Hydroxy: 40.7 ng/mL (ref 30.0–100.0)

## 2022-08-21 LAB — LIPID PANEL W/O CHOL/HDL RATIO
Cholesterol, Total: 145 mg/dL (ref 100–199)
HDL: 55 mg/dL (ref >39)
LDL Chol Calc (NIH): 70 mg/dL (ref 0–99)
Triglycerides: 109 mg/dL (ref 0–149)
VLDL Cholesterol Cal: 20 mg/dL (ref 5–40)

## 2022-08-21 LAB — T4, FREE: Free T4: 1.67 ng/dL (ref 0.82–1.77)

## 2022-08-21 LAB — THYROID PEROXIDASE ANTIBODY: Thyroperoxidase Ab SerPl-aCnc: 21 IU/mL (ref 0–34)

## 2022-08-21 LAB — MAGNESIUM: Magnesium: 1.6 mg/dL (ref 1.6–2.3)

## 2022-08-21 LAB — TSH: TSH: 0.197 u[IU]/mL — ABNORMAL LOW (ref 0.450–4.500)

## 2022-08-21 NOTE — Assessment & Plan Note (Signed)
BMI 43.06 with HTN, HLD, CAD.  Recommended eating smaller high protein, low fat meals more frequently and exercising 30 mins a day 5 times a week with a goal of 10-15lb weight loss in the next 3 months. Patient voiced their understanding and motivation to adhere to these recommendations.

## 2022-08-21 NOTE — Assessment & Plan Note (Signed)
Chronic, ongoing.  Continue Pepcid as needed.  Mag level stable.

## 2022-08-21 NOTE — Assessment & Plan Note (Signed)
Noted on imaging 2016 and past due for follow-up on this.  Discussed with patient and ordered repeat imaging today.

## 2022-08-21 NOTE — Assessment & Plan Note (Addendum)
Chronic, ongoing.  Continue current medication regimen and adjust as needed.  Lipid panel stable with LDL 70.

## 2022-08-21 NOTE — Assessment & Plan Note (Signed)
Chronic, continue ASA and statin.  Continue collaboration with cardiology, recent notes reviewed.

## 2022-08-21 NOTE — Assessment & Plan Note (Signed)
Continue current medication regimen and collaboration with cardiology.  Monitor NTG use.

## 2022-08-21 NOTE — Progress Notes (Signed)
BP 127/74   Pulse 78   Temp 98 F (36.7 C) (Oral)   Ht 5' 1.02" (1.55 m)   Wt 222 lb 12.8 oz (101.1 kg)   SpO2 98%   BMI 42.06 kg/m    Subjective:    Patient ID: Annette Porter, female    DOB: 06/09/1951, 72 y.o.   MRN: [id number]  HPI: Annette Porter is a 72 y.o. female  Chief Complaint  Patient presents with   Diabetes   Hypertension   Hyperlipidemia   HYPERTENSION / HYPERLIPIDEMIA Followed by cardiology with last visit 08/15/22, no changes made.  Has history of MI February 2000.  Continues on Metoprolol, Imdur, HCTZ, Zetia, and Lipitor.  No recent NTG use.  Imaging in 2016 noted aortic ectasia was to have u/s in 5 years to recheck, on review continues to need this.  Discussed with patient.  History of breast cancer in 2014, had radiation.   Satisfied with current treatment? yes Duration of hypertension: chronic BP monitoring frequency: weekly BP range: 122/66 this morning BP medication side effects: no Duration of hyperlipidemia: chronic Cholesterol medication side effects: no Cholesterol supplements: none Medication compliance: good compliance Aspirin: yes Recent stressors: no Recurrent headaches: no Visual changes: no Palpitations: no Dyspnea: no Chest pain: no Lower extremity edema:  occasional with recent knee surgery 2 weeks ago Dizzy/lightheaded: occasional since recent surgery  HYPOTHYROIDISM Continues on Levothyroxine 125 MCG daily.  Recent labs showed lower TSH level -- she has not tolerated reduction of Levo in past.    She does continue Pepcid for GERD with benefit.  Singulair continues to offer benefit for allergic rhinitis with no sinus infection in years. Thyroid control status:stable Satisfied with current treatment? yes Medication side effects: no Medication compliance: good compliance Etiology of hypothyroidism: unknown Recent dose adjustment:no Fatigue: no Cold intolerance: no Heat intolerance: no Weight gain: no Weight loss:  no Constipation: no Diarrhea/loose stools: no Palpitations: no Lower extremity edema: no Anxiety/depressed mood: no   PREDIABETES A1c in 6.3% August 2023. Polydipsia/polyuria: no Visual disturbance: no Chest pain: no Paresthesias: no  Relevant past medical, surgical, family and social history reviewed and updated as indicated. Interim medical history since our last visit reviewed. Allergies and medications reviewed and updated.  Review of Systems  Constitutional:  Negative for activity change, appetite change, diaphoresis, fatigue and fever.  Respiratory:  Negative for cough, chest tightness and shortness of breath.   Cardiovascular:  Negative for chest pain, palpitations and leg swelling.  Gastrointestinal: Negative.   Endocrine: Negative for cold intolerance, heat intolerance, polydipsia, polyphagia and polyuria.  Neurological:  Negative for dizziness, syncope, weakness, light-headedness, numbness and headaches.  Psychiatric/Behavioral: Negative.      Per HPI unless specifically indicated above     Objective:    BP 127/74   Pulse 78   Temp 98 F (36.7 C) (Oral)   Ht 5' 1.02" (1.55 m)   Wt 222 lb 12.8 oz (101.1 kg)   SpO2 98%   BMI 42.06 kg/m   Wt Readings from Last 3 Encounters:  08/21/22 222 lb 12.8 oz (101.1 kg)  02/06/22 225 lb 9.6 oz (102.3 kg)  08/17/21 225 lb 12.8 oz (102.4 kg)    Physical Exam Vitals and nursing note reviewed.  Constitutional:      General: She is awake. She is not in acute distress.    Appearance: She is well-developed and well-groomed. She is obese. She is not ill-appearing or toxic-appearing.  HENT:     Head:  Normocephalic.     Right Ear: Hearing and external ear normal.     Left Ear: Hearing and external ear normal.  Eyes:     General: Lids are normal.        Right eye: No discharge.        Left eye: No discharge.     Conjunctiva/sclera: Conjunctivae normal.     Pupils: Pupils are equal, round, and reactive to light.  Neck:      Thyroid: No thyromegaly.     Vascular: No carotid bruit.  Cardiovascular:     Rate and Rhythm: Normal rate and regular rhythm.     Heart sounds: Normal heart sounds. No murmur heard.    No gallop.  Pulmonary:     Effort: Pulmonary effort is normal. No accessory muscle usage or respiratory distress.     Breath sounds: Normal breath sounds.  Abdominal:     General: Bowel sounds are normal. There is no distension.     Palpations: Abdomen is soft.     Tenderness: There is no abdominal tenderness.  Musculoskeletal:     Cervical back: Normal range of motion and neck supple.     Right lower leg: No edema.     Left lower leg: No edema.  Lymphadenopathy:     Cervical: No cervical adenopathy.  Skin:    General: Skin is warm and dry.  Neurological:     Mental Status: She is alert and oriented to person, place, and time.  Psychiatric:        Attention and Perception: Attention normal.        Mood and Affect: Mood normal.        Behavior: Behavior normal. Behavior is cooperative.        Thought Content: Thought content normal.        Judgment: Judgment normal.    Results for orders placed or performed in visit on 08/20/22  T4, free  Result Value Ref Range   Free T4 1.67 0.82 - 1.77 ng/dL  Thyroid peroxidase antibody  Result Value Ref Range   Thyroperoxidase Ab SerPl-aCnc 21 0 - 34 IU/mL  Magnesium  Result Value Ref Range   Magnesium 1.6 1.6 - 2.3 mg/dL  VITAMIN D 25 Hydroxy (Vit-D Deficiency, Fractures)  Result Value Ref Range   Vit D, 25-Hydroxy 40.7 30.0 - 100.0 ng/mL  TSH  Result Value Ref Range   TSH 0.197 (L) 0.450 - 4.500 uIU/mL  Lipid Panel w/o Chol/HDL Ratio  Result Value Ref Range   Cholesterol, Total 145 100 - 199 mg/dL   Triglycerides 109 0 - 149 mg/dL   HDL 55 >39 mg/dL   VLDL Cholesterol Cal 20 5 - 40 mg/dL   LDL Chol Calc (NIH) 70 0 - 99 mg/dL  Comprehensive metabolic panel  Result Value Ref Range   Glucose 98 70 - 99 mg/dL   BUN 15 8 - 27 mg/dL    Creatinine, Ser 0.72 0.57 - 1.00 mg/dL   eGFR 89 >59 mL/min/1.73   BUN/Creatinine Ratio 21 12 - 28   Sodium 134 134 - 144 mmol/L   Potassium 4.2 3.5 - 5.2 mmol/L   Chloride 95 (L) 96 - 106 mmol/L   CO2 23 20 - 29 mmol/L   Calcium 9.3 8.7 - 10.3 mg/dL   Total Protein 6.9 6.0 - 8.5 g/dL   Albumin 4.1 3.8 - 4.8 g/dL   Globulin, Total 2.8 1.5 - 4.5 g/dL   Albumin/Globulin Ratio 1.5 1.2 - 2.2  Bilirubin Total 0.3 0.0 - 1.2 mg/dL   Alkaline Phosphatase 85 44 - 121 IU/L   AST 17 0 - 40 IU/L   ALT 18 0 - 32 IU/L  CBC with Differential/Platelet  Result Value Ref Range   WBC 9.9 3.4 - 10.8 x10E3/uL   RBC 4.79 3.77 - 5.28 x10E6/uL   Hemoglobin 14.4 11.1 - 15.9 g/dL   Hematocrit 44.3 34.0 - 46.6 %   MCV 93 79 - 97 fL   MCH 30.1 26.6 - 33.0 pg   MCHC 32.5 31.5 - 35.7 g/dL   RDW 13.1 11.7 - 15.4 %   Platelets 264 150 - 450 x10E3/uL   Neutrophils 69 Not Estab. %   Lymphs 19 Not Estab. %   Monocytes 9 Not Estab. %   Eos 2 Not Estab. %   Basos 1 Not Estab. %   Neutrophils Absolute 6.8 1.4 - 7.0 x10E3/uL   Lymphocytes Absolute 1.9 0.7 - 3.1 x10E3/uL   Monocytes Absolute 0.9 0.1 - 0.9 x10E3/uL   EOS (ABSOLUTE) 0.2 0.0 - 0.4 x10E3/uL   Basophils Absolute 0.1 0.0 - 0.2 x10E3/uL   Immature Granulocytes 0 Not Estab. %   Immature Grans (Abs) 0.0 0.0 - 0.1 x10E3/uL  Microalbumin, Urine Waived  Result Value Ref Range   Microalb, Ur Waived 30 (H) 0 - 19 mg/L   Creatinine, Urine Waived 100 10 - 300 mg/dL   Microalb/Creat Ratio <30 <30 mg/g  Bayer DCA Hb A1c Waived  Result Value Ref Range   HB A1C (BAYER DCA - WAIVED) 6.0 (H) 4.8 - 5.6 %      Assessment & Plan:   Problem List Items Addressed This Visit       Cardiovascular and Mediastinum   Abdominal aortic ectasia ([place]) - Primary    Noted on imaging 2016 and past due for follow-up on this.  Discussed with patient and ordered repeat imaging today.      Relevant Orders   US AORTA DUPLEX COMPLETE   CAD (coronary atherosclerotic  disease)    Chronic, continue ASA and statin.  Continue collaboration with cardiology, recent notes reviewed.      Hypertension    Chronic, ongoing. BP at goal on recheck today and at goal on home readings.  Recommend she monitor BP at least a few mornings a week at home and document.  DASH diet at home.  Continue current medication regimen and adjust as needed.  Labs reviewed with patient and stable.  Continue to collaborate with cardiology, recent notes reviewed.  Return in 6 months.        Digestive   GERD (gastroesophageal reflux disease)    Chronic, ongoing.  Continue Pepcid as needed.  Mag level stable.      Relevant Medications   ondansetron (ZOFRAN-ODT) 4 MG disintegrating tablet     Endocrine   Hypothyroidism    Chronic, ongoing.  Continue current medication regimen and adjust as needed based on thyroid labs.  Recent levels more hyper, but she does not want to adjust medication.  Plan on recheck in 4 weeks, if still low TSH will need to adjust.  Discussed at length with her.      Relevant Orders   T4, free   TSH     Other   History of breast cancer    Continue collaboration with Dr. Bary Castilla, with surgical team, and oncology team.      History of MI (myocardial infarction)    Continue current medication regimen and collaboration  with cardiology.  Monitor NTG use.      Hyperlipidemia    Chronic, ongoing.  Continue current medication regimen and adjust as needed.  Lipid panel stable with LDL 70.       Morbid obesity (HCC)    BMI 43.06 with HTN, HLD, CAD.  Recommended eating smaller high protein, low fat meals more frequently and exercising 30 mins a day 5 times a week with a goal of 10-15lb weight loss in the next 3 months. Patient voiced their understanding and motivation to adhere to these recommendations.         Follow up plan: Return in about 6 months (around 02/21/2023) for HTN/HLD, THYROID, PREDIABETES -- and needs 4 week lab only visit.

## 2022-08-21 NOTE — Assessment & Plan Note (Signed)
Chronic, ongoing. BP at goal on recheck today and at goal on home readings.  Recommend she monitor BP at least a few mornings a week at home and document.  DASH diet at home.  Continue current medication regimen and adjust as needed.  Labs reviewed with patient and stable.  Continue to collaborate with cardiology, recent notes reviewed.  Return in 6 months.

## 2022-08-21 NOTE — Assessment & Plan Note (Signed)
Continue collaboration with Dr. Bary Castilla, with surgical team, and oncology team.

## 2022-08-21 NOTE — Patient Instructions (Signed)

## 2022-08-21 NOTE — Telephone Encounter (Signed)
Pt had an appointment with provider and they discussed filling out Parking Placard Application. Call when ready for pick up.  Placed in provider's folder.

## 2022-08-21 NOTE — Assessment & Plan Note (Signed)
Chronic, ongoing.  Continue current medication regimen and adjust as needed based on thyroid labs.  Recent levels more hyper, but she does not want to adjust medication.  Plan on recheck in 4 weeks, if still low TSH will need to adjust.  Discussed at length with her.

## 2022-08-21 NOTE — Telephone Encounter (Signed)
Form placed in Signature folder for pcp to sign

## 2022-08-21 NOTE — Progress Notes (Signed)
Spoke to patient at visit 08/21/22 and reviewed labs with her.

## 2022-08-22 ENCOUNTER — Other Ambulatory Visit: Payer: Self-pay | Admitting: Physician Assistant

## 2022-08-22 DIAGNOSIS — I10 Essential (primary) hypertension: Secondary | ICD-10-CM

## 2022-08-22 DIAGNOSIS — M25561 Pain in right knee: Secondary | ICD-10-CM | POA: Diagnosis not present

## 2022-08-22 NOTE — Telephone Encounter (Signed)
Called pt to let her know it is ready for pickup, said she would pick it up today after her physical therapy.Marland Kitchen

## 2022-08-22 NOTE — Telephone Encounter (Signed)
Requested by interface surescripts. Requested too soon last refill 06/27/22 #90.  Requested Prescriptions  Refused Prescriptions Disp Refills   hydrochlorothiazide (HYDRODIURIL) 25 MG tablet [Pharmacy Med Name: hydroCHLOROthiazide 25 MG Oral Tablet] 90 tablet 3    Sig: TAKE 1 TABLET BY MOUTH DAILY     Cardiovascular: Diuretics - Thiazide Passed - 08/22/2022  5:16 AM      Passed - Cr in normal range and within 180 days    Creatinine  Date Value Ref Range Status  04/21/2013 0.81 0.60 - 1.30 mg/dL Final   Creatinine, Ser  Date Value Ref Range Status  08/20/2022 0.72 0.57 - 1.00 mg/dL Final         Passed - K in normal range and within 180 days    Potassium  Date Value Ref Range Status  08/20/2022 4.2 3.5 - 5.2 mmol/L Final  04/21/2013 3.7 3.5 - 5.1 mmol/L Final         Passed - Na in normal range and within 180 days    Sodium  Date Value Ref Range Status  08/20/2022 134 134 - 144 mmol/L Final  04/21/2013 141 136 - 145 mmol/L Final         Passed - Last BP in normal range    BP Readings from Last 1 Encounters:  08/21/22 127/74         Passed - Valid encounter within last 6 months    Recent Outpatient Visits           Yesterday Abdominal aortic ectasia ([place])   [place], [place] Screws T, NP   6 months ago Primary hypertension   [place] [facility] Mecum, Dani Gobble, PA-C   1 year ago Hyperlipidemia, unspecified hyperlipidemia type   [place] Vigg, Avanti, MD   1 year ago Primary hypertension   [place] Vigg, Avanti, MD   1 year ago Need for influenza vaccination   [place] Vigg, Avanti, MD       Future Appointments             In 6 months Cannady, Barbaraann Faster, NP [place], [facility]

## 2022-08-24 ENCOUNTER — Other Ambulatory Visit: Payer: Self-pay | Admitting: Nurse Practitioner

## 2022-08-26 DIAGNOSIS — M25561 Pain in right knee: Secondary | ICD-10-CM | POA: Diagnosis not present

## 2022-08-26 NOTE — Telephone Encounter (Signed)
Requested medication (s) are due for refill today: Yes  Requested medication (s) are on the active medication list: Yes  Last refill:  07/29/22  Future visit scheduled: Yes  Notes to clinic:  Pharmacy requests 1 year supply.    Requested Prescriptions  Pending Prescriptions Disp Refills   levothyroxine (SYNTHROID) 125 MCG tablet [Pharmacy Med Name: Levothyroxine Sodium 125 MCG Oral Tablet] 30 tablet 11    Sig: TAKE 1 TABLET BY MOUTH DAILY     Endocrinology:  Hypothyroid Agents Failed - 08/24/2022 10:54 PM      Failed - TSH in normal range and within 360 days    TSH  Date Value Ref Range Status  08/20/2022 0.197 (L) 0.450 - 4.500 uIU/mL Final         Passed - Valid encounter within last 12 months    Recent Outpatient Visits           5 days ago Abdominal aortic ectasia ([place])   [place], [place]rine Screws T, NP   6 months ago Primary hypertension   [place] [facility] Mecum, Dani Gobble, PA-C   1 year ago Hyperlipidemia, unspecified hyperlipidemia type   [place] Vigg, Avanti, MD   1 year ago Primary hypertension   [place] Vigg, Avanti, MD   1 year ago Need for influenza vaccination   [place] Vigg, Avanti, MD       Future Appointments             In 6 months Cannady, Barbaraann Faster, NP [place], [facility]

## 2022-08-29 DIAGNOSIS — M25561 Pain in right knee: Secondary | ICD-10-CM | POA: Diagnosis not present

## 2022-09-02 ENCOUNTER — Other Ambulatory Visit: Payer: Self-pay | Admitting: Nurse Practitioner

## 2022-09-02 NOTE — Telephone Encounter (Unsigned)
Copied from [place] [id number]. Topic: General - Other >> Sep 02, 2022  9:19 AM Everette C wrote: Reason for CRM: Medication Refill - Medication: montelukast (SINGULAIR) 10 MG tablet [id number]  Has the patient contacted their pharmacy? Yes.   (Agent: If no, request that the patient contact the pharmacy for the refill. If patient does not wish to contact the pharmacy document the reason why and proceed with request.) (Agent: If yes, when and what did the pharmacy advise?)  Preferred Pharmacy (with phone number or street name): [facility] ([place], [place] [facility] [address] [place] [place] [address] Phone: [phone] Fax: [phone] Hours: Not open 24 hours   Has the patient been seen for an appointment in the last year OR does the patient have an upcoming appointment? Yes.    Agent: Please be advised that RX refills may take up to 3 business days. We ask that you follow-up with your pharmacy.

## 2022-09-03 DIAGNOSIS — M25561 Pain in right knee: Secondary | ICD-10-CM | POA: Diagnosis not present

## 2022-09-03 MED ORDER — MONTELUKAST SODIUM 10 MG PO TABS: 10.0000 mg | ORAL_TABLET | Freq: Every day | ORAL | 1 refills | Status: DC

## 2022-09-03 NOTE — Telephone Encounter (Signed)
Requested Prescriptions  Pending Prescriptions Disp Refills   montelukast (SINGULAIR) 10 MG tablet 30 tablet 0    Sig: Take 1 tablet (10 mg total) by mouth at bedtime. NEEDS APPOINTMENT FOR FURTHER REFILLS     Pulmonology:  Leukotriene Inhibitors Passed - 09/02/2022 10:58 AM      Passed - Valid encounter within last 12 months    Recent Outpatient Visits           1 week ago Abdominal aortic ectasia ([place])   [place], [place] Screws T, NP   6 months ago Primary hypertension   [place] [facility] Mecum, Dani Gobble, PA-C   1 year ago Hyperlipidemia, unspecified hyperlipidemia type   [place] Vigg, Avanti, MD   1 year ago Primary hypertension   [place] Vigg, Avanti, MD   1 year ago Need for influenza vaccination   [place] Vigg, Avanti, MD       Future Appointments             In 6 months Cannady, Barbaraann Faster, NP [place], [facility]

## 2022-09-05 DIAGNOSIS — M25561 Pain in right knee: Secondary | ICD-10-CM | POA: Diagnosis not present

## 2022-09-10 DIAGNOSIS — M25561 Pain in right knee: Secondary | ICD-10-CM | POA: Diagnosis not present

## 2022-09-12 DIAGNOSIS — M25561 Pain in right knee: Secondary | ICD-10-CM | POA: Diagnosis not present

## 2022-09-18 ENCOUNTER — Other Ambulatory Visit: Payer: Medicare Other

## 2022-09-23 ENCOUNTER — Other Ambulatory Visit: Payer: Medicare Other

## 2022-09-23 DIAGNOSIS — E039 Hypothyroidism, unspecified: Secondary | ICD-10-CM | POA: Diagnosis not present

## 2022-09-24 ENCOUNTER — Other Ambulatory Visit: Payer: Self-pay | Admitting: Nurse Practitioner

## 2022-09-24 ENCOUNTER — Telehealth: Payer: Self-pay | Admitting: Nurse Practitioner

## 2022-09-24 DIAGNOSIS — M25561 Pain in right knee: Secondary | ICD-10-CM | POA: Diagnosis not present

## 2022-09-24 LAB — T4, FREE: Free T4: 1.41 ng/dL (ref 0.82–1.77)

## 2022-09-24 LAB — TSH: TSH: 0.715 u[IU]/mL (ref 0.450–4.500)

## 2022-09-24 MED ORDER — LEVOTHYROXINE SODIUM 125 MCG PO TABS: 125.0000 ug | ORAL_TABLET | Freq: Every day | ORAL | 1 refills | Status: DC

## 2022-09-24 NOTE — Telephone Encounter (Signed)
Pt is calling in because Jolene filled out a form for the pt's handicap placard and put temporary instead of permanent. Pt says Jolene corrected it, put error and signed her name, but the prominent won't take it. Per pt, they need a new form that is corrected. Pt says she can pick up the form tomorrow if Jolene can redo it for her. Please advise.

## 2022-09-24 NOTE — Progress Notes (Signed)
Contacted via MyChart   Thyroid labs have returned in stable range this check.  I have sent in refills on current Levothyroxine dosing.  Any questions?

## 2022-09-25 NOTE — Telephone Encounter (Signed)
Pt came by to pick up the form and it was not in the completed drawer to picked up.  Tammy came from the back stating that she is not sure why the pt was called and who called her since she was just getting it back from the provider.   Pt checked the form to make sure that it was corrected due to the previous one was incorrect and [employer] would not accept it.

## 2022-09-25 NOTE — Telephone Encounter (Signed)
Called patient and let her know that the form is ready for pick up

## 2022-09-25 NOTE — Telephone Encounter (Signed)
Pt called back to check status of request

## 2022-11-21 DIAGNOSIS — L853 Xerosis cutis: Secondary | ICD-10-CM | POA: Diagnosis not present

## 2022-11-21 DIAGNOSIS — L814 Other melanin hyperpigmentation: Secondary | ICD-10-CM | POA: Diagnosis not present

## 2022-11-21 DIAGNOSIS — D2272 Melanocytic nevi of left lower limb, including hip: Secondary | ICD-10-CM | POA: Diagnosis not present

## 2022-11-21 DIAGNOSIS — L820 Inflamed seborrheic keratosis: Secondary | ICD-10-CM | POA: Diagnosis not present

## 2022-11-21 DIAGNOSIS — D2271 Melanocytic nevi of right lower limb, including hip: Secondary | ICD-10-CM | POA: Diagnosis not present

## 2022-11-21 DIAGNOSIS — D2261 Melanocytic nevi of right upper limb, including shoulder: Secondary | ICD-10-CM | POA: Diagnosis not present

## 2022-11-21 DIAGNOSIS — D225 Melanocytic nevi of trunk: Secondary | ICD-10-CM | POA: Diagnosis not present

## 2022-11-21 DIAGNOSIS — D2262 Melanocytic nevi of left upper limb, including shoulder: Secondary | ICD-10-CM | POA: Diagnosis not present

## 2022-11-21 DIAGNOSIS — L578 Other skin changes due to chronic exposure to nonionizing radiation: Secondary | ICD-10-CM | POA: Diagnosis not present

## 2022-11-21 DIAGNOSIS — L538 Other specified erythematous conditions: Secondary | ICD-10-CM | POA: Diagnosis not present

## 2022-11-21 DIAGNOSIS — L821 Other seborrheic keratosis: Secondary | ICD-10-CM | POA: Diagnosis not present

## 2022-12-24 ENCOUNTER — Other Ambulatory Visit: Payer: Self-pay | Admitting: Nurse Practitioner

## 2022-12-25 NOTE — Telephone Encounter (Signed)
Unable to refill per protocol, Rx request is too soon. Last refill 09/03/22 for 90 and 3 refills.  Requested Prescriptions  Pending Prescriptions Disp Refills   montelukast (SINGULAIR) 10 MG tablet [Pharmacy Med Name: Montelukast Sodium 10 MG Oral Tablet] 90 tablet 3    Sig: TAKE 1 TABLET BY MOUTH AT  BEDTIME NEEDS APPOINTMENT FOR  FURTHER REFILLS     Pulmonology:  Leukotriene Inhibitors Passed - 12/24/2022 10:04 PM      Passed - Valid encounter within last 12 months    Recent Outpatient Visits           4 months ago Abdominal aortic ectasia (HCC)   [place] [facility] [place], Dorie Rank, NP   10 months ago Primary hypertension   [place] [facility] Mecum, Oswaldo Conroy, PA-C   1 year ago Hyperlipidemia, unspecified hyperlipidemia type   [place] [facility] Vigg, Avanti, MD   1 year ago Primary hypertension   [place] [facility] Vigg, Avanti, MD   1 year ago Need for influenza vaccination   [place] [facility] Vigg, Avanti, MD       Future Appointments             In 2 months Cannady, Dorie Rank, NP [place] [facility]

## 2023-01-02 ENCOUNTER — Encounter: Payer: Self-pay | Admitting: Family Medicine

## 2023-01-02 ENCOUNTER — Ambulatory Visit (INDEPENDENT_AMBULATORY_CARE_PROVIDER_SITE_OTHER): Payer: Medicare Other | Admitting: Family Medicine

## 2023-01-02 VITALS — BP 131/76 | HR 85 | Temp 98.7°F | Wt 217.6 lb

## 2023-01-02 DIAGNOSIS — J069 Acute upper respiratory infection, unspecified: Secondary | ICD-10-CM | POA: Insufficient documentation

## 2023-01-02 DIAGNOSIS — Z760 Encounter for issue of repeat prescription: Secondary | ICD-10-CM

## 2023-01-02 LAB — RAPID STREP SCREEN (MED CTR MEBANE ONLY): Strep Gp A Ag, IA W/Reflex: NEGATIVE

## 2023-01-02 LAB — CULTURE, GROUP A STREP

## 2023-01-02 MED ORDER — BENZONATATE 100 MG PO CAPS
100.0000 mg | ORAL_CAPSULE | Freq: Two times a day (BID) | ORAL | 0 refills | Status: AC | PRN
Start: 2023-01-02 — End: ?

## 2023-01-02 MED ORDER — MONTELUKAST SODIUM 10 MG PO TABS
10.0000 mg | ORAL_TABLET | Freq: Every day | ORAL | 1 refills | Status: DC
Start: 2023-01-02 — End: 2023-01-02

## 2023-01-02 MED ORDER — MONTELUKAST SODIUM 10 MG PO TABS
10.0000 mg | ORAL_TABLET | Freq: Every day | ORAL | 0 refills | Status: AC
Start: 2023-01-02 — End: ?

## 2023-01-02 NOTE — Progress Notes (Signed)
BP 131/76   Pulse 85   Temp 98.7 F (37.1 C)   Wt 217 lb 9.6 oz (98.7 kg)   SpO2 95%   BMI 41.08 kg/m    Subjective:    Patient ID: Annette Porter, female    DOB: 1950-08-27, 72 y.o.   MRN: [id number]  HPI: SHAUNTAY BRUNELLI is a 72 y.o. female  Chief Complaint  Patient presents with   Fever    States that had a fever yesterday 101.6   Sore Throat    Pt states started feeling drained on Monday then woke up yesterday and everything had got worse.   Cough   Headache   UPPER RESPIRATORY TRACT INFECTION Symptoms started 3 days ago and worsened yesterday. She is also complaining of body aches.  Worst symptom: cough Fever: yes Cough: yes productive Shortness of breath: no Wheezing: no Chest pain: no Chest tightness: no Chest congestion: yes Nasal congestion: yes Runny nose: yes Post nasal drip: no Sneezing: yes Sore throat: yes Swollen glands: no Sinus pressure: no Headache: yes Face pain: no Toothache: no Ear pain: yes "right Ear pressure: no  Eyes red/itching:no Eye drainage/crusting: no  Vomiting: no Rash: no Fatigue: yes Sick contacts: yes grandson has cough from allergies Strep contacts: no  Context: worse Recurrent sinusitis: no Relief with OTC cold/cough medications:  minimal    Treatments attempted:  Alka seltzer day and night x1; it helped her sleep     Relevant past medical, surgical, family and social history reviewed and updated as indicated. Interim medical history since our last visit reviewed. Allergies and medications reviewed and updated.  Review of Systems  Constitutional:  Positive for fever. Negative for chills and fatigue.  HENT:  Positive for congestion, ear pain, rhinorrhea, sneezing and sore throat. Negative for postnasal drip, sinus pressure and sinus pain.   Eyes:  Negative for discharge, redness and itching.  Respiratory:  Positive for cough. Negative for chest tightness, shortness of breath and wheezing.   Cardiovascular:   Negative for chest pain.  Gastrointestinal:  Negative for vomiting.  Skin:  Negative for rash.  Neurological:  Positive for headaches.    Per HPI unless specifically indicated above     Objective:    BP 131/76   Pulse 85   Temp 98.7 F (37.1 C)   Wt 217 lb 9.6 oz (98.7 kg)   SpO2 95%   BMI 41.08 kg/m   Wt Readings from Last 3 Encounters:  01/02/23 217 lb 9.6 oz (98.7 kg)  08/21/22 222 lb 12.8 oz (101.1 kg)  02/06/22 225 lb 9.6 oz (102.3 kg)    Physical Exam Vitals and nursing note reviewed.  Constitutional:      General: She is not in acute distress.    Appearance: Normal appearance. She is not ill-appearing, toxic-appearing or diaphoretic.  HENT:     Head: Normocephalic and atraumatic.     Right Ear: Tympanic membrane, ear canal and external ear normal. There is no impacted cerumen.     Left Ear: Tympanic membrane, ear canal and external ear normal. There is no impacted cerumen.     Nose: Congestion and rhinorrhea present.     Right Turbinates: Swollen and pale.     Left Turbinates: Swollen and pale.     Mouth/Throat:     Mouth: Mucous membranes are moist.     Pharynx: Oropharynx is clear. Posterior oropharyngeal erythema present. No oropharyngeal exudate or postnasal drip.  Eyes:     General: No  scleral icterus.       Right eye: Discharge present.        Left eye: No discharge.     Extraocular Movements: Extraocular movements intact.     Conjunctiva/sclera: Conjunctivae normal.     Pupils: Pupils are equal, round, and reactive to light.  Neck:     Vascular: No carotid bruit.  Cardiovascular:     Rate and Rhythm: Normal rate and regular rhythm.     Pulses: Normal pulses.          Radial pulses are 2+ on the right side and 2+ on the left side.       Posterior tibial pulses are 2+ on the right side and 2+ on the left side.     Heart sounds: Normal heart sounds. No murmur heard.    No friction rub. No gallop.  Pulmonary:     Effort: Pulmonary effort is normal. No  respiratory distress.     Breath sounds: Normal breath sounds. No stridor. No wheezing, rhonchi or rales.  Chest:     Chest wall: No tenderness.  Musculoskeletal:        General: Normal range of motion.     Cervical back: Normal range of motion and neck supple. No rigidity. No muscular tenderness.  Lymphadenopathy:     Cervical: Cervical adenopathy present.  Skin:    General: Skin is warm and dry.     Capillary Refill: Capillary refill takes less than 2 seconds.     Coloration: Skin is not jaundiced or pale.     Findings: No bruising, erythema, lesion or rash.  Neurological:     General: No focal deficit present.     Mental Status: She is alert and oriented to person, place, and time. Mental status is at baseline.     Cranial Nerves: No cranial nerve deficit.     Sensory: No sensory deficit.     Motor: No weakness.     Coordination: Coordination normal.     Gait: Gait normal.     Deep Tendon Reflexes: Reflexes normal.  Psychiatric:        Mood and Affect: Mood normal.        Behavior: Behavior normal.        Thought Content: Thought content normal.        Judgment: Judgment normal.        Assessment & Plan:   Problem List Items Addressed This Visit     Upper respiratory tract infection - Primary    Acute, stable. Strep negative, awaiting COVID results. Tessalon given BID PRN, recommend Coricidin HBP, alternate with tylenol/ibuprofen, warm liquids such as tea and soup, and maintaining hydration. Informed to follow up in 5 days if no symptom improvement, may consider adding antibiotic to regimen.       Relevant Medications   benzonatate (TESSALON) 100 MG capsule   montelukast (SINGULAIR) 10 MG tablet   Other Relevant Orders   Novel Coronavirus, NAA ([facility])   Rapid Strep screen([facility])   Other Visit Diagnoses     Medication refill       Requesting refill on Singulair, medication refilled. Appointment scheduled in September 2024.        Follow up  plan: Return if symptoms worsen or fail to improve.

## 2023-01-02 NOTE — Patient Instructions (Addendum)
Call if no better in 7 days AVOID DM MEDICATIONS; use only Coricidin HBP(day and night) cough medicine since high blood pressure Alternate with Tylenol and ibuprofen for body aches Try warm liquids hot tea and soup Maintain hydration 100 oz of water daily

## 2023-01-02 NOTE — Assessment & Plan Note (Signed)
Acute, stable. Strep negative, awaiting COVID results. Tessalon given BID PRN, recommend Coricidin HBP, alternate with tylenol/ibuprofen, warm liquids such as tea and soup, and maintaining hydration. Informed to follow up in 5 days if no symptom improvement, may consider adding antibiotic to regimen.

## 2023-01-04 ENCOUNTER — Telehealth: Payer: Self-pay | Admitting: Family Medicine

## 2023-01-04 MED ORDER — NIRMATRELVIR/RITONAVIR (PAXLOVID)TABLET: 3.0000 | ORAL_TABLET | Freq: Two times a day (BID) | ORAL | 0 refills | Status: AC

## 2023-01-04 NOTE — Telephone Encounter (Signed)
Got  a call from the nurse call line regarding patient's positive covid results. Came back this AM. Will send in paxlovid.

## 2023-01-09 ENCOUNTER — Other Ambulatory Visit: Payer: Self-pay | Admitting: Nurse Practitioner

## 2023-01-09 DIAGNOSIS — I2510 Atherosclerotic heart disease of native coronary artery without angina pectoris: Secondary | ICD-10-CM

## 2023-01-09 DIAGNOSIS — E785 Hyperlipidemia, unspecified: Secondary | ICD-10-CM

## 2023-01-10 NOTE — Telephone Encounter (Signed)
Requested Prescriptions  Pending Prescriptions Disp Refills   ezetimibe (ZETIA) 10 MG tablet [Pharmacy Med Name: Ezetimibe 10 MG Oral Tablet] 90 tablet 0    Sig: TAKE 1 TABLET BY MOUTH DAILY     Cardiovascular:  Antilipid - Sterol Transport Inhibitors Failed - 01/09/2023 10:40 PM      Failed - Lipid Panel in normal range within the last 12 months    Cholesterol, Total  Date Value Ref Range Status  08/20/2022 145 100 - 199 mg/dL Final   Cholesterol Piccolo, Waived  Date Value Ref Range Status  08/17/2018 133 <200 mg/dL Final    Comment:                            Desirable                <200                         Borderline High      200- 239                         High                     >239    LDL Chol Calc (NIH)  Date Value Ref Range Status  08/20/2022 70 0 - 99 mg/dL Final   HDL  Date Value Ref Range Status  08/20/2022 55 >39 mg/dL Final   Triglycerides  Date Value Ref Range Status  08/20/2022 109 0 - 149 mg/dL Final   Triglycerides Piccolo,Waived  Date Value Ref Range Status  08/17/2018 135 <150 mg/dL Final    Comment:                            Normal                   <150                         Borderline High     150 - 199                         High                200 - 499                         Very High                >499          Passed - AST in normal range and within 360 days    AST  Date Value Ref Range Status  08/20/2022 17 0 - 40 IU/L Final   AST (SGOT) Piccolo, Waived  Date Value Ref Range Status  08/17/2018 28 11 - 38 U/L Final         Passed - ALT in normal range and within 360 days    ALT  Date Value Ref Range Status  08/20/2022 18 0 - 32 IU/L Final   ALT (SGPT) Piccolo, Waived  Date Value Ref Range Status  08/17/2018 25 10 - 47 U/L Final         Passed - Patient is not pregnant  Passed - Valid encounter within last 12 months    Recent Outpatient Visits           1 week ago Upper respiratory tract infection,  unspecified type   [facility] Pear[place], Sherran Needs, NP   4 months ago Abdominal aortic ectasia [facility])   [facility] [place], Dorie Rank, NP   11 months ago Primary hypertension   [place] [facility] Mecum, Oswaldo Conroy, PA-C   1 year ago Hyperlipidemia, unspecified hyperlipidemia type   [place] [facility] Vigg, Avanti, MD   1 year ago Primary hypertension   [place] [facility] Vigg, Avanti, MD       Future Appointments             In 1 month Cannady, Dorie Rank, NP [place] [facility]

## 2023-01-28 ENCOUNTER — Other Ambulatory Visit: Payer: Self-pay | Admitting: Nurse Practitioner

## 2023-01-28 DIAGNOSIS — I10 Essential (primary) hypertension: Secondary | ICD-10-CM

## 2023-01-28 NOTE — Telephone Encounter (Signed)
Medication Refill - Medication:   hydrochlorothiazide (HYDRODIURIL) 25 MG tablet   has 22 pills left but patient is going out of town on next friday 02/07/2023 for almost a month, needs a refill up until appt date on 03/07/2023   Has the patient contacted their pharmacy? Yes.  Advised to contact PCP  Preferred Pharmacy (with phone number or street name):  [facility] [address], [place] - [address] Phone: [phone] Fax: [phone]  Has the patient been seen for an appointment in the last year OR does the patient have an upcoming appointment? YES. F/U on 03/07/2023 with PCP

## 2023-01-29 ENCOUNTER — Other Ambulatory Visit: Payer: Self-pay | Admitting: Nurse Practitioner

## 2023-01-29 MED ORDER — HYDROCHLOROTHIAZIDE 25 MG PO TABS
25.0000 mg | ORAL_TABLET | Freq: Every day | ORAL | 0 refills | Status: AC
Start: 2023-01-29 — End: ?

## 2023-01-29 MED ORDER — LEVOTHYROXINE SODIUM 125 MCG PO TABS: 125.0000 ug | ORAL_TABLET | Freq: Every day | ORAL | 2 refills | Status: DC

## 2023-01-29 NOTE — Telephone Encounter (Signed)
Medication Refill - Medication:  levothyroxine (SYNTHROID) 125 MCG tablet  Has the patient contacted their pharmacy? Yes.   (Agent: If no, request that the patient contact the pharmacy for the refill. If patient does not wish to contact the pharmacy document the reason why and proceed with request.) (Agent: If yes, when and what did the pharmacy advise?)  Preferred Pharmacy (with phone number or street name): [facility] - [place], [place] - [address] [place]  Has the patient been seen for an appointment in the last year OR does the patient have an upcoming appointment? Yes.     Pharamcy called pt due to a change in the levothyroxine (SYNTHROID) 125 MCG tablet  manufacturer. Pt has given her consent to change but needs the dr's approval.  Please call [phone]  Pt states she is going out of town next week and needs to get this order in asap so they can deliver befoe she leaves.

## 2023-01-29 NOTE — Telephone Encounter (Signed)
Pharmacy request approval to change manufacture for mediation.

## 2023-01-29 NOTE — Telephone Encounter (Signed)
Requested Prescriptions  Pending Prescriptions Disp Refills   hydrochlorothiazide (HYDRODIURIL) 25 MG tablet 90 tablet 0    Sig: Take 1 tablet (25 mg total) by mouth daily.     Cardiovascular: Diuretics - Thiazide Passed - 01/28/2023  2:40 PM      Passed - Cr in normal range and within 180 days    Creatinine  Date Value Ref Range Status  04/21/2013 0.81 0.60 - 1.30 mg/dL Final   Creatinine, Ser  Date Value Ref Range Status  08/20/2022 0.72 0.57 - 1.00 mg/dL Final         Passed - K in normal range and within 180 days    Potassium  Date Value Ref Range Status  08/20/2022 4.2 3.5 - 5.2 mmol/L Final  04/21/2013 3.7 3.5 - 5.1 mmol/L Final         Passed - Na in normal range and within 180 days    Sodium  Date Value Ref Range Status  08/20/2022 134 134 - 144 mmol/L Final  04/21/2013 141 136 - 145 mmol/L Final         Passed - Last BP in normal range    BP Readings from Last 1 Encounters:  01/02/23 131/76         Passed - Valid encounter within last 6 months    Recent Outpatient Visits           3 weeks ago Upper respiratory tract infection, unspecified type   [place] [facility] [place], Sherran Needs, NP   5 months ago Abdominal aortic ectasia (HCC)   [place] [facility] [place], Dorie Rank, NP   11 months ago Primary hypertension   [place] [facility] Mecum, Oswaldo Conroy, PA-C   1 year ago Hyperlipidemia, unspecified hyperlipidemia type   [place] [facility] Vigg, Avanti, MD   1 year ago Primary hypertension   [place] [facility] Vigg, Avanti, MD       Future Appointments             In 1 month [place], Dorie Rank, NP [place] [facility]

## 2023-01-29 NOTE — Telephone Encounter (Signed)
Copied from CRM [id number]. Topic: General - Other >> Jan 29, 2023  9:51 AM Dondra Prader A wrote: Reason for CRM: JD with [facility] is calling in regards to pt medication: levothyroxine (SYNTHROID) 125 MCG tablet They are needing approval to change manufactures. The old manufacture is Amnel and the new manufacture is Lupin. Please advise.  Reference Number: [id number]

## 2023-03-02 DIAGNOSIS — R7303 Prediabetes: Secondary | ICD-10-CM | POA: Insufficient documentation

## 2023-03-02 NOTE — Patient Instructions (Signed)
Be Involved in Caring For Your Health:  Taking Medications When medications are taken as directed, they can greatly improve your health. But if they are not taken as prescribed, they may not work. In some cases, not taking them correctly can be harmful. To help ensure your treatment remains effective and safe, understand your medications and how to take them. Bring your medications to each visit for review by your provider.  Your lab results, notes, and after visit summary will be available on My Chart. We strongly encourage you to use this feature. If lab results are abnormal the clinic will contact you with the appropriate steps. If the clinic does not contact you assume the results are satisfactory. You can always view your results on My Chart. If you have questions regarding your health or results, please contact the clinic during office hours. You can also ask questions on My Chart.  We at [facility] are grateful that you chose [place] to provide your care. We strive to provide evidence-based and compassionate care and are always looking for feedback. If you get a survey from the clinic please complete this so we can hear your opinions.  Prediabetes Eating Plan Prediabetes is a condition that causes blood sugar (glucose) levels to be higher than normal. This increases the risk for developing type 2 diabetes (type 2 diabetes mellitus). Working with a health care provider or nutrition specialist (dietitian) to make diet and lifestyle changes can help prevent the onset of diabetes. These changes may help you: Control your blood glucose levels. Improve your cholesterol levels. Manage your blood pressure. What are tips for following this plan? Reading food labels Read food labels to check the amount of fat, salt (sodium), and sugar in prepackaged foods. Avoid foods that have: Saturated fats. Trans fats. Added sugars. Avoid foods that have more than 300 milligrams (mg) of sodium per  serving. Limit your sodium intake to less than 2,300 mg each day. Shopping Avoid buying pre-made and processed foods. Avoid buying drinks with added sugar. Cooking Cook with olive oil. Do not use butter, lard, or ghee. Bake, broil, grill, steam, or boil foods. Avoid frying. Meal planning  Work with your dietitian to create an eating plan that is right for you. This may include tracking how many calories you take in each day. Use a food diary, notebook, or mobile application to track what you eat at each meal. Consider following a Mediterranean diet. This includes: Eating several servings of fresh fruits and vegetables each day. Eating fish at least twice a week. Eating one serving each day of whole grains, beans, nuts, and seeds. Using olive oil instead of other fats. Limiting alcohol. Limiting red meat. Using nonfat or low-fat dairy products. Consider following a plant-based diet. This includes dietary choices that focus on eating mostly vegetables and fruit, grains, beans, nuts, and seeds. If you have high blood pressure, you may need to limit your sodium intake or follow a diet such as the DASH (Dietary Approaches to Stop Hypertension) eating plan. The DASH diet aims to lower high blood pressure. Lifestyle Set weight loss goals with help from your health care team. It is recommended that most people with prediabetes lose 7% of their body weight. Exercise for at least 30 minutes 5 or more days a week. Attend a support group or seek support from a mental health counselor. Take over-the-counter and prescription medicines only as told by your health care provider. What foods are recommended? Fruits Berries. Bananas. Apples. Oranges.  Grapes. Papaya. Mango. Pomegranate. Kiwi. Grapefruit. Cherries. Vegetables Lettuce. Spinach. Peas. Beets. Cauliflower. Cabbage. Broccoli. Carrots. Tomatoes. Squash. Eggplant. Herbs. Peppers. Onions. Cucumbers. Brussels sprouts. Grains Whole grains, such as  whole-wheat or whole-grain breads, crackers, cereals, and pasta. Unsweetened oatmeal. Bulgur. Barley. Quinoa. Brown rice. Corn or whole-wheat flour tortillas or taco shells. Meats and other proteins Seafood. Poultry without skin. Lean cuts of pork and beef. Tofu. Eggs. Nuts. Beans. Dairy Low-fat or fat-free dairy products, such as yogurt, cottage cheese, and cheese. Beverages Water. Tea. Coffee. Sugar-free or diet soda. Seltzer water. Low-fat or nonfat milk. Milk alternatives, such as soy or almond milk. Fats and oils Olive oil. Canola oil. Sunflower oil. Grapeseed oil. Avocado. Walnuts. Sweets and desserts Sugar-free or low-fat pudding. Sugar-free or low-fat ice cream and other frozen treats. Seasonings and condiments Herbs. Sodium-free spices. Mustard. Relish. Low-salt, low-sugar ketchup. Low-salt, low-sugar barbecue sauce. Low-fat or fat-free mayonnaise. The items listed above may not be a complete list of recommended foods and beverages. Contact a dietitian for more information. What foods are not recommended? Fruits Fruits canned with syrup. Vegetables Canned vegetables. Frozen vegetables with butter or cream sauce. Grains Refined white flour and flour products, such as bread, pasta, snack foods, and cereals. Meats and other proteins Fatty cuts of meat. Poultry with skin. Breaded or fried meat. Processed meats. Dairy Full-fat yogurt, cheese, or milk. Beverages Sweetened drinks, such as iced tea and soda. Fats and oils Butter. Lard. Ghee. Sweets and desserts Baked goods, such as cake, cupcakes, pastries, cookies, and cheesecake. Seasonings and condiments Spice mixes with added salt. Ketchup. Barbecue sauce. Mayonnaise. The items listed above may not be a complete list of foods and beverages that are not recommended. Contact a dietitian for more information. Where to find more information American Diabetes Association: www.diabetes.org Summary You may need to make diet and  lifestyle changes to help prevent the onset of diabetes. These changes can help you control blood sugar, improve cholesterol levels, and manage blood pressure. Set weight loss goals with help from your health care team. It is recommended that most people with prediabetes lose 7% of their body weight. Consider following a Mediterranean diet. This includes eating plenty of fresh fruits and vegetables, whole grains, beans, nuts, seeds, fish, and low-fat dairy, and using olive oil instead of other fats. This information is not intended to replace advice given to you by your health care provider. Make sure you discuss any questions you have with your health care provider. Document Revised: 08/26/2019 Document Reviewed: 08/26/2019 Elsevier Patient Education  2024 [employer].

## 2023-03-06 DIAGNOSIS — R0602 Shortness of breath: Secondary | ICD-10-CM | POA: Diagnosis not present

## 2023-03-06 DIAGNOSIS — E782 Mixed hyperlipidemia: Secondary | ICD-10-CM | POA: Diagnosis not present

## 2023-03-06 DIAGNOSIS — I77811 Abdominal aortic ectasia: Secondary | ICD-10-CM | POA: Diagnosis not present

## 2023-03-06 DIAGNOSIS — I10 Essential (primary) hypertension: Secondary | ICD-10-CM | POA: Diagnosis not present

## 2023-03-06 DIAGNOSIS — R079 Chest pain, unspecified: Secondary | ICD-10-CM | POA: Diagnosis not present

## 2023-03-06 DIAGNOSIS — I252 Old myocardial infarction: Secondary | ICD-10-CM | POA: Diagnosis not present

## 2023-03-06 DIAGNOSIS — I25118 Atherosclerotic heart disease of native coronary artery with other forms of angina pectoris: Secondary | ICD-10-CM | POA: Diagnosis not present

## 2023-03-07 ENCOUNTER — Encounter: Payer: Self-pay | Admitting: Nurse Practitioner

## 2023-03-07 ENCOUNTER — Ambulatory Visit (INDEPENDENT_AMBULATORY_CARE_PROVIDER_SITE_OTHER): Payer: Medicare Other | Admitting: Nurse Practitioner

## 2023-03-07 VITALS — BP 136/82 | HR 61 | Temp 97.8°F | Ht 61.02 in | Wt 220.8 lb

## 2023-03-07 DIAGNOSIS — I2510 Atherosclerotic heart disease of native coronary artery without angina pectoris: Secondary | ICD-10-CM

## 2023-03-07 DIAGNOSIS — I77811 Abdominal aortic ectasia: Secondary | ICD-10-CM | POA: Diagnosis not present

## 2023-03-07 DIAGNOSIS — E039 Hypothyroidism, unspecified: Secondary | ICD-10-CM

## 2023-03-07 DIAGNOSIS — I252 Old myocardial infarction: Secondary | ICD-10-CM | POA: Diagnosis not present

## 2023-03-07 DIAGNOSIS — R519 Headache, unspecified: Secondary | ICD-10-CM | POA: Diagnosis not present

## 2023-03-07 DIAGNOSIS — E782 Mixed hyperlipidemia: Secondary | ICD-10-CM | POA: Diagnosis not present

## 2023-03-07 DIAGNOSIS — I10 Essential (primary) hypertension: Secondary | ICD-10-CM

## 2023-03-07 DIAGNOSIS — I2583 Coronary atherosclerosis due to lipid rich plaque: Secondary | ICD-10-CM | POA: Diagnosis not present

## 2023-03-07 DIAGNOSIS — Z23 Encounter for immunization: Secondary | ICD-10-CM

## 2023-03-07 DIAGNOSIS — R7303 Prediabetes: Secondary | ICD-10-CM | POA: Diagnosis not present

## 2023-03-07 MED ORDER — EZETIMIBE 10 MG PO TABS
10.0000 mg | ORAL_TABLET | Freq: Every day | ORAL | 4 refills | Status: DC
Start: 2023-03-07 — End: 2024-02-24

## 2023-03-07 MED ORDER — LEVOTHYROXINE SODIUM 125 MCG PO TABS: 125.0000 ug | ORAL_TABLET | Freq: Every day | ORAL | 4 refills | Status: DC

## 2023-03-07 MED ORDER — MONTELUKAST SODIUM 10 MG PO TABS
10.0000 mg | ORAL_TABLET | Freq: Every day | ORAL | 4 refills | Status: DC
Start: 2023-03-07 — End: 2024-03-16

## 2023-03-07 MED ORDER — HYDROCHLOROTHIAZIDE 25 MG PO TABS
25.0000 mg | ORAL_TABLET | Freq: Every day | ORAL | 4 refills | Status: DC
Start: 2023-03-07 — End: 2024-02-24

## 2023-03-07 NOTE — Assessment & Plan Note (Signed)
Chronic, continue ASA and statin.  Continue collaboration with cardiology, recent notes reviewed.

## 2023-03-07 NOTE — Progress Notes (Signed)
BP 136/82 (BP Location: Right Arm, Patient Position: Sitting, Cuff Size: Large)   Pulse 61   Temp 97.8 F (36.6 C) (Oral)   Ht 5' 1.02" (1.55 m)   Wt 220 lb 12.8 oz (100.2 kg)   SpO2 96%   BMI 41.69 kg/m    Subjective:    Patient ID: Annette Porter, female    DOB: 13-Nov-1950, 72 y.o.   MRN: [id number]  HPI: Annette Porter is a 72 y.o. female  Chief Complaint  Patient presents with   Hypertension   Hyperlipidemia   Hypothyroidism   PreDM   HYPERTENSION / HYPERLIPIDEMIA Continues on Metoprolol, Imdur, HCTZ, Zetia, and Lipitor.  No recent NTG use.  Imaging in 2016 noted aortic ectasia was to have u/s in 5 years to recheck, on review continues to need this - ordered on 08/21/22 but has not attended.  Discussed with patient. Follows with cardiology with last visit 03/06/23, no changes made but is scheduled for stress test.  Has history of MI February 2000.   Has been having headaches that are different from her baseline, gets dizzy with these.  She reports it is not unusual for her to have headaches, but these different. Denies facial drooping, slurred speech, syncope.  Feels it may be from vision as is straining more -- has not had eye exam in some time. Satisfied with current treatment? yes Duration of hypertension: chronic BP monitoring frequency: weekly BP range: 130-140/70-80 range BP medication side effects: no Duration of hyperlipidemia: chronic Cholesterol medication side effects: no Cholesterol supplements: none Medication compliance: good compliance Aspirin: yes Recent stressors: no Recurrent headaches: no Visual changes: no Palpitations: no Dyspnea: no Chest pain: no Lower extremity edema: not recently Dizzy/lightheaded: occasional with headaches The ASCVD Risk score (Arnett DK, et al., 2019) failed to calculate for the following reasons:   The patient has a prior MI or stroke diagnosis  HYPOTHYROIDISM Continues on Levothyroxine 125 MCG daily. Recent labs  stable. Thyroid control status:stable Satisfied with current treatment? yes Medication side effects: no Medication compliance: good compliance Etiology of hypothyroidism: unknown Recent dose adjustment:no Fatigue: no Cold intolerance: no Heat intolerance: no Weight gain: no Weight loss: no Constipation: no Diarrhea/loose stools: no Palpitations: no Lower extremity edema: no Anxiety/depressed mood: no   Impaired Fasting Glucose HbA1C:  Lab Results  Component Value Date   HGBA1C 6.0 (H) 08/20/2022  Polydipsia: no Polyuria: no Weight change: no Visual disturbance: no Glucose Monitoring: no    Accucheck frequency: Not Checking    Fasting glucose:     Post prandial:  Diabetic Education: Not Completed Family history of diabetes: yes  Relevant past medical, surgical, family and social history reviewed and updated as indicated. Interim medical history since our last visit reviewed. Allergies and medications reviewed and updated.  Review of Systems  Constitutional:  Negative for activity change, appetite change, diaphoresis, fatigue and fever.  Respiratory:  Negative for cough, chest tightness and shortness of breath.   Cardiovascular:  Negative for chest pain, palpitations and leg swelling.  Gastrointestinal: Negative.   Endocrine: Negative for cold intolerance, heat intolerance, polydipsia, polyphagia and polyuria.  Neurological:  Positive for headaches. Negative for dizziness, syncope, facial asymmetry, speech difficulty, weakness, light-headedness and numbness.  Psychiatric/Behavioral: Negative.      Per HPI unless specifically indicated above     Objective:    BP 136/82 (BP Location: Right Arm, Patient Position: Sitting, Cuff Size: Large)   Pulse 61   Temp 97.8 F (36.6 C) (Oral)  Ht 5' 1.02" (1.55 m)   Wt 220 lb 12.8 oz (100.2 kg)   SpO2 96%   BMI 41.69 kg/m   Wt Readings from Last 3 Encounters:  03/07/23 220 lb 12.8 oz (100.2 kg)  01/02/23 217 lb 9.6 oz  (98.7 kg)  08/21/22 222 lb 12.8 oz (101.1 kg)    Physical Exam Vitals and nursing note reviewed.  Constitutional:      General: She is awake. She is not in acute distress.    Appearance: She is well-developed and well-groomed. She is obese. She is not ill-appearing or toxic-appearing.  HENT:     Head: Normocephalic.     Right Ear: Hearing and external ear normal.     Left Ear: Hearing and external ear normal.  Eyes:     General: Lids are normal.        Right eye: No discharge.        Left eye: No discharge.     Conjunctiva/sclera: Conjunctivae normal.     Pupils: Pupils are equal, round, and reactive to light.  Neck:     Thyroid: No thyromegaly.     Vascular: No carotid bruit.  Cardiovascular:     Rate and Rhythm: Normal rate and regular rhythm.     Heart sounds: Normal heart sounds. No murmur heard.    No gallop.  Pulmonary:     Effort: Pulmonary effort is normal. No accessory muscle usage or respiratory distress.     Breath sounds: Normal breath sounds.  Abdominal:     General: Bowel sounds are normal. There is no distension.     Palpations: Abdomen is soft.     Tenderness: There is no abdominal tenderness.  Musculoskeletal:     Cervical back: Normal range of motion and neck supple.     Right lower leg: No edema.     Left lower leg: No edema.  Lymphadenopathy:     Cervical: No cervical adenopathy.  Skin:    General: Skin is warm and dry.  Neurological:     Mental Status: She is alert and oriented to person, place, and time.     Cranial Nerves: Cranial nerves 2-12 are intact.     Motor: Motor function is intact.     Coordination: Coordination is intact.     Gait: Gait is intact.     Deep Tendon Reflexes:     Reflex Scores:      Brachioradialis reflexes are 2+ on the right side and 2+ on the left side.      Patellar reflexes are 2+ on the right side and 2+ on the left side. Psychiatric:        Attention and Perception: Attention normal.        Mood and Affect: Mood  normal.        Behavior: Behavior normal. Behavior is cooperative.        Thought Content: Thought content normal.        Judgment: Judgment normal.    Results for orders placed or performed in visit on 01/02/23  Novel Coronavirus, NAA ([facility])   Specimen: Nasopharyngeal(NP) swabs in vial transport medium  Result Value Ref Range   SARS-CoV-2, NAA Detected (A) Not Detected  Rapid Strep screen([facility])   Specimen: Other   Other  Result Value Ref Range   Strep Gp A Ag, IA W/Reflex Negative Negative  Culture, Group A Strep   Other  Result Value Ref Range   Strep A Culture Negative  Assessment & Plan:   Problem List Items Addressed This Visit       Cardiovascular and Mediastinum   Abdominal aortic ectasia (HCC) - Primary    Noted on imaging 2016 and past due for follow-up on this.  Discussed with patient and ordered repeat imaging last visit -- she never heard from anyone to schedule -- will reach out to referral coordinator to see if we can get scheduled.      Relevant Medications   hydrochlorothiazide (HYDRODIURIL) 25 MG tablet   ezetimibe (ZETIA) 10 MG tablet   Other Relevant Orders   Comprehensive metabolic panel   Lipid Panel w/o Chol/HDL Ratio   CAD (coronary atherosclerotic disease)    Chronic, continue ASA and statin.  Continue collaboration with cardiology, recent notes reviewed.      Relevant Medications   hydrochlorothiazide (HYDRODIURIL) 25 MG tablet   ezetimibe (ZETIA) 10 MG tablet   Other Relevant Orders   Comprehensive metabolic panel   Lipid Panel w/o Chol/HDL Ratio   Hypertension    Chronic, ongoing. BP at goal on recheck today and at goal on home readings.  Recommend she monitor BP at least a few mornings a week at home and document.  DASH diet at home.  Continue current medication regimen and adjust as needed.  Labs: CMP.  Continue to collaborate with cardiology, recent notes reviewed.  Recommend if she notices consistent BP above 130/80  at home to alert provider.  Return in 6 months.      Relevant Medications   hydrochlorothiazide (HYDRODIURIL) 25 MG tablet   ezetimibe (ZETIA) 10 MG tablet     Endocrine   Hypothyroidism    Chronic, ongoing.  Continue current medication regimen and adjust as needed based on thyroid labs.  Recheck labs at physical in March.      Relevant Medications   montelukast (SINGULAIR) 10 MG tablet   levothyroxine (SYNTHROID) 125 MCG tablet     Other   Generalized headaches    No red flag symptoms presenting with these. Recommend she obtain eye exam to further assess vision and try to take breaks when using electronics.  If any red flags or worsening return to office.      History of MI (myocardial infarction)    Continue current medication regimen and collaboration with cardiology.  Monitor NTG use.      Relevant Orders   Comprehensive metabolic panel   Lipid Panel w/o Chol/HDL Ratio   Hyperlipidemia    Chronic, ongoing.  Continue current medication regimen and adjust as needed.  Lipid panel today.      Relevant Medications   hydrochlorothiazide (HYDRODIURIL) 25 MG tablet   ezetimibe (ZETIA) 10 MG tablet   Other Relevant Orders   Comprehensive metabolic panel   Lipid Panel w/o Chol/HDL Ratio   Morbid obesity (HCC)    BMI 41.69 with HTN, HLD, CAD.  Recommended eating smaller high protein, low fat meals more frequently and exercising 30 mins a day 5 times a week with a goal of 10-15lb weight loss in the next 3 months. Patient voiced their understanding and motivation to adhere to these recommendations.       Prediabetes    Recent A1c was 6%.  Discussed with her -- she is working on diet and exercise.  Recheck today and start medication as needed.  Educated her on red flag symptoms to report.      Relevant Orders   HgB A1c   Other Visit Diagnoses     Flu  vaccine need       Flu vaccine provided in office today, education given.   Relevant Orders   Flu Vaccine Trivalent High  Dose (Fluad) (Completed)        Follow up plan: Return in about 6 months (around 09/04/2023) for Annual physical after 08/21/23.

## 2023-03-07 NOTE — Assessment & Plan Note (Signed)
No red flag symptoms presenting with these. Recommend she obtain eye exam to further assess vision and try to take breaks when using electronics.  If any red flags or worsening return to office.

## 2023-03-07 NOTE — Assessment & Plan Note (Signed)
Recent A1c was 6%.  Discussed with her -- she is working on diet and exercise.  Recheck today and start medication as needed.  Educated her on red flag symptoms to report.

## 2023-03-07 NOTE — Assessment & Plan Note (Signed)
BMI 41.69 with HTN, HLD, CAD.  Recommended eating smaller high protein, low fat meals more frequently and exercising 30 mins a day 5 times a week with a goal of 10-15lb weight loss in the next 3 months. Patient voiced their understanding and motivation to adhere to these recommendations.

## 2023-03-07 NOTE — Progress Notes (Signed)
Pt was already scheduled for 09/09/2023 @ 9:00 am for her physical in 6 months.

## 2023-03-07 NOTE — Assessment & Plan Note (Signed)
Chronic, ongoing.  Continue current medication regimen and adjust as needed. Lipid panel today. 

## 2023-03-07 NOTE — Assessment & Plan Note (Signed)
Continue current medication regimen and collaboration with cardiology.  Monitor NTG use.

## 2023-03-07 NOTE — Assessment & Plan Note (Signed)
Noted on imaging 2016 and past due for follow-up on this.  Discussed with patient and ordered repeat imaging last visit -- she never heard from anyone to schedule -- will reach out to referral coordinator to see if we can get scheduled.

## 2023-03-07 NOTE — Assessment & Plan Note (Signed)
Chronic, ongoing. BP at goal on recheck today and at goal on home readings.  Recommend she monitor BP at least a few mornings a week at home and document.  DASH diet at home.  Continue current medication regimen and adjust as needed.  Labs: CMP.  Continue to collaborate with cardiology, recent notes reviewed.  Recommend if she notices consistent BP above 130/80 at home to alert provider.  Return in 6 months.

## 2023-03-07 NOTE — Assessment & Plan Note (Signed)
Chronic, ongoing.  Continue current medication regimen and adjust as needed based on thyroid labs.  Recheck labs at physical in March.

## 2023-03-08 LAB — COMPREHENSIVE METABOLIC PANEL
ALT: 24 [IU]/L (ref 0–32)
AST: 24 [IU]/L (ref 0–40)
Albumin: 4 g/dL (ref 3.8–4.8)
Alkaline Phosphatase: 75 [IU]/L (ref 44–121)
BUN/Creatinine Ratio: 14 (ref 12–28)
BUN: 9 mg/dL (ref 8–27)
Bilirubin Total: 0.3 mg/dL (ref 0.0–1.2)
CO2: 24 mmol/L (ref 20–29)
Calcium: 9.1 mg/dL (ref 8.7–10.3)
Chloride: 98 mmol/L (ref 96–106)
Creatinine, Ser: 0.64 mg/dL (ref 0.57–1.00)
Globulin, Total: 2.6 g/dL (ref 1.5–4.5)
Glucose: 93 mg/dL (ref 70–99)
Potassium: 4.2 mmol/L (ref 3.5–5.2)
Sodium: 136 mmol/L (ref 134–144)
Total Protein: 6.6 g/dL (ref 6.0–8.5)
eGFR: 93 mL/min/{1.73_m2} (ref >59)

## 2023-03-08 LAB — LIPID PANEL W/O CHOL/HDL RATIO
Cholesterol, Total: 133 mg/dL (ref 100–199)
HDL: 48 mg/dL (ref >39)
LDL Chol Calc (NIH): 65 mg/dL (ref 0–99)
Triglycerides: 109 mg/dL (ref 0–149)
VLDL Cholesterol Cal: 20 mg/dL (ref 5–40)

## 2023-03-08 LAB — HEMOGLOBIN A1C
Est. average glucose Bld gHb Est-mCnc: 126 mg/dL
Hgb A1c MFr Bld: 6 % — ABNORMAL HIGH (ref 4.8–5.6)

## 2023-03-09 NOTE — Progress Notes (Signed)
Contacted via MyChart   Good day Annette Porter, your labs have returned: - A1c remains 6%, no change from previous visit and still in prediabetes range.  Continue heavy focus on diet and regular exercise. - Kidney function, creatinine and eGFR, remains normal, as is liver function, AST and ALT.  - Cholesterol labs stable.  Continue all current medications.  Any questions? Keep being amazing!!  Thank you for allowing me to participate in your care.  I appreciate you. Kindest regards, Emely Fahy

## 2023-03-12 ENCOUNTER — Ambulatory Visit: Payer: Medicare Other

## 2023-03-14 ENCOUNTER — Other Ambulatory Visit: Payer: Self-pay | Admitting: Nurse Practitioner

## 2023-03-14 ENCOUNTER — Ambulatory Visit
Admission: RE | Admit: 2023-03-14 | Discharge: 2023-03-14 | Disposition: A | Discharge status: Home / Self Care | Payer: Medicare Other | Source: Ambulatory Visit | Attending: Nurse Practitioner | Admitting: Nurse Practitioner

## 2023-03-14 DIAGNOSIS — I77811 Abdominal aortic ectasia: Secondary | ICD-10-CM | POA: Insufficient documentation

## 2023-03-14 DIAGNOSIS — I77819 Aortic ectasia, unspecified site: Secondary | ICD-10-CM | POA: Diagnosis not present

## 2023-03-14 DIAGNOSIS — Z1231 Encounter for screening mammogram for malignant neoplasm of breast: Secondary | ICD-10-CM

## 2023-03-20 DIAGNOSIS — R0789 Other chest pain: Secondary | ICD-10-CM | POA: Diagnosis not present

## 2023-03-20 DIAGNOSIS — R0602 Shortness of breath: Secondary | ICD-10-CM | POA: Diagnosis not present

## 2023-03-26 NOTE — Progress Notes (Signed)
Contacted via MyChart   Good news Annette Porter, your imaging has returned and overall much better with no presence of abdominal aortic aneurysm.  Great news.  We do not need to recheck.  Great news!!

## 2023-04-17 ENCOUNTER — Ambulatory Visit (INDEPENDENT_AMBULATORY_CARE_PROVIDER_SITE_OTHER): Payer: Medicare Other | Admitting: Emergency Medicine

## 2023-04-17 VITALS — BP 128/65 | HR 62 | Ht 62.0 in | Wt 220.0 lb

## 2023-04-17 DIAGNOSIS — Z0000 Encounter for general adult medical examination without abnormal findings: Secondary | ICD-10-CM

## 2023-04-17 NOTE — Patient Instructions (Addendum)
Annette Porter , Thank you for taking time to come for your Medicare Wellness Visit. I appreciate your ongoing commitment to your health goals. Please review the following plan we discussed and let me know if I can assist you in the future.   Referrals/Orders/Follow-Ups/Clinician Recommendations: Keep up the great work!  This is a list of the screening recommended for you and due dates:  Health Maintenance  Topic Date Due   COVID-19 Vaccine (4 - 2023-24 season) 02/09/2023   Mammogram  04/15/2024   Medicare Annual Wellness Visit  04/16/2024   DTaP/Tdap/Td vaccine (3 - Tdap) 12/20/2024   Colon Cancer Screening  05/30/2025   DEXA scan (bone density measurement)  08/29/2030   Flu Shot  Completed   Hepatitis C Screening  Completed   Zoster (Shingles) Vaccine  Completed   HPV Vaccine  Aged Out   Pneumonia Vaccine  Discontinued    Advanced directives: (ACP Link)Information on Advanced Care Planning can be found at [facility] Advance Health Care Directives Advance Health Care Directives ([email or web address]   Once you have completed the forms, please bring a copy of your health care power of attorney and living will to the office to be added to your chart at your convenience.   Next Medicare Annual Wellness Visit scheduled for next year: Yes, 04/29/24 @ 8:40am

## 2023-04-17 NOTE — Progress Notes (Signed)
Subjective:   Annette Porter is a 72 y.o. female who presents for Medicare Annual (Subsequent) preventive examination.  Visit Complete: Virtual I connected with  Annette Porter on 04/17/23 by a audio enabled telemedicine application and verified that I am speaking with the correct person using two identifiers.  Patient Location: Home  Provider Location: Home Office  I discussed the limitations of evaluation and management by telemedicine. The patient expressed understanding and agreed to proceed.  Vital Signs: Because this visit was a virtual/telehealth visit, some criteria may be missing or patient reported. Any vitals not documented were not able to be obtained and vitals that have been documented are patient reported.  Patient Medicare AWV questionnaire was completed by the patient on 04/16/23; I have confirmed that all information answered by patient is correct and no changes since this date.  Cardiac Risk Factors include: advanced age (>58men, >52 women);hypertension;dyslipidemia;obesity (BMI >30kg/m2);Other (see comment), Risk factor comments: prediabetic, CAD     Objective:    Today's Vitals   04/17/23 0855  BP: 128/65  Pulse: 62  Weight: 220 lb (99.8 kg)  Height: 5\' 2"  (1.575 m)   Body mass index is 40.24 kg/m.     04/17/2023    9:07 AM 04/01/2022    8:35 AM 03/30/2021    8:18 AM 03/24/2020    8:19 AM 02/17/2019    8:21 AM 11/30/2018    8:58 AM 01/26/2018    8:26 AM  Advanced Directives  Does Patient Have a Medical Advance Directive? No No No No No No No  Would patient like information on creating a medical advance directive? Yes (MAU/Ambulatory/Procedural Areas - Information given) No - Patient declined    No - Patient declined Yes (MAU/Ambulatory/Procedural Areas - Information given)    Current Medications (verified) Outpatient Encounter Medications as of 04/17/2023  Medication Sig   aspirin 81 MG tablet Take 81 mg by mouth daily.   atorvastatin (LIPITOR) 40  MG tablet TAKE 1 TABLET BY MOUTH DAILY   Calcium Carbonate (CALCIUM 600 PO) Take 1 tablet by mouth 2 (two) times daily.   Cholecalciferol (VITAMIN D3) 1000 UNITS CAPS Take 1 capsule by mouth daily.   docusate sodium (COLACE) 100 MG capsule Take 100 mg by mouth daily.   ezetimibe (ZETIA) 10 MG tablet Take 1 tablet (10 mg total) by mouth daily.   famotidine (PEPCID) 40 MG tablet Take 40 mg by mouth daily.   hydrochlorothiazide (HYDRODIURIL) 25 MG tablet Take 1 tablet (25 mg total) by mouth daily.   isosorbide mononitrate (IMDUR) 30 MG 24 hr tablet Take 30 mg by mouth daily.   isosorbide mononitrate (IMDUR) 60 MG 24 hr tablet TAKE 1 TABLET BY MOUTH ONCE DAILY   levothyroxine (SYNTHROID) 125 MCG tablet Take 1 tablet (125 mcg total) by mouth daily.   metoprolol succinate (TOPROL-XL) 50 MG 24 hr tablet TAKE 1 TABLET BY MOUTH DAILY  WITH OR IMMEDIATELY FOLLOWING A  MEAL   montelukast (SINGULAIR) 10 MG tablet Take 1 tablet (10 mg total) by mouth at bedtime. NEEDS APPOINTMENT FOR FURTHER REFILLS   nitroGLYCERIN (NITROSTAT) 0.4 MG SL tablet Place 0.4 mg under the tongue every 5 (five) minutes as needed for chest pain.   vitamin B-12 (CYANOCOBALAMIN) 250 MCG tablet Take 500 mcg by mouth daily.    No facility-administered encounter medications on file as of 04/17/2023.    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   BREAST LUMPECTOMY Left 12/16/2013   IMC 11 mm ER/ PR positive, Her 2 neu negative, T1c, N0. Mammosite radiation.    BREAST LUMPECTOMY WITH MAMMOSITE CAVITY EVALUATION DEVICE Left 2014   BREAST LUMPECTOMY WITH MAMMOSITE CAVITY EVALUATION DEVICE Left 2015   BREAST MAMMOSITE  05/20/13   BREAST SURGERY Left 04/29/13   wide excision   BREAST SURGERY Left 12-16-13   Wide excision with SN biopsy, INVASIVE MAMMARY CARCINOMA   CARDIAC CATHETERIZATION     COLONOSCOPY  2006   COLONOSCOPY WITH PROPOFOL N/A 05/31/2015   Procedure: COLONOSCOPY WITH PROPOFOL;  Surgeon: [name], MD;  Location: [facility] ENDOSCOPY;  Service: Endoscopy;  Laterality: N/A;   HAND SURGERY     KNEE ARTHROSCOPY WITH MEDIAL MENISECTOMY Left 02/24/2015   Procedure: KNEE ARTHROSCOPY WITH PARTIAL MEDIAL MENISECTOMY;  Surgeon: Myra Rude, MD;  Location: [facility] ORS;  Service: Orthopedics;  Laterality: Left;   Family History  Problem Relation Age of Onset   Kidney disease Mother    Diabetes Mother    Heart disease Mother    Brain cancer Father    Thyroid disease Sister    Thyroid disease Sister    Thyroid disease Sister    Diabetes Maternal Uncle    Thyroid disease Paternal Aunt    Breast cancer Neg Hx    Social History   Socioeconomic History   Marital status: Married    Spouse name: Verdon Cummins   Number of children: 3   Years of education: Not on file   Highest education level: Some college, no degree  Occupational History   Occupation: retired  Tobacco Use   Smoking status: Never   Smokeless tobacco: Never  Vaping Use   Vaping status: Never Used  Substance and Sexual Activity   Alcohol use: No   Drug use: No   Sexual activity: Not Currently  Other Topics Concern   Not on file  Social History Narrative   2  biological sons and 1 step-son   Social Determinants of Health   Financial Resource Strain: Low Risk  (04/16/2023)   Overall Financial Resource Strain (CARDIA)    Difficulty of Paying Living Expenses: Not hard at all  Food Insecurity: No Food Insecurity (04/16/2023)   Hunger Vital Sign    Worried About Running Out of Food in the Last Year: Never true    Ran Out of Food in the Last Year: Never true  Transportation Needs: No Transportation Needs (04/16/2023)   PRAPARE - Administrator, Civil Service (Medical): No    Lack of Transportation (Non-Medical): No  Physical Activity: Inactive (04/17/2023)   Exercise Vital Sign    Days of Exercise per Week: 0 days  Minutes of Exercise per Session: 0 min  Stress: No Stress Concern Present (04/16/2023)   [employer] of Occupational Health - Occupational Stress Questionnaire    Feeling of Stress : Not at all  Social Connections: Socially Integrated (04/16/2023)   Social Connection and Isolation Panel [NHANES]    Frequency of Communication with Friends and Family: More than three times a week    Frequency of Social Gatherings with Friends and Family: Twice a week    Attends Religious Services: More than 4 times per year    Active Member of [employer] or Organizations: Yes    Attends Engineer, structural: More than 4 times per year    Marital Status: Married    Tobacco Counseling Counseling given: Not Answered   Clinical Intake:  Pre-visit preparation completed: Yes  Pain : No/denies pain     BMI - recorded: 40.24 Nutritional Status: BMI > 30  Obese Nutritional Risks: None Diabetes: No  How often do you need to have someone help you when you read instructions, pamphlets, or other written materials from your doctor or pharmacy?: 1 - Never  Interpreter Needed?: No  Information entered by :: Tora Kindred, CMA   Activities of Daily Living    04/16/2023    2:52 PM  In your present state of health, do you have  any difficulty performing the following activities:  Hearing? 0  Vision? 0  Difficulty concentrating or making decisions? 0  Walking or climbing stairs? 0  Dressing or bathing? 0  Doing errands, shopping? 0  Preparing Food and eating ? N  Using the Toilet? N  In the past six months, have you accidently leaked urine? N  Do you have problems with loss of bowel control? N  Managing your Medications? N  Managing your Finances? N  Housekeeping or managing your Housekeeping? N    Patient Care Team: Marjie Skiff, NP as PCP - General (Nurse Practitioner) [name], MD (General Surgery) Steele Sizer, MD (Family Medicine) Carmina Miller, MD as Referring Physician (Radiation Oncology) Christena Deem, MD (Inactive) as Consulting Physician (Gastroenterology) Dalia Heading, MD as Consulting Physician (Cardiology)  Indicate any recent Medical Services you may have received from other than [facility] providers in the past year (date may be approximate).     Assessment:   This is a routine wellness examination for Nijae.  Hearing/Vision screen Hearing Screening - Comments:: Denies hearing loss Vision Screening - Comments:: Gets eye exams   Goals Addressed               This Visit's Progress     Weight (lb) < 200 lb (90.7 kg) (pt-stated)   220 lb (99.8 kg)     Depression Screen    04/17/2023    9:05 AM 03/07/2023    8:28 AM 01/02/2023   10:30 AM 08/21/2022    1:35 PM 04/01/2022    8:42 AM 02/06/2022    8:20 AM 08/17/2021    8:32 AM  PHQ 2/9 Scores  PHQ - 2 Score 0 0 0 0 0 0 0  PHQ- 9 Score 0 1 0 1 0 0 0    Fall Risk    04/16/2023    2:52 PM 03/07/2023    8:27 AM 01/02/2023   10:30 AM 08/21/2022    1:35 PM 04/01/2022    8:35 AM  Fall Risk   Falls in the past year? 0 0 0 0 0  Number falls in past yr: 0  0 0 0 0  Injury with Fall? 0 0 0 0 0  Risk for fall due to : No Fall Risks No Fall Risks No Fall Risks No Fall Risks   Follow up Falls prevention discussed  Falls evaluation completed;Education provided;Falls prevention discussed Falls evaluation completed Falls evaluation completed Education provided;Falls prevention discussed;Falls evaluation completed    MEDICARE RISK AT HOME: Medicare Risk at Home Any stairs in or around the home?: Yes If so, are there any without handrails?: No Home free of loose throw rugs in walkways, pet beds, electrical cords, etc?: Yes Adequate lighting in your home to reduce risk of falls?: Yes Life alert?: No Use of a cane, walker or w/c?: No Grab bars in the bathroom?: Yes Shower chair or bench in shower?: No Elevated toilet seat or a handicapped toilet?: No  TIMED UP AND GO:  Was the test performed?  No    Cognitive Function:        04/17/2023    9:10 AM 04/01/2022    8:36 AM 03/30/2021    8:21 AM 03/24/2020    8:23 AM 01/26/2018    8:28 AM  6CIT Screen  What Year? 0 points 0 points 0 points 0 points 0 points  What month? 0 points 0 points 0 points 0 points 0 points  What time? 0 points 0 points 0 points 0 points 0 points  Count back from 20 0 points 0 points 0 points 0 points 0 points  Months in reverse 0 points 0 points 0 points 0 points 0 points  Repeat phrase 0 points 0 points 0 points 0 points 0 points  Total Score 0 points 0 points 0 points 0 points 0 points    Immunizations Immunization History  Administered Date(s) Administered   Fluad Quad(high Dose 65+) 02/17/2019, 03/27/2020, 03/27/2021, 03/14/2022   Fluad Trivalent(High Dose 65+) 03/07/2023   Influenza, High Dose Seasonal PF 03/19/2016, 03/17/2017, 03/26/2018   Influenza,inj,Quad PF,6+ Mos 03/22/2015   PFIZER(Purple Top)SARS-COV-2 Vaccination 08/16/2019, 09/07/2019, 08/24/2020   Pneumococcal Conjugate-13 12/21/2014   Pneumococcal-Unspecified 04/12/2004, 06/26/2009   Td 04/29/2005, 12/21/2014   Zoster Recombinant(Shingrix) 11/24/2019, 06/07/2020    TDAP status: Up to date  Flu Vaccine status: Up to date  Pneumococcal vaccine  status: Up to date  Covid-19 vaccine status: Declined, Education has been provided regarding the importance of this vaccine but patient still declined. Advised may receive this vaccine at local pharmacy or Health Dept.or vaccine clinic. Aware to provide a copy of the vaccination record if obtained from local pharmacy or Health Dept. Verbalized acceptance and understanding.  Qualifies for Shingles Vaccine? Yes   Zostavax completed No   Shingrix Completed?: Yes  Screening Tests Health Maintenance  Topic Date Due   COVID-19 Vaccine (4 - 2023-24 season) 02/09/2023   MAMMOGRAM  04/15/2024   Medicare Annual Wellness (AWV)  04/16/2024   DTaP/Tdap/Td (3 - Tdap) 12/20/2024   Colonoscopy  05/30/2025   DEXA SCAN  08/29/2030   INFLUENZA VACCINE  Completed   Hepatitis C Screening  Completed   Zoster Vaccines- Shingrix  Completed   HPV VACCINES  Aged Out   Pneumonia Vaccine 34+ Years old  Discontinued    Health Maintenance  Health Maintenance Due  Topic Date Due   COVID-19 Vaccine (4 - 2023-24 season) 02/09/2023    Colorectal cancer screening: Type of screening: Colonoscopy. Completed 05/31/15. Repeat every 10 years  Mammogram status: Completed 04/15/22. Repeat every year. Scheduled 04/21/23  Bone Density status: Completed 08/28/20. Results reflect: Bone density  results: NORMAL. Repeat every 10 years.  Lung Cancer Screening: (Low Dose CT Chest recommended if Age 11-80 years, 20 pack-year currently smoking OR have quit w/in 15years.) does not qualify.   Lung Cancer Screening Referral: n/a  Additional Screening:  Hepatitis C Screening: does not qualify; Completed 01/22/17  Vision Screening: Recommended annual ophthalmology exams for early detection of glaucoma and other disorders of the eye.  Dental Screening: Recommended annual dental exams for proper oral hygiene   Community Resource Referral / Chronic Care Management: CRR required this visit?  No   CCM required this visit?   No     Plan:     I have personally reviewed and noted the following in the patient's chart:   Medical and social history Use of alcohol, tobacco or illicit drugs  Current medications and supplements including opioid prescriptions. Patient is not currently taking opioid prescriptions. Functional ability and status Nutritional status Physical activity Advanced directives List of other physicians Hospitalizations, surgeries, and ER visits in previous 12 months Vitals Screenings to include cognitive, depression, and falls Referrals and appointments  In addition, I have reviewed and discussed with patient certain preventive protocols, quality metrics, and best practice recommendations. A written personalized care plan for preventive services as well as general preventive health recommendations were provided to patient.     Tora Kindred, CMA   04/17/2023   After Visit Summary: (MyChart) Due to this being a telephonic visit, the after visit summary with patients personalized plan was offered to patient via MyChart   Nurse Notes:  Declined Covid vaccine

## 2023-04-21 ENCOUNTER — Ambulatory Visit
Admission: RE | Admit: 2023-04-21 | Discharge: 2023-04-21 | Disposition: A | Discharge status: Home / Self Care | Payer: Medicare Other | Source: Ambulatory Visit | Attending: Nurse Practitioner | Admitting: Nurse Practitioner

## 2023-04-21 DIAGNOSIS — Z1231 Encounter for screening mammogram for malignant neoplasm of breast: Secondary | ICD-10-CM | POA: Diagnosis not present

## 2023-04-22 NOTE — Progress Notes (Signed)
Contacted via MyChart   Normal mammogram, may repeat in one year:)

## 2023-04-28 DIAGNOSIS — Z853 Personal history of malignant neoplasm of breast: Secondary | ICD-10-CM | POA: Diagnosis not present

## 2023-04-30 DIAGNOSIS — E782 Mixed hyperlipidemia: Secondary | ICD-10-CM | POA: Diagnosis not present

## 2023-04-30 DIAGNOSIS — I10 Essential (primary) hypertension: Secondary | ICD-10-CM | POA: Diagnosis not present

## 2023-04-30 DIAGNOSIS — I25118 Atherosclerotic heart disease of native coronary artery with other forms of angina pectoris: Secondary | ICD-10-CM | POA: Diagnosis not present

## 2023-04-30 DIAGNOSIS — I77811 Abdominal aortic ectasia: Secondary | ICD-10-CM | POA: Diagnosis not present

## 2023-04-30 DIAGNOSIS — E119 Type 2 diabetes mellitus without complications: Secondary | ICD-10-CM | POA: Diagnosis not present

## 2023-06-08 ENCOUNTER — Other Ambulatory Visit: Payer: Self-pay | Admitting: Nurse Practitioner

## 2023-06-08 DIAGNOSIS — E785 Hyperlipidemia, unspecified: Secondary | ICD-10-CM

## 2023-06-12 NOTE — Telephone Encounter (Signed)
 Requested Prescriptions  Pending Prescriptions Disp Refills   atorvastatin  (LIPITOR) 40 MG tablet [Pharmacy Med Name: Atorvastatin  Calcium  40 MG Oral Tablet] 90 tablet 3    Sig: TAKE 1 TABLET BY MOUTH DAILY     Cardiovascular:  Antilipid - Statins Failed - 06/12/2023  3:13 PM      Failed - Lipid Panel in normal range within the last 12 months    Cholesterol, Total  Date Value Ref Range Status  03/07/2023 133 100 - 199 mg/dL Final   Cholesterol Piccolo, Waived  Date Value Ref Range Status  08/17/2018 133 <200 mg/dL Final    Comment:                            Desirable                <200                         Borderline High      200- 239                         High                     >239    LDL Chol Calc (NIH)  Date Value Ref Range Status  03/07/2023 65 0 - 99 mg/dL Final   HDL  Date Value Ref Range Status  03/07/2023 48 >39 mg/dL Final   Triglycerides  Date Value Ref Range Status  03/07/2023 109 0 - 149 mg/dL Final   Triglycerides Piccolo,Waived  Date Value Ref Range Status  08/17/2018 135 <150 mg/dL Final    Comment:                            Normal                   <150                         Borderline High     150 - 199                         High                200 - 499                         Very High                >499          Passed - Patient is not pregnant      Passed - Valid encounter within last 12 months    Recent Outpatient Visits           3 months ago Abdominal aortic ectasia (HCC)   [facility] [place], Melanie T, NP   5 months ago Upper respiratory tract infection, unspecified type   [place] [facility] [place], Hyla Givens, NP   9 months ago Abdominal aortic ectasia [facility])   [place] [facility] [place], Jolene T, NP   1 year ago Primary hypertension   [place] [facility] Mecum, Erin E, PA-C   1 year ago Hyperlipidemia, unspecified hyperlipidemia type  [place] [facility] [place], Avanti, MD       Future Appointments             In 2 months Cannady, Jolene T, NP  [facility]

## 2023-08-05 DIAGNOSIS — H2513 Age-related nuclear cataract, bilateral: Secondary | ICD-10-CM | POA: Diagnosis not present

## 2023-08-21 ENCOUNTER — Encounter: Payer: Self-pay | Admitting: Nurse Practitioner

## 2023-08-25 DIAGNOSIS — H2511 Age-related nuclear cataract, right eye: Secondary | ICD-10-CM | POA: Diagnosis not present

## 2023-08-25 DIAGNOSIS — H2512 Age-related nuclear cataract, left eye: Secondary | ICD-10-CM | POA: Diagnosis not present

## 2023-08-31 NOTE — Patient Instructions (Signed)

## 2023-09-02 ENCOUNTER — Other Ambulatory Visit

## 2023-09-04 ENCOUNTER — Ambulatory Visit (INDEPENDENT_AMBULATORY_CARE_PROVIDER_SITE_OTHER): Admitting: Nurse Practitioner

## 2023-09-04 DIAGNOSIS — E039 Hypothyroidism, unspecified: Secondary | ICD-10-CM | POA: Diagnosis not present

## 2023-09-04 DIAGNOSIS — I2583 Coronary atherosclerosis due to lipid rich plaque: Secondary | ICD-10-CM | POA: Diagnosis not present

## 2023-09-04 DIAGNOSIS — R519 Headache, unspecified: Secondary | ICD-10-CM

## 2023-09-04 DIAGNOSIS — E782 Mixed hyperlipidemia: Secondary | ICD-10-CM | POA: Diagnosis not present

## 2023-09-04 DIAGNOSIS — I77811 Abdominal aortic ectasia: Secondary | ICD-10-CM | POA: Diagnosis not present

## 2023-09-04 DIAGNOSIS — I10 Essential (primary) hypertension: Secondary | ICD-10-CM | POA: Diagnosis not present

## 2023-09-04 DIAGNOSIS — R7303 Prediabetes: Secondary | ICD-10-CM | POA: Diagnosis not present

## 2023-09-04 DIAGNOSIS — K219 Gastro-esophageal reflux disease without esophagitis: Secondary | ICD-10-CM | POA: Diagnosis not present

## 2023-09-04 LAB — MICROALBUMIN, URINE WAIVED
Creatinine, Urine Waived: 50 mg/dL (ref 10–300)
Microalb, Ur Waived: 10 mg/L (ref 0–19)

## 2023-09-04 LAB — BAYER DCA HB A1C WAIVED: HB A1C (BAYER DCA - WAIVED): 5.8 % — ABNORMAL HIGH (ref 4.8–5.6)

## 2023-09-04 NOTE — Progress Notes (Signed)
 BP 112/68 (BP Location: Right Arm, Cuff Size: Large)   Pulse (!) 58   Temp 98 F (36.7 C) (Oral)   Ht 5\' 1"  (1.549 m)   Wt 227 lb 9.6 oz (103.2 kg)   SpO2 95%   BMI 43.00 kg/m    Subjective:    Patient ID: Annette Porter, female    DOB: November 13, 1950, 73 y.o.   MRN: [id number]  HPI: Annette Porter is a 73 y.o. female presenting on 09/04/2023 for comprehensive medical examination. Current medical complaints include:none  She currently lives with: husband Menopausal Symptoms: no  HYPERTENSION / HYPERLIPIDEMIA Takes Metoprolol, Imdur, HCTZ, Zetia, and Lipitor. Aortic ectasia was to have u/s in 5 years to recheck, on review continues to need this - ordered on 08/21/22 but has not attended.  Cardiology 04/20/23, had echo performed in October 2024 with EF >55%.  Has history of MI February 2000.  No NTG use since February 6th, took one dose. Satisfied with current treatment? yes Duration of hypertension: chronic BP monitoring frequency: weekly BP range: day before yesterday 114/60 to 70, often in that range BP medication side effects: no Duration of hyperlipidemia: chronic Cholesterol medication side effects: no Cholesterol supplements: none Medication compliance: good compliance Aspirin: yes Recent stressors: no Recurrent headaches: no Visual changes: no Palpitations: no Dyspnea: no Chest pain: no Lower extremity edema: no Dizzy/lightheaded: only when bends down and gets back up  HYPOTHYROIDISM Continues on Levothyroxine 125 MCG daily.  Thyroid control status:stable Satisfied with current treatment? yes Medication side effects: no Medication compliance: good compliance Etiology of hypothyroidism: unknown Recent dose adjustment:no Fatigue: sometimes Cold intolerance: no Heat intolerance: no Weight gain: no Weight loss: no Constipation: no Diarrhea/loose stools: no Palpitations: no Lower extremity edema: no Anxiety/depressed mood: no   GERD Continues daily Pepcid.   History of having "stretch" in past.  She wanted to alert provider she is starting to have some feeling of food getting stuck at times, does not want to see GI yet. GERD control status: stable Satisfied with current treatment? yes Heartburn frequency: none Medication side effects: no  Medication compliance: stable Dysphagia: occasional, see above Odynophagia:  no Hematemesis: no Blood in stool: no EGD: yes   Impaired Fasting Glucose HbA1C:  Lab Results  Component Value Date   HGBA1C 5.8 (H) 09/04/2023  Duration of elevated blood sugar: years Polydipsia: no Polyuria: no Weight change: no Visual disturbance: no Glucose Monitoring: no    Accucheck frequency: Not Checking    Fasting glucose:     Post prandial:  Diabetic Education: Not Completed Family history of diabetes: yes  uncle and mom  Depression Screen done today and results listed below:     09/04/2023   11:09 AM 04/17/2023    9:05 AM 03/07/2023    8:28 AM 01/02/2023   10:30 AM 08/21/2022    1:35 PM  Depression screen PHQ 2/9  Decreased Interest 0 0 0 0 0  Down, Depressed, Hopeless 0 0 0 0 0  PHQ - 2 Score 0 0 0 0 0  Altered sleeping 0 0 0 0 0  Tired, decreased energy 0 0 1 0 1  Change in appetite 0 0 0 0 0  Feeling bad or failure about yourself  0 0 0 0 0  Trouble concentrating 0 0 0 0 0  Moving slowly or fidgety/restless 0 0 0 0 0  Suicidal thoughts 0 0 0 0 0  PHQ-9 Score 0 0 1 0 1  Difficult doing work/chores  Not difficult at all Not difficult at all Not difficult at all Not difficult at all Not difficult at all      09/04/2023   11:09 AM 03/07/2023    8:28 AM 01/02/2023   10:30 AM 08/21/2022    1:35 PM  GAD 7 : Generalized Anxiety Score  Nervous, Anxious, on Edge 0 0 0 0  Control/stop worrying 0 0 0 0  Worry too much - different things 0 0 0 0  Trouble relaxing 0 0 0 0  Restless 0 0 0 0  Easily annoyed or irritable 0 0 0 0  Afraid - awful might happen 0 0 0 0  Total GAD 7 Score 0 0 0 0  Anxiety  Difficulty Not difficult at all Not difficult at all Not difficult at all Not difficult at all      08/21/2022    1:35 PM 01/02/2023   10:30 AM 03/07/2023    8:27 AM 04/16/2023    2:52 PM 09/04/2023   11:09 AM  Fall Risk  Falls in the past year? 0 0 0 0 0  Was there an injury with Fall? 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   BREAST LUMPECTOMY Left 12/16/2013   IMC 11 mm ER/ PR positive, Her 2 neu negative, T1c, N0. Mammosite radiation.    BREAST LUMPECTOMY WITH MAMMOSITE CAVITY EVALUATION DEVICE Left 2014   BREAST LUMPECTOMY WITH MAMMOSITE CAVITY EVALUATION DEVICE Left 2015   BREAST MAMMOSITE  05/20/13   BREAST SURGERY Left 04/29/13   wide excision   BREAST SURGERY Left 12-16-13   Wide excision with SN biopsy, INVASIVE MAMMARY CARCINOMA   CARDIAC CATHETERIZATION     COLONOSCOPY  2006   COLONOSCOPY  WITH PROPOFOL N/A 05/31/2015   Procedure: COLONOSCOPY WITH PROPOFOL;  Surgeon: [name], MD;  Location: [facility] ENDOSCOPY;  Service: Endoscopy;  Laterality: N/A;   HAND SURGERY     KNEE ARTHROSCOPY WITH MEDIAL MENISECTOMY Left 02/24/2015   Procedure: KNEE ARTHROSCOPY WITH PARTIAL MEDIAL MENISECTOMY;  Surgeon: Myra Rude, MD;  Location: [facility] ORS;  Service: Orthopedics;  Laterality: Left;    Medications:  Current Outpatient Medications on File Prior to Visit  Medication Sig   aspirin 81 MG tablet Take 81 mg by mouth daily.   atorvastatin (LIPITOR) 40  MG tablet TAKE 1 TABLET BY MOUTH DAILY   Calcium Carbonate (CALCIUM 600 PO) Take 1 tablet by mouth 2 (two) times daily.   Cholecalciferol (VITAMIN D3) 1000 UNITS CAPS Take 1 capsule by mouth daily.   docusate sodium (COLACE) 100 MG capsule Take 100 mg by mouth daily.   ezetimibe (ZETIA) 10 MG tablet Take 1 tablet (10 mg total) by mouth daily.   famotidine (PEPCID) 40 MG tablet Take 40 mg by mouth daily.   hydrochlorothiazide (HYDRODIURIL) 25 MG tablet Take 1 tablet (25 mg total) by mouth daily.   isosorbide mononitrate (IMDUR) 30 MG 24 hr tablet Take 30 mg by mouth daily.   isosorbide mononitrate (IMDUR) 60 MG 24 hr tablet TAKE 1 TABLET BY MOUTH ONCE DAILY   levothyroxine (SYNTHROID) 125 MCG tablet Take 1 tablet (125 mcg total) by mouth daily.   metoprolol succinate (TOPROL-XL) 50 MG 24 hr tablet TAKE 1 TABLET BY MOUTH DAILY  WITH OR IMMEDIATELY FOLLOWING A  MEAL   montelukast (SINGULAIR) 10 MG tablet Take 1 tablet (10 mg total) by mouth at bedtime. NEEDS APPOINTMENT FOR FURTHER REFILLS   nitroGLYCERIN (NITROSTAT) 0.4 MG SL tablet Place 0.4 mg under the tongue every 5 (five) minutes as needed for chest pain.   vitamin B-12 (CYANOCOBALAMIN) 250 MCG tablet Take 500 mcg by mouth daily.    No current facility-administered medications on file prior to visit.    Allergies:  Allergies  Allergen Reactions   Benazepril Cough   Dilaudid  [Hydromorphone] Nausea And Vomiting   Hydrocodone Itching   Tape Other (See Comments)    irratation    Social History:  Social History   Socioeconomic History   Marital status: Married    Spouse name: Verdon Cummins   Number of children: 3   Years of education: Not on file   Highest education level: Some college, no degree  Occupational History   Occupation: retired  Tobacco Use   Smoking status: Never   Smokeless tobacco: Never  Vaping Use   Vaping status: Never Used  Substance and Sexual Activity   Alcohol use: No   Drug use: No   Sexual activity: Not Currently  Other Topics Concern   Not on file  Social History Narrative   2 biological sons and 1 step-son   Social Drivers of Health   Financial Resource Strain: Low Risk  (09/04/2023)   Overall Financial Resource Strain (CARDIA)    Difficulty of Paying Living Expenses: Not hard at all  Food Insecurity: No Food Insecurity (09/04/2023)   Hunger Vital Sign    Worried About Running Out of Food in the Last Year: Never true    Ran Out of Food in the Last Year: Never true  Transportation Needs: No Transportation Needs (09/04/2023)   PRAPARE - Administrator, Civil Service (Medical): No    Lack of Transportation (Non-Medical): No  Physical Activity: Insufficiently Active (09/04/2023)   Exercise Vital Sign    Days of Exercise per Week: 3 days    Minutes of Exercise per Session: 20 min  Stress: No Stress Concern Present (09/04/2023)   [employer] of Occupational Health - Occupational Stress Questionnaire    Feeling of Stress : Not at all  Social Connections: Socially Integrated (09/04/2023)   Social Connection and Isolation Panel [NHANES]    Frequency of Communication with Friends and Family: More than three times a week    Frequency of Social Gatherings with Friends and Family: Twice a week  Attends Religious Services: More than 4 times per year    Active Member of Clubs or Organizations: No    Attends Tax inspector Meetings: More than 4 times per year    Marital Status: Married  Catering manager Violence: Not At Risk (04/17/2023)   Humiliation, Afraid, Rape, and Kick questionnaire    Fear of Current or Ex-Partner: No    Emotionally Abused: No    Physically Abused: No    Sexually Abused: No   Social History   Tobacco Use  Smoking Status Never  Smokeless Tobacco Never   Social History   Substance and Sexual Activity  Alcohol Use No    Family History:  Family History  Problem Relation Age of Onset   Kidney disease Mother    Diabetes Mother    Heart disease Mother    Brain cancer Father    Thyroid disease Sister    Thyroid disease Sister    Thyroid disease Sister    Diabetes Maternal Uncle    Thyroid disease Paternal Aunt    Breast cancer Neg Hx     Past medical history, surgical history, medications, allergies, family history and social history reviewed with patient today and changes made to appropriate areas of the chart.   ROS All other ROS negative except what is listed above and in the HPI.      Objective:    BP 112/68 (BP Location: Right Arm, Cuff Size: Large)   Pulse (!) 58   Temp 98 F (36.7 C) (Oral)   Ht 5\' 1"  (1.549 m)   Wt 227 lb 9.6 oz (103.2 kg)   SpO2 95%   BMI 43.00 kg/m   Wt Readings from Last 3 Encounters:  09/04/23 227 lb 9.6 oz (103.2 kg)  04/17/23 220 lb (99.8 kg)  03/07/23 220 lb 12.8 oz (100.2 kg)    Physical Exam Vitals and nursing note reviewed. Exam conducted with a chaperone present.  Constitutional:      General: She is awake. She is not in acute distress.    Appearance: She is well-developed and well-groomed. She is not ill-appearing or toxic-appearing.  HENT:     Head: Normocephalic and atraumatic.     Right Ear: Hearing, tympanic membrane, ear canal and external ear normal. No drainage.     Left Ear: Hearing, tympanic membrane, ear canal and external ear normal. No drainage.     Nose: Nose normal.     Right Sinus: No  maxillary sinus tenderness or frontal sinus tenderness.     Left Sinus: No maxillary sinus tenderness or frontal sinus tenderness.     Mouth/Throat:     Mouth: Mucous membranes are moist.     Pharynx: Oropharynx is clear. Uvula midline. No pharyngeal swelling, oropharyngeal exudate or posterior oropharyngeal erythema.  Eyes:     General: Lids are normal.        Right eye: No discharge.        Left eye: No discharge.     Extraocular Movements: Extraocular movements intact.     Conjunctiva/sclera: Conjunctivae normal.     Pupils: Pupils are equal, round, and reactive to light.     Visual Fields: Right eye visual fields normal and left eye visual fields normal.  Neck:     Thyroid: No thyromegaly.     Vascular: No carotid bruit.     Trachea: Trachea normal.  Cardiovascular:     Rate and Rhythm: Normal rate and regular rhythm.     Heart  sounds: Normal heart sounds. No murmur heard.    No gallop.  Pulmonary:     Effort: Pulmonary effort is normal. No accessory muscle usage or respiratory distress.     Breath sounds: Normal breath sounds.  Chest:  Breasts:    Right: Normal.     Left: Normal.  Abdominal:     General: Bowel sounds are normal.     Palpations: Abdomen is soft. There is no hepatomegaly or splenomegaly.     Tenderness: There is no abdominal tenderness.  Musculoskeletal:        General: Normal range of motion.     Cervical back: Normal range of motion and neck supple.     Right lower leg: No edema.     Left lower leg: No edema.  Lymphadenopathy:     Head:     Right side of head: No submental, submandibular, tonsillar, preauricular or posterior auricular adenopathy.     Left side of head: No submental, submandibular, tonsillar, preauricular or posterior auricular adenopathy.     Cervical: No cervical adenopathy.     Upper Body:     Right upper body: No supraclavicular, axillary or pectoral adenopathy.     Left upper body: No supraclavicular, axillary or pectoral  adenopathy.  Skin:    General: Skin is warm and dry.     Capillary Refill: Capillary refill takes less than 2 seconds.     Findings: No rash.  Neurological:     Mental Status: She is alert and oriented to person, place, and time.     Gait: Gait is intact.     Deep Tendon Reflexes: Reflexes are normal and symmetric.     Reflex Scores:      Brachioradialis reflexes are 2+ on the right side and 2+ on the left side.      Patellar reflexes are 2+ on the right side and 2+ on the left side. Psychiatric:        Attention and Perception: Attention normal.        Mood and Affect: Mood normal.        Speech: Speech normal.        Behavior: Behavior normal. Behavior is cooperative.        Thought Content: Thought content normal.        Judgment: Judgment normal.    Results for orders placed or performed in visit on 09/04/23  Bayer DCA Hb A1c Waived   Collection Time: 09/04/23 11:13 AM  Result Value Ref Range   HB A1C (BAYER DCA - WAIVED) 5.8 (H) 4.8 - 5.6 %  Microalbumin, Urine Waived   Collection Time: 09/04/23 11:13 AM  Result Value Ref Range   Microalb, Ur Waived 10 0 - 19 mg/L   Creatinine, Urine Waived 50 10 - 300 mg/dL   Microalb/Creat Ratio 30-300 (H) <30 mg/g      Assessment & Plan:   Problem List Items Addressed This Visit       Cardiovascular and Mediastinum   Abdominal aortic ectasia (HCC)   Noted on imaging 2016, but imaging on 03/14/23 showed no aneurysm.      CAD (coronary atherosclerotic disease)   Chronic, continue ASA and statin.  Continue collaboration with cardiology, recent notes reviewed.      Hypertension   Chronic, ongoing. BP at goal in office and at home.  Recommend she monitor BP at least a few mornings a week at home and document.  DASH diet at home.  Continue current medication regimen  and adjust as needed.  Labs: CBC, TSH, CMP, urine ALB.  Continue to collaborate with cardiology, recent notes reviewed.  Recommend if she notices consistent BP above  130/80 at home to alert provider.  Return in 6 months.      Relevant Orders   CBC with Differential/Platelet   Comprehensive metabolic panel     Digestive   GERD (gastroesophageal reflux disease)   Chronic, ongoing.  Continue Pepcid as needed.  Mag level annually.  She will alert provider if any increased dysphagia issues and then will get into GI.      Relevant Orders   Magnesium     Endocrine   Hypothyroidism   Chronic, ongoing.  Continue current medication regimen and adjust as needed based on thyroid labs.  Recheck labs today.      Relevant Orders   TSH   T4, free     Other   Hyperlipidemia   Chronic, ongoing.  Continue current medication regimen and adjust as needed.  Lipid panel today.      Relevant Orders   Comprehensive metabolic panel   Lipid Panel w/o Chol/HDL Ratio   Morbid obesity (HCC) - Primary   BMI 43.00 with HTN, HLD, CAD.  Recommended eating smaller high protein, low fat meals more frequently and exercising 30 mins a day 5 times a week with a goal of 10-15lb weight loss in the next 3 months. Patient voiced their understanding and motivation to adhere to these recommendations.       Prediabetes   A1c trend down to 5.8% today, previous 6%.  Discussed with her -- she is working on diet and exercise.  Start medication as needed.  Educated her on red flag symptoms to report.      Relevant Orders   Bayer DCA Hb A1c Waived (Completed)   Microalbumin, Urine Waived     Follow up plan: Return in about 6 months (around 03/06/2024) for HTN/HLD, IFG, GERD.   LABORATORY TESTING:  - Pap smear: not applicable  IMMUNIZATIONS:   - Tdap: Tetanus vaccination status reviewed: last tetanus booster within 10 years. - Influenza: Up to date - Pneumovax: Up to date - Prevnar: Up to date - COVID: Up to date - HPV: Not applicable - Shingrix vaccine: Up to date  SCREENING: -Mammogram: Up to date last November 2024 - Colonoscopy: Up to date  - Bone Density: Up to  date up to date -- next in 2032 -Hearing Test: Not applicable  -Spirometry: Not applicable   PATIENT COUNSELING:   Advised to take 1 mg of folate supplement per day if capable of pregnancy.   Sexuality: Discussed sexually transmitted diseases, partner selection, use of condoms, avoidance of unintended pregnancy  and contraceptive alternatives.   Advised to avoid cigarette smoking.  I discussed with the patient that most people either abstain from alcohol or drink within safe limits (<=14/week and <=4 drinks/occasion for males, <=7/weeks and <= 3 drinks/occasion for females) and that the risk for alcohol disorders and other health effects rises proportionally with the number of drinks per week and how often a drinker exceeds daily limits.  Discussed cessation/primary prevention of drug use and availability of treatment for abuse.   Diet: Encouraged to adjust caloric intake to maintain  or achieve ideal body weight, to reduce intake of dietary saturated fat and total fat, to limit sodium intake by avoiding high sodium foods and not adding table salt, and to maintain adequate dietary potassium and calcium preferably from fresh fruits, vegetables, and  low-fat dairy products.    Stressed the importance of regular exercise  Injury prevention: Discussed safety belts, safety helmets, smoke detector, smoking near bedding or upholstery.   Dental health: Discussed importance of regular tooth brushing, flossing, and dental visits.    NEXT PREVENTATIVE PHYSICAL DUE IN 1 YEAR. Return in about 6 months (around 03/06/2024) for HTN/HLD, IFG, GERD.

## 2023-09-04 NOTE — Assessment & Plan Note (Signed)
 Chronic, ongoing.  Continue current medication regimen and adjust as needed. Lipid panel today.

## 2023-09-04 NOTE — Assessment & Plan Note (Signed)
 Chronic, ongoing. BP at goal in office and at home.  Recommend she monitor BP at least a few mornings a week at home and document.  DASH diet at home.  Continue current medication regimen and adjust as needed.  Labs: CBC, TSH, CMP, urine ALB.  Continue to collaborate with cardiology, recent notes reviewed.  Recommend if she notices consistent BP above 130/80 at home to alert provider.  Return in 6 months.

## 2023-09-04 NOTE — Assessment & Plan Note (Signed)
 BMI 43.00 with HTN, HLD, CAD.  Recommended eating smaller high protein, low fat meals more frequently and exercising 30 mins a day 5 times a week with a goal of 10-15lb weight loss in the next 3 months. Patient voiced their understanding and motivation to adhere to these recommendations.

## 2023-09-04 NOTE — Assessment & Plan Note (Signed)
 Chronic, ongoing.  Continue Pepcid as needed.  Mag level annually.  She will alert provider if any increased dysphagia issues and then will get into GI.

## 2023-09-04 NOTE — Assessment & Plan Note (Signed)
 Chronic, ongoing.  Continue current medication regimen and adjust as needed based on thyroid labs.  Recheck labs today.

## 2023-09-04 NOTE — Assessment & Plan Note (Signed)
 Noted on imaging 2016, but imaging on 03/14/23 showed no aneurysm.

## 2023-09-04 NOTE — Assessment & Plan Note (Signed)
 A1c trend down to 5.8% today, previous 6%.  Discussed with her -- she is working on diet and exercise.  Start medication as needed.  Educated her on red flag symptoms to report.

## 2023-09-04 NOTE — Assessment & Plan Note (Signed)
Chronic, continue ASA and statin.  Continue collaboration with cardiology, recent notes reviewed.

## 2023-09-05 ENCOUNTER — Encounter: Payer: Self-pay | Admitting: Nurse Practitioner

## 2023-09-05 LAB — COMPREHENSIVE METABOLIC PANEL WITH GFR
ALT: 21 IU/L (ref 0–32)
AST: 21 IU/L (ref 0–40)
Albumin: 4.1 g/dL (ref 3.8–4.8)
Alkaline Phosphatase: 84 IU/L (ref 44–121)
BUN/Creatinine Ratio: 19 (ref 12–28)
BUN: 13 mg/dL (ref 8–27)
Bilirubin Total: 0.4 mg/dL (ref 0.0–1.2)
CO2: 24 mmol/L (ref 20–29)
Calcium: 9.3 mg/dL (ref 8.7–10.3)
Chloride: 97 mmol/L (ref 96–106)
Creatinine, Ser: 0.68 mg/dL (ref 0.57–1.00)
Globulin, Total: 2.6 g/dL (ref 1.5–4.5)
Glucose: 91 mg/dL (ref 70–99)
Potassium: 4.3 mmol/L (ref 3.5–5.2)
Sodium: 134 mmol/L (ref 134–144)
Total Protein: 6.7 g/dL (ref 6.0–8.5)
eGFR: 92 mL/min/{1.73_m2} (ref >59)

## 2023-09-05 LAB — CBC WITH DIFFERENTIAL/PLATELET
Basophils Absolute: 0.1 10*3/uL (ref 0.0–0.2)
Basos: 1 %
EOS (ABSOLUTE): 0.2 10*3/uL (ref 0.0–0.4)
Eos: 2 %
Hematocrit: 42.9 % (ref 34.0–46.6)
Hemoglobin: 14.6 g/dL (ref 11.1–15.9)
Immature Grans (Abs): 0 10*3/uL (ref 0.0–0.1)
Immature Granulocytes: 0 %
Lymphocytes Absolute: 2.1 10*3/uL (ref 0.7–3.1)
Lymphs: 25 %
MCH: 30.7 pg (ref 26.6–33.0)
MCHC: 34 g/dL (ref 31.5–35.7)
MCV: 90 fL (ref 79–97)
Monocytes Absolute: 0.7 10*3/uL (ref 0.1–0.9)
Monocytes: 8 %
Neutrophils Absolute: 5.3 10*3/uL (ref 1.4–7.0)
Neutrophils: 64 %
Platelets: 242 10*3/uL (ref 150–450)
RBC: 4.75 x10E6/uL (ref 3.77–5.28)
RDW: 12.7 % (ref 11.7–15.4)
WBC: 8.4 10*3/uL (ref 3.4–10.8)

## 2023-09-05 LAB — T4, FREE: Free T4: 1.61 ng/dL (ref 0.82–1.77)

## 2023-09-05 LAB — LIPID PANEL W/O CHOL/HDL RATIO
Cholesterol, Total: 138 mg/dL (ref 100–199)
HDL: 50 mg/dL (ref >39)
LDL Chol Calc (NIH): 66 mg/dL (ref 0–99)
Triglycerides: 122 mg/dL (ref 0–149)
VLDL Cholesterol Cal: 22 mg/dL (ref 5–40)

## 2023-09-05 LAB — MAGNESIUM: Magnesium: 1.8 mg/dL (ref 1.6–2.3)

## 2023-09-05 LAB — TSH: TSH: 1.13 u[IU]/mL (ref 0.450–4.500)

## 2023-09-05 NOTE — Progress Notes (Signed)
 Contacted via MyChart   Good evening Annette Porter, your labs have returned and overall are nice and stable.  No medication changes needed.  Any questions? Keep being amazing!!  Thank you for allowing me to participate in your care.  I appreciate you. Kindest regards, Umar Patmon

## 2023-09-09 ENCOUNTER — Encounter: Payer: Self-pay | Admitting: Nurse Practitioner

## 2023-09-12 DIAGNOSIS — H2511 Age-related nuclear cataract, right eye: Secondary | ICD-10-CM | POA: Diagnosis not present

## 2023-09-12 DIAGNOSIS — E039 Hypothyroidism, unspecified: Secondary | ICD-10-CM | POA: Diagnosis not present

## 2023-09-12 DIAGNOSIS — I10 Essential (primary) hypertension: Secondary | ICD-10-CM | POA: Diagnosis not present

## 2023-10-03 DIAGNOSIS — I10 Essential (primary) hypertension: Secondary | ICD-10-CM | POA: Diagnosis not present

## 2023-10-03 DIAGNOSIS — H2512 Age-related nuclear cataract, left eye: Secondary | ICD-10-CM | POA: Diagnosis not present

## 2023-10-03 DIAGNOSIS — E039 Hypothyroidism, unspecified: Secondary | ICD-10-CM | POA: Diagnosis not present

## 2023-11-18 DIAGNOSIS — I25118 Atherosclerotic heart disease of native coronary artery with other forms of angina pectoris: Secondary | ICD-10-CM | POA: Diagnosis not present

## 2023-11-18 DIAGNOSIS — E782 Mixed hyperlipidemia: Secondary | ICD-10-CM | POA: Diagnosis not present

## 2023-11-18 DIAGNOSIS — I10 Essential (primary) hypertension: Secondary | ICD-10-CM | POA: Diagnosis not present

## 2023-12-02 DIAGNOSIS — D225 Melanocytic nevi of trunk: Secondary | ICD-10-CM | POA: Diagnosis not present

## 2023-12-02 DIAGNOSIS — D2261 Melanocytic nevi of right upper limb, including shoulder: Secondary | ICD-10-CM | POA: Diagnosis not present

## 2023-12-02 DIAGNOSIS — D2262 Melanocytic nevi of left upper limb, including shoulder: Secondary | ICD-10-CM | POA: Diagnosis not present

## 2023-12-02 DIAGNOSIS — L853 Xerosis cutis: Secondary | ICD-10-CM | POA: Diagnosis not present

## 2023-12-02 DIAGNOSIS — D2271 Melanocytic nevi of right lower limb, including hip: Secondary | ICD-10-CM | POA: Diagnosis not present

## 2023-12-02 DIAGNOSIS — D2272 Melanocytic nevi of left lower limb, including hip: Secondary | ICD-10-CM | POA: Diagnosis not present

## 2023-12-02 DIAGNOSIS — L821 Other seborrheic keratosis: Secondary | ICD-10-CM | POA: Diagnosis not present

## 2023-12-18 ENCOUNTER — Ambulatory Visit: Payer: Self-pay

## 2023-12-18 NOTE — Telephone Encounter (Signed)
 Reason for Disposition . [1] Breast pain AND [2] cause is not known  Answer Assessment - Initial Assessment Questions 1. SYMPTOM: What's the main symptom you're concerned about?  (e.g., lump, nipple discharge, pain, rash)     Discomfort  2. LOCATION: Where is the sx located?     L breast  3. ONSET: When did sx  start?     Several weeks now 4. PRIOR HISTORY: Do you have any history of prior problems with your breasts? (e.g., breast cancer, breast implant, fibrocystic breast disease)     Had surgery on L breast back in 2015 6. OTHER SYMPTOMS: Do you have any other symptoms? (e.g., breast pain, fever, nipple discharge, redness or rash)     Tenderness when rubbing against it  Protocols used: Breast Symptoms-A-AH

## 2023-12-18 NOTE — Telephone Encounter (Signed)
 Pt is having discomfort in left breast, she has had surgery there twice already. Last surgery was in 2015, rating it a pain level of 4.  Discomfort has been going on for a few weeks, but noticing it more within the last 2-3 weeks.   Voicemail left for patient to call back to Nurse Triage.

## 2023-12-18 NOTE — Telephone Encounter (Addendum)
 FYI Only or Action Required?: FYI only for provider.  Patient was last seen in primary care on 09/04/2023 by Cannady, Jolene T, NP.  Called Nurse Triage reporting Breast Pain.  Symptoms began several weeks ago.  Interventions attempted: OTC medications: Tylenol .  Symptoms are: stable.  Triage Disposition: See PCP Within 2 Weeks  Patient/caregiver understands and will follow disposition?: Yes  Pt has hx of breast CA and surgery in 2015, having L breast discomfort and concerned. Advised 1st appt for 12/23/23, scheduled appt and advised to call back if sx get worse  Message from [facility] sent at 12/18/2023  8:45 AM EDT  Pt is having discomfort in left breast, she has had surgery there twice already. Last surgery was in 2015, rating it a pain level of 4.  Discomfort has been going on for a few weeks, but noticing it more within the last 2-3 weeks.

## 2023-12-23 ENCOUNTER — Ambulatory Visit (INDEPENDENT_AMBULATORY_CARE_PROVIDER_SITE_OTHER): Admitting: Nurse Practitioner

## 2023-12-23 VITALS — BP 130/75 | HR 56 | Temp 97.8°F | Wt 227.0 lb

## 2023-12-23 DIAGNOSIS — N644 Mastodynia: Secondary | ICD-10-CM | POA: Diagnosis not present

## 2023-12-23 NOTE — Progress Notes (Signed)
 BP 130/75 Comment: home blood pressure reading  Pulse (!) 56   Temp 97.8 F (36.6 C) (Oral)   Wt 227 lb (103 kg)   SpO2 98%   BMI 42.89 kg/m    Subjective:    Patient ID: Annette Porter, female    DOB: 1950-06-25, 73 y.o.   MRN: [id number]  HPI: [name] is a 73 y.o. female  Chief Complaint  Patient presents with   Breast Problem    Wants to get a mammo due to her history with breast cancer    Patient presents to clinic with complaints of left breast discomfort.  She can move her left arm and rub and it causes some discomfort in the left breast.  Symptoms have improved in the last couple of days.  She has a history of breast cancer.  She would like to get another mammogram.  Denies any discharge or fevers.      Relevant past medical, surgical, family and social history reviewed and updated as indicated. Interim medical history since our last visit reviewed. Allergies and medications reviewed and updated.  Review of Systems  Musculoskeletal:        Left breast discomfort    Per HPI unless specifically indicated above     Objective:    BP 130/75 Comment: home blood pressure reading  Pulse (!) 56   Temp 97.8 F (36.6 C) (Oral)   Wt 227 lb (103 kg)   SpO2 98%   BMI 42.89 kg/m   Wt Readings from Last 3 Encounters:  12/23/23 227 lb (103 kg)  09/04/23 227 lb 9.6 oz (103.2 kg)  04/17/23 220 lb (99.8 kg)    Physical Exam Vitals and nursing note reviewed.  Constitutional:      General: She is not in acute distress.    Appearance: Normal appearance. She is not ill-appearing, toxic-appearing or diaphoretic.  HENT:     Head: Normocephalic.     Right Ear: External ear normal.     Left Ear: External ear normal.     Nose: Nose normal.     Mouth/Throat:     Mouth: Mucous membranes are moist.     Pharynx: Oropharynx is clear.  Eyes:     General:        Right eye: No discharge.        Left eye: No discharge.     Extraocular Movements: Extraocular movements  intact.     Conjunctiva/sclera: Conjunctivae normal.     Pupils: Pupils are equal, round, and reactive to light.  Cardiovascular:     Rate and Rhythm: Normal rate and regular rhythm.     Heart sounds: No murmur heard. Pulmonary:     Effort: Pulmonary effort is normal. No respiratory distress.     Breath sounds: Normal breath sounds. No wheezing or rales.  Musculoskeletal:     Cervical back: Normal range of motion and neck supple.  Skin:    General: Skin is warm and dry.     Capillary Refill: Capillary refill takes less than 2 seconds.  Neurological:     General: No focal deficit present.     Mental Status: She is alert and oriented to person, place, and time. Mental status is at baseline.  Psychiatric:        Mood and Affect: Mood normal.        Behavior: Behavior normal.        Thought Content: Thought content normal.  Judgment: Judgment normal.     Results for orders placed or performed in visit on 09/04/23  Bayer DCA Hb A1c Waived   Collection Time: 09/04/23 11:13 AM  Result Value Ref Range   HB A1C (BAYER DCA - WAIVED) 5.8 (H) 4.8 - 5.6 %  Microalbumin, Urine Waived   Collection Time: 09/04/23 11:13 AM  Result Value Ref Range   Microalb, Ur Waived 10 0 - 19 mg/L   Creatinine, Urine Waived 50 10 - 300 mg/dL   Microalb/Creat Ratio 30-300 (H) <30 mg/g  CBC with Differential/Platelet   Collection Time: 09/04/23 11:14 AM  Result Value Ref Range   WBC 8.4 3.4 - 10.8 x10E3/uL   RBC 4.75 3.77 - 5.28 x10E6/uL   Hemoglobin 14.6 11.1 - 15.9 g/dL   Hematocrit 57.0 65.9 - 46.6 %   MCV 90 79 - 97 fL   MCH 30.7 26.6 - 33.0 pg   MCHC 34.0 31.5 - 35.7 g/dL   RDW 87.2 88.2 - 84.5 %   Platelets 242 150 - 450 x10E3/uL   Neutrophils 64 Not Estab. %   Lymphs 25 Not Estab. %   Monocytes 8 Not Estab. %   Eos 2 Not Estab. %   Basos 1 Not Estab. %   Neutrophils Absolute 5.3 1.4 - 7.0 x10E3/uL   Lymphocytes Absolute 2.1 0.7 - 3.1 x10E3/uL   Monocytes Absolute 0.7 0.1 - 0.9  x10E3/uL   EOS (ABSOLUTE) 0.2 0.0 - 0.4 x10E3/uL   Basophils Absolute 0.1 0.0 - 0.2 x10E3/uL   Immature Granulocytes 0 Not Estab. %   Immature Grans (Abs) 0.0 0.0 - 0.1 x10E3/uL  Comprehensive metabolic panel   Collection Time: 09/04/23 11:14 AM  Result Value Ref Range   Glucose 91 70 - 99 mg/dL   BUN 13 8 - 27 mg/dL   Creatinine, Ser 9.31 0.57 - 1.00 mg/dL   eGFR 92 >40 fO/fpw/8.26   BUN/Creatinine Ratio 19 12 - 28   Sodium 134 134 - 144 mmol/L   Potassium 4.3 3.5 - 5.2 mmol/L   Chloride 97 96 - 106 mmol/L   CO2 24 20 - 29 mmol/L   Calcium  9.3 8.7 - 10.3 mg/dL   Total Protein 6.7 6.0 - 8.5 g/dL   Albumin 4.1 3.8 - 4.8 g/dL   Globulin, Total 2.6 1.5 - 4.5 g/dL   Bilirubin Total 0.4 0.0 - 1.2 mg/dL   Alkaline Phosphatase 84 44 - 121 IU/L   AST 21 0 - 40 IU/L   ALT 21 0 - 32 IU/L  Lipid Panel w/o Chol/HDL Ratio   Collection Time: 09/04/23 11:14 AM  Result Value Ref Range   Cholesterol, Total 138 100 - 199 mg/dL   Triglycerides 877 0 - 149 mg/dL   HDL 50 >60 mg/dL   VLDL Cholesterol Cal 22 5 - 40 mg/dL   LDL Chol Calc (NIH) 66 0 - 99 mg/dL  TSH   Collection Time: 09/04/23 11:14 AM  Result Value Ref Range   TSH 1.130 0.450 - 4.500 uIU/mL  Magnesium   Collection Time: 09/04/23 11:14 AM  Result Value Ref Range   Magnesium 1.8 1.6 - 2.3 mg/dL  T4, free   Collection Time: 09/04/23 11:14 AM  Result Value Ref Range   Free T4 1.61 0.82 - 1.77 ng/dL      Assessment & Plan:   Problem List Items Addressed This Visit   None Visit Diagnoses       Breast pain, left    -  Primary   On and off.  No discharge or discoloration. Will order diagnostic mammogram. Patient has a history of breast cancer.   Relevant Orders   US  LIMITED ULTRASOUND INCLUDING AXILLA LEFT BREAST    MM 3D DIAGNOSTIC MAMMOGRAM UNILATERAL LEFT BREAST        Follow up plan: Return if symptoms worsen or fail to improve.

## 2023-12-23 NOTE — Patient Instructions (Signed)
 Please call to schedule your mammogram and/or bone density: [facility]  Address: [address], [place], [place] [address] Phone: [phone]  [facility] [address] [place],  [place]  [address] Phone: [phone]

## 2023-12-26 ENCOUNTER — Ambulatory Visit
Admission: RE | Admit: 2023-12-26 | Discharge: 2023-12-26 | Disposition: A | Discharge status: Home / Self Care | Source: Ambulatory Visit | Attending: Nurse Practitioner | Admitting: Nurse Practitioner

## 2023-12-26 DIAGNOSIS — R92322 Mammographic fibroglandular density, left breast: Secondary | ICD-10-CM | POA: Diagnosis not present

## 2023-12-26 DIAGNOSIS — N6320 Unspecified lump in the left breast, unspecified quadrant: Secondary | ICD-10-CM | POA: Diagnosis not present

## 2023-12-26 DIAGNOSIS — N644 Mastodynia: Secondary | ICD-10-CM | POA: Diagnosis not present

## 2023-12-29 ENCOUNTER — Ambulatory Visit: Payer: Self-pay | Admitting: Nurse Practitioner

## 2024-02-22 ENCOUNTER — Other Ambulatory Visit: Payer: Self-pay | Admitting: Nurse Practitioner

## 2024-02-22 DIAGNOSIS — I10 Essential (primary) hypertension: Secondary | ICD-10-CM

## 2024-02-22 DIAGNOSIS — I2583 Coronary atherosclerosis due to lipid rich plaque: Secondary | ICD-10-CM

## 2024-02-22 DIAGNOSIS — E782 Mixed hyperlipidemia: Secondary | ICD-10-CM

## 2024-02-24 NOTE — Telephone Encounter (Signed)
 Requested Prescriptions  Pending Prescriptions Disp Refills   ezetimibe  (ZETIA ) 10 MG tablet [Pharmacy Med Name: Ezetimibe  10 MG Oral Tablet] 90 tablet 0    Sig: TAKE 1 TABLET BY MOUTH DAILY     Cardiovascular:  Antilipid - Sterol Transport Inhibitors Failed - 02/24/2024 11:43 AM      Failed - Lipid Panel in normal range within the last 12 months    Cholesterol, Total  Date Value Ref Range Status  09/04/2023 138 100 - 199 mg/dL Final   Cholesterol Piccolo, Waived  Date Value Ref Range Status  08/17/2018 133 <200 mg/dL Final    Comment:                            Desirable                <200                         Borderline High      200- 239                         High                     >239    LDL Chol Calc (NIH)  Date Value Ref Range Status  09/04/2023 66 0 - 99 mg/dL Final   HDL  Date Value Ref Range Status  09/04/2023 50 >39 mg/dL Final   Triglycerides  Date Value Ref Range Status  09/04/2023 122 0 - 149 mg/dL Final   Triglycerides Piccolo,Waived  Date Value Ref Range Status  08/17/2018 135 <150 mg/dL Final    Comment:                            Normal                   <150                         Borderline High     150 - 199                         High                200 - 499                         Very High                >499          Passed - AST in normal range and within 360 days    AST  Date Value Ref Range Status  09/04/2023 21 0 - 40 IU/L Final   AST (SGOT) Piccolo, Waived  Date Value Ref Range Status  08/17/2018 28 11 - 38 U/L Final         Passed - ALT in normal range and within 360 days    ALT  Date Value Ref Range Status  09/04/2023 21 0 - 32 IU/L Final   ALT (SGPT) Piccolo, Waived  Date Value Ref Range Status  08/17/2018 25 10 - 47 U/L Final         Passed - Patient is not pregnant  Passed - Valid encounter within last 12 months    Recent Outpatient Visits           2 months ago Breast pain, left   [place]  [facility] Melvin Pao, NP   5 months ago Morbid obesity (HCC)   [place] [facility] [place], [place] T, NP               levothyroxine  (SYNTHROID ) 125 MCG tablet [Pharmacy Med Name: Levothyroxine  Sodium 125 MCG Oral Tablet] 90 tablet 0    Sig: TAKE 1 TABLET BY MOUTH DAILY     Endocrinology:  Hypothyroid Agents Passed - 02/24/2024 11:43 AM      Passed - TSH in normal range and within 360 days    TSH  Date Value Ref Range Status  09/04/2023 1.130 0.450 - 4.500 uIU/mL Final         Passed - Valid encounter within last 12 months    Recent Outpatient Visits           2 months ago Breast pain, left   [facility] Melvin Pao, NP   5 months ago Morbid obesity (HCC)   [place] [facility] [place], [place] T, NP               hydrochlorothiazide  (HYDRODIURIL ) 25 MG tablet [Pharmacy Med Name: hydroCHLOROthiazide  25 MG Oral Tablet] 90 tablet 0    Sig: TAKE 1 TABLET BY MOUTH DAILY     Cardiovascular: Diuretics - Thiazide Passed - 02/24/2024 11:43 AM      Passed - Cr in normal range and within 180 days    Creatinine  Date Value Ref Range Status  04/21/2013 0.81 0.60 - 1.30 mg/dL Final   Creatinine, Ser  Date Value Ref Range Status  09/04/2023 0.68 0.57 - 1.00 mg/dL Final         Passed - K in normal range and within 180 days    Potassium  Date Value Ref Range Status  09/04/2023 4.3 3.5 - 5.2 mmol/L Final  04/21/2013 3.7 3.5 - 5.1 mmol/L Final         Passed - Na in normal range and within 180 days    Sodium  Date Value Ref Range Status  09/04/2023 134 134 - 144 mmol/L Final  04/21/2013 141 136 - 145 mmol/L Final         Passed - Last BP in normal range    BP Readings from Last 1 Encounters:  12/23/23 130/75         Passed - Valid encounter within last 6 months    Recent Outpatient Visits           2 months ago Breast pain, left   [place] [facility]  Melvin Pao, NP   5 months ago Morbid obesity [facility])   [place] [facility] Melanie DASEN, NP

## 2024-03-08 ENCOUNTER — Other Ambulatory Visit: Payer: Self-pay | Admitting: Nurse Practitioner

## 2024-03-08 DIAGNOSIS — Z1231 Encounter for screening mammogram for malignant neoplasm of breast: Secondary | ICD-10-CM

## 2024-03-13 NOTE — Patient Instructions (Signed)
 Be Involved in Caring For Your Health:  Taking Medications When medications are taken as directed, they can greatly improve your health. But if they are not taken as prescribed, they may not work. In some cases, not taking them correctly can be harmful. To help ensure your treatment remains effective and safe, understand your medications and how to take them. Bring your medications to each visit for review by your provider.  Your lab results, notes, and after visit summary will be available on My Chart. We strongly encourage you to use this feature. If lab results are abnormal the clinic will contact you with the appropriate steps. If the clinic does not contact you assume the results are satisfactory. You can always view your results on My Chart. If you have questions regarding your health or results, please contact the clinic during office hours. You can also ask questions on My Chart.  We at [facility] are grateful that you chose [place] to provide your care. We strive to provide evidence-based and compassionate care and are always looking for feedback. If you get a survey from the clinic please complete this so we can hear your opinions.  Heart-Healthy Eating Plan Many factors influence your heart health, including eating and exercise habits. Heart health is also called coronary health. Coronary risk increases with abnormal blood fat (lipid) levels. A heart-healthy eating plan includes limiting unhealthy fats, increasing healthy fats, limiting salt (sodium) intake, and making other diet and lifestyle changes. What is my plan? Your health care provider may recommend that: You limit your fat intake to _________% or less of your total calories each day. You limit your saturated fat intake to _________% or less of your total calories each day. You limit the amount of cholesterol in your diet to less than _________ mg per day. You limit the amount of sodium in your diet to less than _________  mg per day. What are tips for following this plan? Cooking Cook foods using methods other than frying. Baking, boiling, grilling, and broiling are all good options. Other ways to reduce fat include: Removing the skin from poultry. Removing all visible fats from meats. Steaming vegetables in water or broth. Meal planning  At meals, imagine dividing your plate into fourths: Fill one-half of your plate with vegetables and green salads. Fill one-fourth of your plate with whole grains. Fill one-fourth of your plate with lean protein foods. Eat 2-4 cups of vegetables per day. One cup of vegetables equals 1 cup (91 g) broccoli or cauliflower florets, 2 medium carrots, 1 large bell pepper, 1 large sweet potato, 1 large tomato, 1 medium white potato, 2 cups (150 g) raw leafy greens. Eat 1-2 cups of fruit per day. One cup of fruit equals 1 small apple, 1 large banana, 1 cup (237 g) mixed fruit, 1 large orange,  cup (82 g) dried fruit, 1 cup (240 mL) 100% fruit juice. Eat more foods that contain soluble fiber. Examples include apples, broccoli, carrots, beans, peas, and barley. Aim to get 25-30 g of fiber per day. Increase your consumption of legumes, nuts, and seeds to 4-5 servings per week. One serving of dried beans or legumes equals  cup (90 g) cooked, 1 serving of nuts is  oz (12 almonds, 24 pistachios, or 7 walnut halves), and 1 serving of seeds equals  oz (8 g). Fats Choose healthy fats more often. Choose monounsaturated and polyunsaturated fats, such as olive and canola oils, avocado oil, flaxseeds, walnuts, almonds, and seeds. Eat  more omega-3 fats. Choose salmon, mackerel, sardines, tuna, flaxseed oil, and ground flaxseeds. Aim to eat fish at least 2 times each week. Check food labels carefully to identify foods with trans fats or high amounts of saturated fat. Limit saturated fats. These are found in animal products, such as meats, butter, and cream. Plant sources of saturated fats  include palm oil, palm kernel oil, and coconut oil. Avoid foods with partially hydrogenated oils in them. These contain trans fats. Examples are stick margarine, some tub margarines, cookies, crackers, and other baked goods. Avoid fried foods. General information Eat more home-cooked food and less restaurant, buffet, and fast food. Limit or avoid alcohol. Limit foods that are high in added sugar and simple starches such as foods made using white refined flour (white breads, pastries, sweets). Lose weight if you are overweight. Losing just 5-10% of your body weight can help your overall health and prevent diseases such as diabetes and heart disease. Monitor your sodium intake, especially if you have high blood pressure. Talk with your health care provider about your sodium intake. Try to incorporate more vegetarian meals weekly. What foods should I eat? Fruits All fresh, canned (in natural juice), or frozen fruits. Vegetables Fresh or frozen vegetables (raw, steamed, roasted, or grilled). Green salads. Grains Most grains. Choose whole wheat and whole grains most of the time. Rice and pasta, including brown rice and pastas made with whole wheat. Meats and other proteins Lean, well-trimmed beef, veal, pork, and lamb. Chicken and [place] without skin. All fish and shellfish. Wild duck, rabbit, pheasant, and venison. Egg whites or low-cholesterol egg substitutes. Dried beans, peas, lentils, and tofu. Seeds and most nuts. Dairy Low-fat or nonfat cheeses, including ricotta and mozzarella. Skim or 1% milk (liquid, powdered, or evaporated). Buttermilk made with low-fat milk. Nonfat or low-fat yogurt. Fats and oils Non-hydrogenated (trans-free) margarines. Vegetable oils, including soybean, sesame, sunflower, olive, avocado, peanut, safflower, corn, canola, and cottonseed. Salad dressings or mayonnaise made with a vegetable oil. Beverages Water (mineral or sparkling). Coffee and tea. Unsweetened ice  tea. Diet beverages. Sweets and desserts Sherbet, gelatin, and fruit ice. Small amounts of dark chocolate. Limit all sweets and desserts. Seasonings and condiments All seasonings and condiments. The items listed above may not be a complete list of foods and beverages you can eat. Contact a dietitian for more options. What foods should I avoid? Fruits Canned fruit in heavy syrup. Fruit in cream or butter sauce. Fried fruit. Limit coconut. Vegetables Vegetables cooked in cheese, cream, or butter sauce. Fried vegetables. Grains Breads made with saturated or trans fats, oils, or whole milk. Croissants. Sweet rolls. Donuts. High-fat crackers, such as cheese crackers and chips. Meats and other proteins Fatty meats, such as hot dogs, ribs, sausage, bacon, rib-eye roast or steak. High-fat deli meats, such as salami and bologna. Caviar. Domestic duck and goose. Organ meats, such as liver. Dairy Cream, sour cream, cream cheese, and creamed cottage cheese. Whole-milk cheeses. Whole or 2% milk (liquid, evaporated, or condensed). Whole buttermilk. Cream sauce or high-fat cheese sauce. Whole-milk yogurt. Fats and oils Meat fat, or shortening. Cocoa butter, hydrogenated oils, palm oil, coconut oil, palm kernel oil. Solid fats and shortenings, including bacon fat, salt pork, lard, and butter. Nondairy cream substitutes. Salad dressings with cheese or sour cream. Beverages Regular sodas and any drinks with added sugar. Sweets and desserts Frosting. Pudding. Cookies. Cakes. Pies. Milk chocolate or white chocolate. Buttered syrups. Full-fat ice cream or ice cream drinks. The items listed above may  not be a complete list of foods and beverages to avoid. Contact a dietitian for more information. Summary Heart-healthy meal planning includes limiting unhealthy fats, increasing healthy fats, limiting salt (sodium) intake and making other diet and lifestyle changes. Lose weight if you are overweight. Losing just  5-10% of your body weight can help your overall health and prevent diseases such as diabetes and heart disease. Focus on eating a balance of foods, including fruits and vegetables, low-fat or nonfat dairy, lean protein, nuts and legumes, whole grains, and heart-healthy oils and fats. This information is not intended to replace advice given to you by your health care provider. Make sure you discuss any questions you have with your health care provider. Document Revised: 07/02/2021 Document Reviewed: 07/02/2021 Elsevier Patient Education  2024 [employer].

## 2024-03-16 ENCOUNTER — Encounter: Payer: Self-pay | Admitting: Nurse Practitioner

## 2024-03-16 ENCOUNTER — Ambulatory Visit
Admission: RE | Admit: 2024-03-16 | Discharge: 2024-03-16 | Disposition: A | Discharge status: Home / Self Care | Source: Ambulatory Visit | Attending: Nurse Practitioner | Admitting: Nurse Practitioner

## 2024-03-16 ENCOUNTER — Ambulatory Visit (INDEPENDENT_AMBULATORY_CARE_PROVIDER_SITE_OTHER): Admitting: Nurse Practitioner

## 2024-03-16 DIAGNOSIS — M47816 Spondylosis without myelopathy or radiculopathy, lumbar region: Secondary | ICD-10-CM | POA: Diagnosis not present

## 2024-03-16 DIAGNOSIS — I10 Essential (primary) hypertension: Secondary | ICD-10-CM | POA: Diagnosis not present

## 2024-03-16 DIAGNOSIS — R7303 Prediabetes: Secondary | ICD-10-CM | POA: Diagnosis not present

## 2024-03-16 DIAGNOSIS — I2583 Coronary atherosclerosis due to lipid rich plaque: Secondary | ICD-10-CM | POA: Diagnosis not present

## 2024-03-16 DIAGNOSIS — K219 Gastro-esophageal reflux disease without esophagitis: Secondary | ICD-10-CM

## 2024-03-16 DIAGNOSIS — M48061 Spinal stenosis, lumbar region without neurogenic claudication: Secondary | ICD-10-CM | POA: Diagnosis not present

## 2024-03-16 DIAGNOSIS — Z23 Encounter for immunization: Secondary | ICD-10-CM | POA: Diagnosis not present

## 2024-03-16 DIAGNOSIS — I77811 Abdominal aortic ectasia: Secondary | ICD-10-CM | POA: Diagnosis not present

## 2024-03-16 DIAGNOSIS — E039 Hypothyroidism, unspecified: Secondary | ICD-10-CM

## 2024-03-16 DIAGNOSIS — E782 Mixed hyperlipidemia: Secondary | ICD-10-CM

## 2024-03-16 DIAGNOSIS — M545 Low back pain, unspecified: Secondary | ICD-10-CM | POA: Insufficient documentation

## 2024-03-16 DIAGNOSIS — M4316 Spondylolisthesis, lumbar region: Secondary | ICD-10-CM | POA: Diagnosis not present

## 2024-03-16 DIAGNOSIS — M438X6 Other specified deforming dorsopathies, lumbar region: Secondary | ICD-10-CM | POA: Diagnosis not present

## 2024-03-16 DIAGNOSIS — R1310 Dysphagia, unspecified: Secondary | ICD-10-CM

## 2024-03-16 LAB — BAYER DCA HB A1C WAIVED: HB A1C (BAYER DCA - WAIVED): 5.9 % — ABNORMAL HIGH (ref 4.8–5.6)

## 2024-03-16 MED ORDER — EZETIMIBE 10 MG PO TABS: 10.0000 mg | ORAL_TABLET | Freq: Every day | ORAL | 4 refills | Status: AC

## 2024-03-16 MED ORDER — MONTELUKAST SODIUM 10 MG PO TABS: 10.0000 mg | ORAL_TABLET | Freq: Every day | ORAL | 4 refills | Status: AC

## 2024-03-16 MED ORDER — HYDROCHLOROTHIAZIDE 25 MG PO TABS: 25.0000 mg | ORAL_TABLET | Freq: Every day | ORAL | 4 refills | Status: AC

## 2024-03-16 MED ORDER — LEVOTHYROXINE SODIUM 125 MCG PO TABS: 125.0000 ug | ORAL_TABLET | Freq: Every day | ORAL | 4 refills | Status: AC

## 2024-03-16 NOTE — Progress Notes (Signed)
 BP 113/72   Pulse 60   Temp 98.8 F (37.1 C) (Oral)   Ht 5' 1 (1.549 m)   Wt 226 lb 2 oz (102.6 kg)   SpO2 97%   BMI 42.73 kg/m    Subjective:    Patient ID: Annette Porter, female    DOB: Jun 07, 1951, 73 y.o.   MRN: [id number]  HPI: ALEECIA TAPIA is a 73 y.o. female  Chief Complaint  Patient presents with   Hypertension   Gastroesophageal Reflux   Feels like the dysphagia she reported last visit is getting worse, things are getting stuck more often. Calcium  pills she is having to cut in 4's and even some foods and water feel like they are getting stuck.  Has had to have esophagus stretched in past. Continues Pepcid daily.  Hips are off alignment again, did PT in past or this. Has lumbar scoliosis. Having discomfort again. Has had occasional numbness to right lower leg.  HYPERTENSION / HYPERLIPIDEMIA Taking Metoprolol , Imdur, HCTZ, Zetia , and Lipitor. Has used one time in July. Imaging in 2016 noted aortic ectasia, but on recheck 03/14/23 no aneurysm noted. History of MI February 2000. Last saw cardiology on 11/18/23. [place] a [place] spells at beach months back where she felt a little off when she came in, shaky/dizzy like. Has had about 3 of those episodes. Is a hot natured person at baseline. Satisfied with current treatment? yes Duration of hypertension: chronic BP monitoring frequency: a few times a month BP range: <130/80 on average BP medication side effects: no Duration of hyperlipidemia: chronic Cholesterol medication side effects: no Cholesterol supplements: none Medication compliance: good compliance Aspirin: yes Recent stressors: no Recurrent headaches: no Visual changes: no Palpitations: no Dyspnea: no Chest pain: no Lower extremity edema: no Dizzy/lightheaded: as above The ASCVD Risk score (Arnett DK, et al., 2019) failed to calculate for the following reasons:   Risk score cannot be calculated because patient has a medical history suggesting prior/existing  ASCVD  HYPOTHYROIDISM Takes Levothyroxine  125 MCG daily. Thyroid  control status:stable Satisfied with current treatment? yes Medication side effects: no Medication compliance: good compliance Etiology of hypothyroidism: unknown Recent dose adjustment:no Fatigue: no Cold intolerance: no Heat intolerance: no Weight gain: no Weight loss: no Constipation: no Diarrhea/loose stools: no Palpitations: no Lower extremity edema: no Anxiety/depressed mood: no   Impaired Fasting Glucose HbA1C:  Lab Results  Component Value Date   HGBA1C 5.8 (H) 09/04/2023  Polydipsia: no Polyuria: no Weight change: no Visual disturbance: no Glucose Monitoring: no    Accucheck frequency: Not Checking    Fasting glucose:     Post prandial:  Diabetic Education: Not Completed Family history of diabetes: yes -- mother and maternal brother     03/16/2024    8:47 AM 03/16/2024    8:30 AM 12/23/2023    9:58 AM 09/04/2023   11:09 AM 04/17/2023    9:05 AM  Depression screen PHQ 2/9  Decreased Interest 0 0 0 0 0  Down, Depressed, Hopeless 0 0 0 0 0  PHQ - 2 Score 0 0 0 0 0  Altered sleeping 0 0 0 0 0  Tired, decreased energy 0 0 0 0 0  Change in appetite 0 0 0 0 0  Feeling bad or failure about yourself  0 0 0 0 0  Trouble concentrating 0 0 0 0 0  Moving slowly or fidgety/restless 0 0 0 0 0  Suicidal thoughts 0 0 0 0 0  PHQ-9 Score 0 0  0 0 0  Difficult doing work/chores Not difficult at all  Not difficult at all Not difficult at all Not difficult at all       03/16/2024    8:47 AM 03/16/2024    8:31 AM 12/23/2023    9:58 AM 09/04/2023   11:09 AM  GAD 7 : Generalized Anxiety Score  Nervous, Anxious, on Edge 0 0 0 0  Control/stop worrying 0 0 0 0  Worry too much - different things 0 0 0 0  Trouble relaxing 0 0 0 0  Restless 0 0 0 0  Easily annoyed or irritable 0 0 0 0  Afraid - awful might happen 0 0 0 0  Total GAD 7 Score 0 0 0 0  Anxiety Difficulty Not difficult at all  Not difficult at all  Not difficult at all      Relevant past medical, surgical, family and social history reviewed and updated as indicated. Interim medical history since our last visit reviewed. Allergies and medications reviewed and updated.  Review of Systems  Constitutional:  Negative for activity change, appetite change, diaphoresis, fatigue and fever.  Respiratory:  Negative for cough, chest tightness and shortness of breath.   Cardiovascular:  Negative for chest pain, palpitations and leg swelling.  Gastrointestinal: Negative.   Endocrine: Negative for cold intolerance, heat intolerance, polydipsia, polyphagia and polyuria.  Neurological: Negative.   Psychiatric/Behavioral: Negative.      Per HPI unless specifically indicated above     Objective:    BP 113/72   Pulse 60   Temp 98.8 F (37.1 C) (Oral)   Ht 5' 1 (1.549 m)   Wt 226 lb 2 oz (102.6 kg)   SpO2 97%   BMI 42.73 kg/m   Wt Readings from Last 3 Encounters:  03/16/24 226 lb 2 oz (102.6 kg)  12/23/23 227 lb (103 kg)  09/04/23 227 lb 9.6 oz (103.2 kg)    Physical Exam Vitals and nursing note reviewed.  Constitutional:      General: She is awake. She is not in acute distress.    Appearance: She is well-developed and well-groomed. She is obese. She is not ill-appearing or toxic-appearing.  HENT:     Head: Normocephalic.     Right Ear: Hearing and external ear normal.     Left Ear: Hearing and external ear normal.  Eyes:     General: Lids are normal.        Right eye: No discharge.        Left eye: No discharge.     Conjunctiva/sclera: Conjunctivae normal.     Pupils: Pupils are equal, round, and reactive to light.  Neck:     Thyroid : No thyromegaly.     Vascular: No carotid bruit.  Cardiovascular:     Rate and Rhythm: Normal rate and regular rhythm.     Heart sounds: Normal heart sounds. No murmur heard.    No gallop.  Pulmonary:     Effort: Pulmonary effort is normal. No accessory muscle usage or respiratory distress.      Breath sounds: Normal breath sounds.  Abdominal:     General: Bowel sounds are normal. There is no distension.     Palpations: Abdomen is soft.     Tenderness: There is no abdominal tenderness.  Musculoskeletal:     Cervical back: Normal range of motion and neck supple.     Right lower leg: No edema.     Left lower leg: No edema.  Lymphadenopathy:  Cervical: No cervical adenopathy.  Skin:    General: Skin is warm and dry.  Neurological:     Mental Status: She is alert and oriented to person, place, and time.     Cranial Nerves: Cranial nerves 2-12 are intact.     Motor: Motor function is intact.     Coordination: Coordination is intact.     Gait: Gait is intact.     Deep Tendon Reflexes:     Reflex Scores:      Brachioradialis reflexes are 2+ on the right side and 2+ on the left side.      Patellar reflexes are 2+ on the right side and 2+ on the left side. Psychiatric:        Attention and Perception: Attention normal.        Mood and Affect: Mood normal.        Behavior: Behavior normal. Behavior is cooperative.        Thought Content: Thought content normal.        Judgment: Judgment normal.    Results for orders placed or performed in visit on 09/04/23  Bayer DCA Hb A1c Waived   Collection Time: 09/04/23 11:13 AM  Result Value Ref Range   HB A1C (BAYER DCA - WAIVED) 5.8 (H) 4.8 - 5.6 %  Microalbumin, Urine Waived   Collection Time: 09/04/23 11:13 AM  Result Value Ref Range   Microalb, Ur Waived 10 0 - 19 mg/L   Creatinine, Urine Waived 50 10 - 300 mg/dL   Microalb/Creat Ratio 30-300 (H) <30 mg/g  CBC with Differential/Platelet   Collection Time: 09/04/23 11:14 AM  Result Value Ref Range   WBC 8.4 3.4 - 10.8 x10E3/uL   RBC 4.75 3.77 - 5.28 x10E6/uL   Hemoglobin 14.6 11.1 - 15.9 g/dL   Hematocrit 57.0 65.9 - 46.6 %   MCV 90 79 - 97 fL   MCH 30.7 26.6 - 33.0 pg   MCHC 34.0 31.5 - 35.7 g/dL   RDW 87.2 88.2 - 84.5 %   Platelets 242 150 - 450 x10E3/uL    Neutrophils 64 Not Estab. %   Lymphs 25 Not Estab. %   Monocytes 8 Not Estab. %   Eos 2 Not Estab. %   Basos 1 Not Estab. %   Neutrophils Absolute 5.3 1.4 - 7.0 x10E3/uL   Lymphocytes Absolute 2.1 0.7 - 3.1 x10E3/uL   Monocytes Absolute 0.7 0.1 - 0.9 x10E3/uL   EOS (ABSOLUTE) 0.2 0.0 - 0.4 x10E3/uL   Basophils Absolute 0.1 0.0 - 0.2 x10E3/uL   Immature Granulocytes 0 Not Estab. %   Immature Grans (Abs) 0.0 0.0 - 0.1 x10E3/uL  Comprehensive metabolic panel   Collection Time: 09/04/23 11:14 AM  Result Value Ref Range   Glucose 91 70 - 99 mg/dL   BUN 13 8 - 27 mg/dL   Creatinine, Ser 9.31 0.57 - 1.00 mg/dL   eGFR 92 >40 fO/fpw/8.26   BUN/Creatinine Ratio 19 12 - 28   Sodium 134 134 - 144 mmol/L   Potassium 4.3 3.5 - 5.2 mmol/L   Chloride 97 96 - 106 mmol/L   CO2 24 20 - 29 mmol/L   Calcium  9.3 8.7 - 10.3 mg/dL   Total Protein 6.7 6.0 - 8.5 g/dL   Albumin 4.1 3.8 - 4.8 g/dL   Globulin, Total 2.6 1.5 - 4.5 g/dL   Bilirubin Total 0.4 0.0 - 1.2 mg/dL   Alkaline Phosphatase 84 44 - 121 IU/L   AST 21 0 -  40 IU/L   ALT 21 0 - 32 IU/L  Lipid Panel w/o Chol/HDL Ratio   Collection Time: 09/04/23 11:14 AM  Result Value Ref Range   Cholesterol, Total 138 100 - 199 mg/dL   Triglycerides 877 0 - 149 mg/dL   HDL 50 >60 mg/dL   VLDL Cholesterol Cal 22 5 - 40 mg/dL   LDL Chol Calc (NIH) 66 0 - 99 mg/dL  TSH   Collection Time: 09/04/23 11:14 AM  Result Value Ref Range   TSH 1.130 0.450 - 4.500 uIU/mL  Magnesium   Collection Time: 09/04/23 11:14 AM  Result Value Ref Range   Magnesium 1.8 1.6 - 2.3 mg/dL  T4, free   Collection Time: 09/04/23 11:14 AM  Result Value Ref Range   Free T4 1.61 0.82 - 1.77 ng/dL      Assessment & Plan:   Problem List Items Addressed This Visit       Cardiovascular and Mediastinum   Hypertension   Chronic, ongoing. BP at goal in office and at home.  Recommend she monitor BP at least a few mornings a week at home and document.  DASH diet at home.   Continue current medication regimen and adjust as needed.  Labs: BMP.  Continue to collaborate with cardiology, recent notes reviewed.  Recommend if she notices consistent BP above 130/80 at home to alert provider.        Relevant Medications   ezetimibe  (ZETIA ) 10 MG tablet   hydrochlorothiazide  (HYDRODIURIL ) 25 MG tablet   Other Relevant Orders   Basic metabolic panel with GFR   CAD (coronary atherosclerotic disease)   Chronic, continue ASA and statin.  Continue collaboration with cardiology, recent notes reviewed.      Relevant Medications   ezetimibe  (ZETIA ) 10 MG tablet   hydrochlorothiazide  (HYDRODIURIL ) 25 MG tablet   Other Relevant Orders   Lipid Panel w/o Chol/HDL Ratio   Abdominal aortic ectasia   Noted on imaging 2016, but imaging on 03/14/23 showed no aneurysm.      Relevant Medications   ezetimibe  (ZETIA ) 10 MG tablet   hydrochlorothiazide  (HYDRODIURIL ) 25 MG tablet   Other Relevant Orders   Lipid Panel w/o Chol/HDL Ratio     Digestive   GERD (gastroesophageal reflux disease)   Chronic, ongoing.  Continue Pepcid as needed.  Mag level annually.  Referral to Gi due to increase dysphagia issues over past year.        Endocrine   Hypothyroidism   Chronic, ongoing.  Continue current medication regimen and adjust as needed based on thyroid  labs.  Recheck labs at physical.      Relevant Medications   levothyroxine  (SYNTHROID ) 125 MCG tablet   montelukast  (SINGULAIR ) 10 MG tablet     Other   Prediabetes   A1c trend down to 5.8% last check, recheck today.  Discussed with her -- she is working on diet and exercise.  Start medication as needed.  Educated her on red flag symptoms to report.      Relevant Orders   Bayer DCA Hb A1c Waived   Morbid obesity (HCC) - Primary   BMI 42.73with HTN, HLD, CAD.  Recommended eating smaller high protein, low fat meals more frequently and exercising 30 mins a day 5 times a week with a goal of 10-15lb weight loss in the next 3  months. Patient voiced their understanding and motivation to adhere to these recommendations.       Lumbar spine pain   Chronic with some worsening  presenting.  Will recheck imaging and determine next steps after report returns.  Would benefit return to PT.      Relevant Orders   DG Lumbar Spine Complete   Hyperlipidemia   Chronic, ongoing.  Continue current medication regimen and adjust as needed.  Lipid panel today.      Relevant Medications   ezetimibe  (ZETIA ) 10 MG tablet   hydrochlorothiazide  (HYDRODIURIL ) 25 MG tablet   Other Relevant Orders   Lipid Panel w/o Chol/HDL Ratio   Other Visit Diagnoses       Flu vaccine need       Flu vaccine today, educated patient.   Relevant Orders   Flu vaccine HIGH DOSE PF(Fluzone Trivalent) (Completed)        Follow up plan: Return in about 6 months (around 09/14/2024) for Annual Physical after 09/03/24.

## 2024-03-16 NOTE — Assessment & Plan Note (Signed)
 Noted on imaging 2016, but imaging on 03/14/23 showed no aneurysm.

## 2024-03-16 NOTE — Assessment & Plan Note (Signed)
 Chronic, ongoing. BP at goal in office and at home.  Recommend she monitor BP at least a few mornings a week at home and document.  DASH diet at home.  Continue current medication regimen and adjust as needed.  Labs: BMP.  Continue to collaborate with cardiology, recent notes reviewed.  Recommend if she notices consistent BP above 130/80 at home to alert provider.

## 2024-03-16 NOTE — Assessment & Plan Note (Signed)
 BMI 42.73with HTN, HLD, CAD.  Recommended eating smaller high protein, low fat meals more frequently and exercising 30 mins a day 5 times a week with a goal of 10-15lb weight loss in the next 3 months. Patient voiced their understanding and motivation to adhere to these recommendations.

## 2024-03-16 NOTE — Assessment & Plan Note (Signed)
 Chronic, ongoing.  Continue Pepcid as needed.  Mag level annually.  Referral to Gi due to increase dysphagia issues over past year.

## 2024-03-16 NOTE — Assessment & Plan Note (Signed)
 A1c trend down to 5.8% last check, recheck today.  Discussed with her -- she is working on diet and exercise.  Start medication as needed.  Educated her on red flag symptoms to report.

## 2024-03-16 NOTE — Assessment & Plan Note (Signed)
 Chronic, ongoing.  Continue current medication regimen and adjust as needed. Lipid panel today.

## 2024-03-16 NOTE — Assessment & Plan Note (Signed)
Chronic, continue ASA and statin.  Continue collaboration with cardiology, recent notes reviewed.

## 2024-03-16 NOTE — Addendum Note (Signed)
 Addended by: Tihanna Goodson T on: 03/16/2024 09:34 AM   Modules accepted: Orders

## 2024-03-16 NOTE — Assessment & Plan Note (Signed)
 Chronic with some worsening presenting.  Will recheck imaging and determine next steps after report returns.  Would benefit return to PT.

## 2024-03-16 NOTE — Assessment & Plan Note (Signed)
 Chronic, ongoing.  Continue current medication regimen and adjust as needed based on thyroid  labs.  Recheck labs at physical.

## 2024-03-17 ENCOUNTER — Ambulatory Visit: Payer: Self-pay | Admitting: Nurse Practitioner

## 2024-03-17 LAB — BASIC METABOLIC PANEL WITH GFR
BUN/Creatinine Ratio: 18 (ref 12–28)
BUN: 13 mg/dL (ref 8–27)
CO2: 23 mmol/L (ref 20–29)
Calcium: 9.6 mg/dL (ref 8.7–10.3)
Chloride: 97 mmol/L (ref 96–106)
Creatinine, Ser: 0.72 mg/dL (ref 0.57–1.00)
Glucose: 92 mg/dL (ref 70–99)
Potassium: 4.4 mmol/L (ref 3.5–5.2)
Sodium: 134 mmol/L (ref 134–144)
eGFR: 88 mL/min/1.73 (ref >59)

## 2024-03-17 LAB — LIPID PANEL W/O CHOL/HDL RATIO
Cholesterol, Total: 134 mg/dL (ref 100–199)
HDL: 45 mg/dL (ref >39)
LDL Chol Calc (NIH): 68 mg/dL (ref 0–99)
Triglycerides: 117 mg/dL (ref 0–149)
VLDL Cholesterol Cal: 21 mg/dL (ref 5–40)

## 2024-03-17 NOTE — Progress Notes (Signed)
 Contacted via MyChart  Good afternoon Annette Porter, your labs have returned and remain stable.  No changes needed.  Any questions? Keep being stellar!!  Thank you for allowing me to participate in your care.  I appreciate you. Kindest regards, Lazariah Savard

## 2024-03-30 ENCOUNTER — Telehealth: Payer: Self-pay

## 2024-03-30 DIAGNOSIS — M545 Low back pain, unspecified: Secondary | ICD-10-CM

## 2024-03-30 NOTE — Telephone Encounter (Unsigned)
 Copied from CRM #[id number]. Topic: Clinical - Lab/Test Results >> Mar 30, 2024 10:23 AM Myrick T wrote: Reason for CRM: patient called with questions about her images and PT that provider suggested she have. Please f/u with paitent

## 2024-04-01 NOTE — Telephone Encounter (Signed)
 Patient would like to proceed with PT at the [facility] location.  Address [address] [place], [place] [address]  Contact Phone: [phone]

## 2024-04-12 ENCOUNTER — Telehealth: Payer: Self-pay | Admitting: Nurse Practitioner

## 2024-04-12 NOTE — Telephone Encounter (Unsigned)
 Copied from CRM [id number]. Topic: Referral - Question >> Apr 12, 2024 11:35 AM Annette Porter wrote: Reason for CRM: Annette Porter called in about her referral that was placed on 04/01/24, Annette Porter was called to be scheduled.    Annette Porter would like to know if can be referred at the beginning of December.   Annette Porter will be out of town [facility] 4-8th and can be sent within 1-8 of December so she can attend Annette Porter   Because pts schedule is too full for the month of November and will have issues trying to go to Annette Porter.   pls follow up with Annette Porter if any questions.

## 2024-04-13 NOTE — Telephone Encounter (Signed)
 Confirming it is ok for her to schedule as her schedule allows?

## 2024-04-13 NOTE — Telephone Encounter (Signed)
 Discussed with patient. She will call [facility] back and attempt to schedule based on current referral and when she will be available. It sounds like they may urge her to request a new referral so she may call back and ask for that.

## 2024-04-21 ENCOUNTER — Ambulatory Visit
Admission: RE | Admit: 2024-04-21 | Discharge: 2024-04-21 | Disposition: A | Discharge status: Home / Self Care | Source: Ambulatory Visit | Attending: Nurse Practitioner | Admitting: Nurse Practitioner

## 2024-04-21 DIAGNOSIS — Z1231 Encounter for screening mammogram for malignant neoplasm of breast: Secondary | ICD-10-CM | POA: Diagnosis not present

## 2024-04-23 ENCOUNTER — Ambulatory Visit: Payer: Self-pay | Admitting: Nurse Practitioner

## 2024-04-23 NOTE — Progress Notes (Signed)
 Contacted via MyChart   Normal mammogram, may repeat in one year:)

## 2024-04-29 ENCOUNTER — Ambulatory Visit: Payer: Self-pay | Admitting: Emergency Medicine

## 2024-04-29 VITALS — BP 120/62 | Ht 61.0 in | Wt 224.8 lb

## 2024-04-29 DIAGNOSIS — Z0000 Encounter for general adult medical examination without abnormal findings: Secondary | ICD-10-CM | POA: Diagnosis not present

## 2024-04-29 NOTE — Patient Instructions (Signed)
 Annette Porter,  Thank you for taking the time for your Medicare Wellness Visit. I appreciate your continued commitment to your health goals. Please review the care plan we discussed, and feel free to reach out if I can assist you further.  Please note that Annual Wellness Visits do not include a physical exam. Some assessments may be limited, especially if the visit was conducted virtually. If needed, we may recommend an in-person follow-up with your provider.  Ongoing Care Seeing your primary care provider every 3 to 6 months helps us  monitor your health and provide consistent, personalized care.   Referrals If a referral was made during today's visit and you haven't received any updates within two weeks, please contact the referred provider directly to check on the status.  Recommended Screenings:  You will be due for a colonoscopy around 05/30/25.  Keep up the good work!!  Health Maintenance  Topic Date Due   COVID-19 Vaccine (5 - 2025-26 season) 02/09/2024   Medicare Annual Wellness Visit  04/16/2024   DTaP/Tdap/Td vaccine (3 - Tdap) 12/20/2024   Breast Cancer Screening  04/21/2025   Colon Cancer Screening  05/30/2025   Osteoporosis screening with Bone Density Scan  08/28/2025   Flu Shot  Completed   Hepatitis C Screening  Completed   Zoster (Shingles) Vaccine  Completed   Meningitis B Vaccine  Aged Out   Pneumococcal Vaccine for age over 69  Discontinued       04/29/2024    8:53 AM  Advanced Directives  Does Patient Have a Medical Advance Directive? No  Would patient like information on creating a medical advance directive? No - Patient declined    Vision: Annual vision screenings are recommended for early detection of glaucoma, cataracts, and diabetic retinopathy. These exams can also reveal signs of chronic conditions such as diabetes and high blood pressure.  Dental: Annual dental screenings help detect early signs of oral cancer, gum disease, and other conditions linked  to overall health, including heart disease and diabetes.  Please see the attached documents for additional preventive care recommendations.   Fall Prevention in the Home, Adult Falls can cause injuries and affect people of all ages. There are many simple things that you can do to make your home safe and to help prevent falls. If you need it, ask for help making these changes. What actions can I take to prevent falls? General information Use good lighting in all rooms. Make sure to: Replace any light bulbs that burn out. Turn on lights if it is dark and use night-lights. Keep items that you use often in easy-to-reach places. Lower the shelves around your home if needed. Move furniture so that there are clear paths around it. Do not keep throw rugs or other things on the floor that can make you trip. If any of your floors are uneven, fix them. Add color or contrast paint or tape to clearly mark and help you see: Grab bars or handrails. First and last steps of staircases. Where the edge of each step is. If you use a ladder or stepladder: Make sure that it is fully opened. Do not climb a closed ladder. Make sure the sides of the ladder are locked in place. Have someone hold the ladder while you use it. Know where your pets are as you move through your home. What can I do in the bathroom?     Keep the floor dry. Clean up any water that is on the floor right away. Remove  soap buildup in the bathtub or shower. Buildup makes bathtubs and showers slippery. Use non-skid mats or decals on the floor of the bathtub or shower. Attach bath mats securely with double-sided, non-slip rug tape. If you need to sit down while you are in the shower, use a non-slip stool. Install grab bars by the toilet and in the bathtub and shower. Do not use towel bars as grab bars. What can I do in the bedroom? Make sure that you have a light by your bed that is easy to reach. Do not use any sheets or blankets on  your bed that hang to the floor. Have a firm bench or chair with side arms that you can use for support when you get dressed. What can I do in the kitchen? Clean up any spills right away. If you need to reach something above you, use a sturdy step stool that has a grab bar. Keep electrical cables out of the way. Do not use floor polish or wax that makes floors slippery. What can I do with my stairs? Do not leave anything on the stairs. Make sure that you have a light switch at the top and the bottom of the stairs. Have them installed if you do not have them. Make sure that there are handrails on both sides of the stairs. Fix handrails that are broken or loose. Make sure that handrails are as long as the staircases. Install non-slip stair treads on all stairs in your home if they do not have carpet. Avoid having throw rugs at the top or bottom of stairs, or secure the rugs with carpet tape to prevent them from moving. Choose a carpet design that does not hide the edge of steps on the stairs. Make sure that carpet is firmly attached to the stairs. Fix any carpet that is loose or worn. What can I do on the outside of my home? Use bright outdoor lighting. Repair the edges of walkways and driveways and fix any cracks. Clear paths of anything that can make you trip, such as tools or rocks. Add color or contrast paint or tape to clearly mark and help you see high doorway thresholds. Trim any bushes or trees on the main path into your home. Check that handrails are securely fastened and in good repair. Both sides of all steps should have handrails. Install guardrails along the edges of any raised decks or porches. Have leaves, snow, and ice cleared regularly. Use sand, salt, or ice melt on walkways during winter months if you live where there is ice and snow. In the garage, clean up any spills right away, including grease or oil spills. What other actions can I take? Review your medicines with your  health care provider. Some medicines can make you confused or feel dizzy. This can increase your chance of falling. Wear closed-toe shoes that fit well and support your feet. Wear shoes that have rubber soles and low heels. Use a cane, walker, scooter, or crutches that help you move around if needed. Talk with your provider about other ways that you can decrease your risk of falls. This may include seeing a physical therapist to learn to do exercises to improve movement and strength. Where to find more information Centers for Disease Control and Prevention, STEAD[employer]: tonerpromos.no [employer] on Aging: baseringtones.pl National Institute on Aging: baseringtones.pl Contact a health care provider if: You are afraid of falling at home. You feel weak, drowsy, or dizzy at home. You fall at home.  Get help right away if you: Lose consciousness or have trouble moving after a fall. Have a fall that causes a head injury. These symptoms may be an emergency. Get help right away. Call 911. Do not wait to see if the symptoms will go away. Do not drive yourself to the hospital. This information is not intended to replace advice given to you by your health care provider. Make sure you discuss any questions you have with your health care provider. Document Revised: 01/28/2022 Document Reviewed: 01/28/2022 Elsevier Patient Education  2024 [employer].

## 2024-04-29 NOTE — Progress Notes (Signed)
 Chief Complaint  Patient presents with   Medicare Wellness     Subjective:   Annette Porter is a 73 y.o. female who presents for a Medicare Annual Wellness Visit.  Allergies (verified) Benazepril , Dilaudid [hydromorphone], Hydrocodone , and Tape   History: Past Medical History:  Diagnosis Date   Allergy 2 yrs ago?   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   BREAST LUMPECTOMY Left 12/16/2013   IMC 11 mm ER/ PR positive, Her 2 neu negative, T1c, N0. Mammosite radiation.    BREAST LUMPECTOMY WITH MAMMOSITE CAVITY EVALUATION DEVICE Left 2014   BREAST LUMPECTOMY WITH MAMMOSITE CAVITY EVALUATION DEVICE Left 2015   BREAST MAMMOSITE  05/20/13   BREAST SURGERY Left 04/29/13   wide excision   BREAST SURGERY Left 12-16-13   Wide excision with SN biopsy, INVASIVE MAMMARY CARCINOMA   CARDIAC CATHETERIZATION     COLONOSCOPY  2006   COLONOSCOPY WITH PROPOFOL  N/A 05/31/2015   Procedure: COLONOSCOPY WITH PROPOFOL ;  Surgeon: Reyes LELON Cota, MD;  Location: [facility];  Service: Endoscopy;  Laterality: N/A;   HAND SURGERY     KNEE ARTHROSCOPY WITH MEDIAL MENISECTOMY Left 02/24/2015   Procedure: KNEE ARTHROSCOPY WITH PARTIAL MEDIAL MENISECTOMY;  Surgeon: Lonni Sharps, MD;  Location: [facility];  Service: Orthopedics;   Laterality: Left;   Family History  Problem Relation Age of Onset   Kidney disease Mother    Diabetes Mother    Heart disease Mother    Brain cancer Father    Cancer Father    Thyroid  disease Sister    Thyroid  disease Sister    Thyroid  disease Sister    Diabetes Maternal Uncle    Thyroid  disease Paternal Aunt    Breast cancer Neg Hx    Social History   Occupational History   Occupation: retired  Tobacco Use   Smoking status: Never   Smokeless tobacco: Never  Vaping Use   Vaping status: Never Used  Substance and Sexual Activity   Alcohol use: No   Drug use: No   Sexual activity: Not Currently   Tobacco Counseling Counseling given: Not Answered  SDOH Screenings   Food Insecurity: No Food Insecurity (04/29/2024)  Housing: Low Risk  (04/29/2024)  Transportation Needs: No Transportation Needs (04/29/2024)  Utilities: Not At Risk (04/29/2024)  Alcohol Screen: Low Risk  (12/23/2023)  Depression (PHQ2-9): Low Risk  (04/29/2024)  Financial Resource Strain: Low Risk  (04/29/2024)  Physical Activity: Inactive (04/29/2024)  Social Connections: Socially Integrated (04/29/2024)  Stress: No Stress Concern Present (04/29/2024)  Tobacco Use: Low Risk  (04/29/2024)  Health Literacy: Adequate Health Literacy (04/29/2024)   See flowsheets for full screening details  Depression Screen PHQ 2 & 9 Depression Scale- Over the past 2  weeks, how often have you been bothered by any of the following problems? Little interest or pleasure in doing things: 0 Feeling down, depressed, or hopeless (PHQ Adolescent also includes...irritable): 0 PHQ-2 Total Score: 0 Trouble falling or staying asleep, or sleeping too much: 0 Feeling tired or having little energy: 0 Poor appetite or overeating (PHQ Adolescent also includes...weight loss): 0 Feeling bad about yourself - or that you are a failure or have let yourself or your family down: 0 Trouble concentrating on things, such as reading the newspaper  or watching television (PHQ Adolescent also includes...like school work): 0 Moving or speaking so slowly that other people could have noticed. Or the opposite - being so fidgety or restless that you have been moving around a lot more than usual: 0 Thoughts that you would be better off dead, or of hurting yourself in some way: 0 PHQ-9 Total Score: 0 If you checked off any problems, how difficult have these problems made it for you to do your work, take care of things at home, or get along with other people?: Not difficult at all  Depression Treatment Depression Interventions/Treatment : EYV7-0 Score <4 Follow-up Not Indicated     Goals Addressed               This Visit's Progress     Weight (lb) < 200 lb (90.7 kg) (pt-stated)   224 lb 12.8 oz (102 kg)      Visit info / Clinical Intake: Medicare Wellness Visit Type:: Subsequent Annual Wellness Visit Persons participating in visit:: patient Medicare Wellness Visit Mode:: In-person (required for WTM) Information given by:: patient Interpreter Needed?: No Pre-visit prep was completed: yes AWV questionnaire completed by patient prior to visit?: yes Date:: 04/29/24 Living arrangements:: lives with spouse/significant other Patient's Overall Health Status Rating: very good Typical amount of pain: some Does pain affect daily life?: no Are you currently prescribed opioids?: no  Dietary Habits and Nutritional Risks How many meals a day?: 2 Eats fruit and vegetables daily?: yes Most meals are obtained by: preparing own meals In the last 2 weeks, have you had any of the following?: none Diabetic:: no  Functional Status Activities of Daily Living (to include ambulation/medication): Independent Ambulation: Independent with device- listed below Home Assistive Devices/Equipment: Eyeglasses Medication Administration: Independent Home Management: Independent Manage your own finances?: yes Primary transportation is: driving Concerns  about vision?: no *vision screening is required for WTM* Concerns about hearing?: no  Fall Screening Falls in the past year?: 0 Number of falls in past year: 0 Was there an injury with Fall?: 0 Fall Risk Category Calculator: 0 Patient Fall Risk Level: Low Fall Risk  Fall Risk Patient at Risk for Falls Due to: No Fall Risks Fall risk Follow up: Falls evaluation completed  Home and Transportation Safety: All rugs have non-skid backing?: yes All stairs or steps have railings?: yes Grab bars in the bathtub or shower?: yes Have non-skid surface in bathtub or shower?: yes Good home lighting?: yes Regular seat belt use?: yes Hospital stays in the last year:: no  Cognitive Assessment Difficulty concentrating, remembering, or making decisions? : no Will 6CIT or Mini Cog be Completed: yes What year is it?: 0 points What month is it?: 0 points Give patient an address phrase to remember (5 components): [address] [place] About what time is it?: 0 points Count backwards from 20 to 1: 0 points Say the months of the year in reverse: 0 points Repeat the address phrase from  earlier: 0 points 6 CIT Score: 0 points  Advance Directives (For Healthcare) Does Patient Have a Medical Advance Directive?: No Would patient like information on creating a medical advance directive?: No - Patient declined  Reviewed/Updated  Reviewed/Updated: Reviewed All (Medical, Surgical, Family, Medications, Allergies, Care Teams, Patient Goals)        Objective:    Today's Vitals   04/29/24 0846  BP: 120/62  Weight: 224 lb 12.8 oz (102 kg)  Height: 5' 1 (1.549 m)   Body mass index is 42.48 kg/m.  Current Medications (verified) Outpatient Encounter Medications as of 04/29/2024  Medication Sig   aspirin 81 MG tablet Take 81 mg by mouth daily.   atorvastatin  (LIPITOR) 40 MG tablet TAKE 1 TABLET BY MOUTH DAILY   Calcium  Carbonate (CALCIUM  600 PO) Take 1 tablet by mouth 2 (two) times daily.    Cholecalciferol (VITAMIN D3) 1000 UNITS CAPS Take 1 capsule by mouth daily.   docusate sodium (COLACE) 100 MG capsule Take 100 mg by mouth daily.   ezetimibe  (ZETIA ) 10 MG tablet Take 1 tablet (10 mg total) by mouth daily.   famotidine (PEPCID) 40 MG tablet Take 40 mg by mouth daily.   hydrochlorothiazide  (HYDRODIURIL ) 25 MG tablet Take 1 tablet (25 mg total) by mouth daily.   ibuprofen  (ADVIL ) 200 MG tablet Take 400 mg by mouth every 6 (six) hours as needed.   isosorbide mononitrate (IMDUR) 30 MG 24 hr tablet Take 30 mg by mouth daily.   isosorbide mononitrate (IMDUR) 60 MG 24 hr tablet TAKE 1 TABLET BY MOUTH ONCE DAILY   levothyroxine  (SYNTHROID ) 125 MCG tablet Take 1 tablet (125 mcg total) by mouth daily.   metoprolol  succinate (TOPROL -XL) 50 MG 24 hr tablet TAKE 1 TABLET BY MOUTH DAILY  WITH OR IMMEDIATELY FOLLOWING A  MEAL   montelukast  (SINGULAIR ) 10 MG tablet Take 1 tablet (10 mg total) by mouth at bedtime. NEEDS APPOINTMENT FOR FURTHER REFILLS   nitroGLYCERIN  (NITROSTAT ) 0.4 MG SL tablet Place 0.4 mg under the tongue every 5 (five) minutes as needed for chest pain.   vitamin B-12 (CYANOCOBALAMIN) 250 MCG tablet Take 500 mcg by mouth daily.    No facility-administered encounter medications on file as of 04/29/2024.   Hearing/Vision screen Hearing Screening - Comments:: Denies hearing loss  Vision Screening - Comments:: UTD @ [facility] [place] Immunizations and Health Maintenance Health Maintenance  Topic Date Due   COVID-19 Vaccine (5 - 2025-26 season) 02/09/2024   DTaP/Tdap/Td (3 - Tdap) 12/20/2024   Mammogram  04/21/2025   Medicare Annual Wellness (AWV)  04/29/2025   Colonoscopy  05/30/2025   Bone Density Scan  08/28/2025   Influenza Vaccine  Completed   Hepatitis C Screening  Completed   Zoster Vaccines- Shingrix  Completed   Meningococcal B Vaccine  Aged Out   Pneumococcal Vaccine: 50+ Years  Discontinued        Assessment/Plan:  This is a routine wellness  examination for [place].  Patient Care Team: Valerio Melanie DASEN, NP as PCP - General (Nurse Practitioner) [facility] (Optometry) Custovic, Sabina, DO as Referring Physician (Cardiology)  I have personally reviewed and noted the following in the patient's chart:   Medical and social history Use of alcohol, tobacco or illicit drugs  Current medications and supplements including opioid prescriptions. Functional ability and status Nutritional status Physical activity Advanced directives List of other physicians Hospitalizations, surgeries, and ER visits in previous 12 months Vitals Screenings to include cognitive, depression, and  falls Referrals and appointments  No orders of the defined types were placed in this encounter.  In addition, I have reviewed and discussed with patient certain preventive protocols, quality metrics, and best practice recommendations. A written personalized care plan for preventive services as well as general preventive health recommendations were provided to patient.   Vina Ned, CMA   04/29/2024   Return in 1 year (on 05/03/2025).  After Visit Summary: (In Person-Printed) AVS printed and given to the patient  Nurse Notes:  Declined Covid vaccine

## 2024-05-03 DIAGNOSIS — R1310 Dysphagia, unspecified: Secondary | ICD-10-CM | POA: Diagnosis not present

## 2024-05-03 DIAGNOSIS — Z853 Personal history of malignant neoplasm of breast: Secondary | ICD-10-CM | POA: Diagnosis not present

## 2024-05-03 DIAGNOSIS — Z08 Encounter for follow-up examination after completed treatment for malignant neoplasm: Secondary | ICD-10-CM | POA: Diagnosis not present

## 2024-05-12 DIAGNOSIS — E782 Mixed hyperlipidemia: Secondary | ICD-10-CM | POA: Diagnosis not present

## 2024-05-12 DIAGNOSIS — I252 Old myocardial infarction: Secondary | ICD-10-CM | POA: Diagnosis not present

## 2024-05-12 DIAGNOSIS — I25118 Atherosclerotic heart disease of native coronary artery with other forms of angina pectoris: Secondary | ICD-10-CM | POA: Diagnosis not present

## 2024-05-12 DIAGNOSIS — I10 Essential (primary) hypertension: Secondary | ICD-10-CM | POA: Diagnosis not present

## 2024-05-15 ENCOUNTER — Other Ambulatory Visit: Payer: Self-pay | Admitting: Nurse Practitioner

## 2024-05-15 DIAGNOSIS — E785 Hyperlipidemia, unspecified: Secondary | ICD-10-CM

## 2024-05-18 DIAGNOSIS — M5451 Vertebrogenic low back pain: Secondary | ICD-10-CM | POA: Diagnosis not present

## 2024-05-18 DIAGNOSIS — M5416 Radiculopathy, lumbar region: Secondary | ICD-10-CM | POA: Diagnosis not present

## 2024-05-20 DIAGNOSIS — M5451 Vertebrogenic low back pain: Secondary | ICD-10-CM | POA: Diagnosis not present

## 2024-05-20 DIAGNOSIS — M5416 Radiculopathy, lumbar region: Secondary | ICD-10-CM | POA: Diagnosis not present

## 2024-09-09 ENCOUNTER — Encounter: Admitting: Nurse Practitioner

## 2024-09-13 ENCOUNTER — Encounter: Admitting: Nurse Practitioner

## 2024-09-20 ENCOUNTER — Ambulatory Visit: Admitting: Nurse Practitioner

## 2024-09-23 ENCOUNTER — Encounter: Admitting: Nurse Practitioner

## 2025-05-03 ENCOUNTER — Ambulatory Visit
